# Patient Record
Sex: Female | Born: 1995 | Race: Black or African American | Hispanic: No | Marital: Single | Smoking: Former smoker
Health system: Southern US, Community
[De-identification: ages and names within clinical notes are randomized; demographics above are authoritative.]

## PROBLEM LIST (undated history)

## (undated) ENCOUNTER — Inpatient Hospital Stay (HOSPITAL_COMMUNITY): Payer: Self-pay

## (undated) DIAGNOSIS — F32A Depression, unspecified: Secondary | ICD-10-CM

## (undated) DIAGNOSIS — E039 Hypothyroidism, unspecified: Secondary | ICD-10-CM

## (undated) DIAGNOSIS — F419 Anxiety disorder, unspecified: Secondary | ICD-10-CM

## (undated) DIAGNOSIS — G5600 Carpal tunnel syndrome, unspecified upper limb: Secondary | ICD-10-CM

## (undated) DIAGNOSIS — B9689 Other specified bacterial agents as the cause of diseases classified elsewhere: Secondary | ICD-10-CM

## (undated) DIAGNOSIS — O24419 Gestational diabetes mellitus in pregnancy, unspecified control: Secondary | ICD-10-CM

## (undated) DIAGNOSIS — N760 Acute vaginitis: Secondary | ICD-10-CM

## (undated) DIAGNOSIS — L02411 Cutaneous abscess of right axilla: Secondary | ICD-10-CM

## (undated) DIAGNOSIS — B009 Herpesviral infection, unspecified: Secondary | ICD-10-CM

## (undated) DIAGNOSIS — Z8639 Personal history of other endocrine, nutritional and metabolic disease: Secondary | ICD-10-CM

## (undated) HISTORY — DX: Hypothyroidism, unspecified: E03.9

## (undated) HISTORY — DX: Herpesviral infection, unspecified: B00.9

## (undated) HISTORY — DX: Depression, unspecified: F32.A

## (undated) HISTORY — DX: Anxiety disorder, unspecified: F41.9

## (undated) HISTORY — DX: Gestational diabetes mellitus in pregnancy, unspecified control: O24.419

## (undated) HISTORY — DX: Other specified bacterial agents as the cause of diseases classified elsewhere: N76.0

## (undated) HISTORY — PX: NO PAST SURGERIES: SHX2092

## (undated) HISTORY — DX: Other specified bacterial agents as the cause of diseases classified elsewhere: B96.89

## (undated) HISTORY — DX: Cutaneous abscess of right axilla: L02.411

---
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
## 2015-08-03 ENCOUNTER — Encounter (HOSPITAL_COMMUNITY): Payer: Self-pay

## 2015-08-03 DIAGNOSIS — Y999 Unspecified external cause status: Secondary | ICD-10-CM | POA: Insufficient documentation

## 2015-08-03 DIAGNOSIS — S50812A Abrasion of left forearm, initial encounter: Secondary | ICD-10-CM | POA: Insufficient documentation

## 2015-08-03 DIAGNOSIS — Y929 Unspecified place or not applicable: Secondary | ICD-10-CM | POA: Insufficient documentation

## 2015-08-03 DIAGNOSIS — S60511A Abrasion of right hand, initial encounter: Secondary | ICD-10-CM | POA: Diagnosis not present

## 2015-08-03 DIAGNOSIS — Z87891 Personal history of nicotine dependence: Secondary | ICD-10-CM | POA: Diagnosis not present

## 2015-08-03 DIAGNOSIS — Y939 Activity, unspecified: Secondary | ICD-10-CM | POA: Diagnosis not present

## 2015-08-03 DIAGNOSIS — Z3A14 14 weeks gestation of pregnancy: Secondary | ICD-10-CM | POA: Diagnosis not present

## 2015-08-03 DIAGNOSIS — O9A212 Injury, poisoning and certain other consequences of external causes complicating pregnancy, second trimester: Secondary | ICD-10-CM | POA: Diagnosis present
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
## 2015-08-04 ENCOUNTER — Emergency Department (HOSPITAL_COMMUNITY)
Admission: EM | Admit: 2015-08-04 | Discharge: 2015-08-04 | Disposition: A | Discharge status: Home / Self Care | Payer: Medicaid Other | Attending: Emergency Medicine | Admitting: Emergency Medicine

## 2015-08-04 DIAGNOSIS — Z3492 Encounter for supervision of normal pregnancy, unspecified, second trimester: Secondary | ICD-10-CM
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
## 2015-08-04 NOTE — ED Notes (Signed)
EDP at bedside  

## 2016-01-18 DIAGNOSIS — O365990 Maternal care for other known or suspected poor fetal growth, unspecified trimester, not applicable or unspecified: Secondary | ICD-10-CM

## 2016-02-25 NOTE — L&D Delivery Note (Signed)
Delivery Note At 6:15 PM a viable female was delivered via Vaginal, Spontaneous Delivery (Presentation:LOA ; cephalic ).  APGAR: 8, 9; weight  pending.   Placenta status: complete , 3 vessel cord .  Cord:  with the following complications: none.  Cord pH: pending  Anesthesia:  IV pain meds Episiotomy: None Lacerations: Perineal Suture Repair: 3.0 vicryl Est. Blood Loss (mL): 300  Mom to postpartum.  Baby to Couplet care / Skin to Skin.  Dyke Maes Cimolino PA-S 12/04/2016, 7:43 PM  VAGINAL DELIVERY ATTESTATION  I was gloved and present for the delivery in its entirety, and I agree with the above resident's note.    Raelyn Mora, CNM 7:45 PM

## 2016-06-10 ENCOUNTER — Encounter (HOSPITAL_COMMUNITY): Payer: Self-pay

## 2016-06-10 ENCOUNTER — Inpatient Hospital Stay (HOSPITAL_COMMUNITY): Payer: Medicaid Other

## 2016-06-10 ENCOUNTER — Inpatient Hospital Stay (HOSPITAL_COMMUNITY)
Admission: AD | Admit: 2016-06-10 | Discharge: 2016-06-10 | Disposition: A | Discharge status: Home / Self Care | Payer: Medicaid Other | Source: Ambulatory Visit | Attending: Family Medicine | Admitting: Family Medicine

## 2016-06-10 DIAGNOSIS — Z3A13 13 weeks gestation of pregnancy: Secondary | ICD-10-CM

## 2016-06-10 DIAGNOSIS — O23591 Infection of other part of genital tract in pregnancy, first trimester: Secondary | ICD-10-CM | POA: Diagnosis not present

## 2016-06-10 DIAGNOSIS — B9689 Other specified bacterial agents as the cause of diseases classified elsewhere: Secondary | ICD-10-CM | POA: Insufficient documentation

## 2016-06-10 DIAGNOSIS — N760 Acute vaginitis: Secondary | ICD-10-CM

## 2016-06-10 DIAGNOSIS — O209 Hemorrhage in early pregnancy, unspecified: Secondary | ICD-10-CM | POA: Diagnosis present

## 2016-06-10 HISTORY — DX: Personal history of other endocrine, nutritional and metabolic disease: Z86.39

## 2016-06-10 LAB — URINALYSIS, ROUTINE W REFLEX MICROSCOPIC
BILIRUBIN URINE: NEGATIVE
Glucose, UA: NEGATIVE mg/dL
KETONES UR: NEGATIVE mg/dL
LEUKOCYTES UA: NEGATIVE
Nitrite: NEGATIVE
Protein, ur: NEGATIVE mg/dL
Specific Gravity, Urine: 1.027 (ref 1.005–1.030)
WBC, UA: NONE SEEN WBC/hpf (ref 0–5)
pH: 7 (ref 5.0–8.0)

## 2016-06-10 LAB — WET PREP, GENITAL
SPERM: NONE SEEN
Trich, Wet Prep: NONE SEEN
YEAST WET PREP: NONE SEEN

## 2016-06-10 LAB — POCT PREGNANCY, URINE: PREG TEST UR: POSITIVE — AB

## 2016-06-10 MED ORDER — METRONIDAZOLE 500 MG PO TABS: 500.0000 mg | ORAL_TABLET | Freq: Two times a day (BID) | ORAL | 0 refills | Status: DC

## 2016-06-10 NOTE — Discharge Instructions (Signed)
Bacterial Vaginosis °Bacterial vaginosis is a vaginal infection that occurs when the normal balance of bacteria in the vagina is disrupted. It results from an overgrowth of certain bacteria. This is the most common vaginal infection among women ages 15-44. °Because bacterial vaginosis increases your risk for STIs (sexually transmitted infections), getting treated can help reduce your risk for chlamydia, gonorrhea, herpes, and HIV (human immunodeficiency virus). Treatment is also important for preventing complications in pregnant women, because this condition can cause an early (premature) delivery. °What are the causes? °This condition is caused by an increase in harmful bacteria that are normally present in small amounts in the vagina. However, the reason that the condition develops is not fully understood. °What increases the risk? °The following factors may make you more likely to develop this condition: °· Having a new sexual partner or multiple sexual partners. °· Having unprotected sex. °· Douching. °· Having an intrauterine device (IUD). °· Smoking. °· Drug and alcohol abuse. °· Taking certain antibiotic medicines. °· Being pregnant. °You cannot get bacterial vaginosis from toilet seats, bedding, swimming pools, or contact with objects around you. °What are the signs or symptoms? °Symptoms of this condition include: °· Grey or white vaginal discharge. The discharge can also be watery or foamy. °· A fish-like odor with discharge, especially after sexual intercourse or during menstruation. °· Itching in and around the vagina. °· Burning or pain with urination. °Some women with bacterial vaginosis have no signs or symptoms. °How is this diagnosed? °This condition is diagnosed based on: °· Your medical history. °· A physical exam of the vagina. °· Testing a sample of vaginal fluid under a microscope to look for a large amount of bad bacteria or abnormal cells. Your health care provider may use a cotton swab or a  small wooden spatula to collect the sample. °How is this treated? °This condition is treated with antibiotics. These may be given as a pill, a vaginal cream, or a medicine that is put into the vagina (suppository). If the condition comes back after treatment, a second round of antibiotics may be needed. °Follow these instructions at home: °Medicines  °· Take over-the-counter and prescription medicines only as told by your health care provider. °· Take or use your antibiotic as told by your health care provider. Do not stop taking or using the antibiotic even if you start to feel better. °General instructions  °· If you have a female sexual partner, tell her that you have a vaginal infection. She should see her health care provider and be treated if she has symptoms. If you have a female sexual partner, he does not need treatment. °· During treatment: °¨ Avoid sexual activity until you finish treatment. °¨ Do not douche. °¨ Avoid alcohol as directed by your health care provider. °¨ Avoid breastfeeding as directed by your health care provider. °· Drink enough water and fluids to keep your urine clear or pale yellow. °· Keep the area around your vagina and rectum clean. °¨ Wash the area daily with warm water. °¨ Wipe yourself from front to back after using the toilet. °· Keep all follow-up visits as told by your health care provider. This is important. °How is this prevented? °· Do not douche. °· Wash the outside of your vagina with warm water only. °· Use protection when having sex. This includes latex condoms and dental dams. °· Limit how many sexual partners you have. To help prevent bacterial vaginosis, it is best to have sex with just one   partner (monogamous).  Make sure you and your sexual partner are tested for STIs.  Wear cotton or cotton-lined underwear.  Avoid wearing tight pants and pantyhose, especially during summer.  Limit the amount of alcohol that you drink.  Do not use any products that contain  nicotine or tobacco, such as cigarettes and e-cigarettes. If you need help quitting, ask your health care provider.  Do not use illegal drugs. Where to find more information:  Centers for Disease Control and Prevention: SolutionApps.co.za  American Sexual Health Association (ASHA): www.ashastd.org  U.S. Department of Health and Health and safety inspector, Office on Women's Health: ConventionalMedicines.si or http://www.anderson-williamson.info/ Contact a health care provider if:  Your symptoms do not improve, even after treatment.  You have more discharge or pain when urinating.  You have a fever.  You have pain in your abdomen.  You have pain during sex.  You have vaginal bleeding between periods. Summary  Bacterial vaginosis is a vaginal infection that occurs when the normal balance of bacteria in the vagina is disrupted.  Because bacterial vaginosis increases your risk for STIs (sexually transmitted infections), getting treated can help reduce your risk for chlamydia, gonorrhea, herpes, and HIV (human immunodeficiency virus). Treatment is also important for preventing complications in pregnant women, because the condition can cause an early (premature) delivery.  This condition is treated with antibiotic medicines. These may be given as a pill, a vaginal cream, or a medicine that is put into the vagina (suppository). This information is not intended to replace advice given to you by your health care provider. Make sure you discuss any questions you have with your health care provider. Document Released: 02/10/2005 Document Revised: 10/27/2015 Document Reviewed: 10/27/2015 Elsevier Interactive Patient Education  2017 Elsevier Inc.     Vaginal Bleeding During Pregnancy, First Trimester A small amount of bleeding (spotting) from the vagina is relatively common in early pregnancy. It usually stops on its own. Various things may cause bleeding or spotting in early pregnancy.  Some bleeding may be related to the pregnancy, and some may not. In most cases, the bleeding is normal and is not a problem. However, bleeding can also be a sign of something serious. Be sure to tell your health care provider about any vaginal bleeding right away. Some possible causes of vaginal bleeding during the first trimester include:  Infection or inflammation of the cervix.  Growths (polyps) on the cervix.  Miscarriage or threatened miscarriage.  Pregnancy tissue has developed outside of the uterus and in a fallopian tube (tubal pregnancy).  Tiny cysts have developed in the uterus instead of pregnancy tissue (molar pregnancy). Follow these instructions at home: Watch your condition for any changes. The following actions may help to lessen any discomfort you are feeling:  Follow your health care provider's instructions for limiting your activity. If your health care provider orders bed rest, you may need to stay in bed and only get up to use the bathroom. However, your health care provider may allow you to continue light activity.  If needed, make plans for someone to help with your regular activities and responsibilities while you are on bed rest.  Keep track of the number of pads you use each day, how often you change pads, and how soaked (saturated) they are. Write this down.  Do not use tampons. Do not douche.  Do not have sexual intercourse or orgasms until approved by your health care provider.  If you pass any tissue from your vagina, save the tissue so you  can show it to your health care provider.  Only take over-the-counter or prescription medicines as directed by your health care provider.  Do not take aspirin because it can make you bleed.  Keep all follow-up appointments as directed by your health care provider. Contact a health care provider if:  You have any vaginal bleeding during any part of your pregnancy.  You have cramps or labor pains.  You have a  fever, not controlled by medicine. Get help right away if:  You have severe cramps in your back or belly (abdomen).  You pass large clots or tissue from your vagina.  Your bleeding increases.  You feel light-headed or weak, or you have fainting episodes.  You have chills.  You are leaking fluid or have a gush of fluid from your vagina.  You pass out while having a bowel movement. This information is not intended to replace advice given to you by your health care provider. Make sure you discuss any questions you have with your health care provider. Document Released: 11/20/2004 Document Revised: 07/19/2015 Document Reviewed: 10/18/2012 Elsevier Interactive Patient Education  2017 [employer].

## 2016-06-10 NOTE — MAU Note (Addendum)
Pt has been having pink or reddish bleeding on and off. Has also been having pelvic pain but currently not having pain. Pt reports being pregnant and having a baby 4 months ago.

## 2016-06-10 NOTE — MAU Provider Note (Signed)
History     CSN: [id number]  Arrival date and time: 06/10/16 4010  First Provider Initiated Contact with Patient 06/10/16 1033   Chief Complaint  Patient presents with  . Vaginal Bleeding   HPI  Alyssa Hartman is a 21 y.o. G2P1001 at [redacted]w[redacted]d by LMP who presents with vaginal bleeding & abdominal pain. Symptoms began 2 days ago. Reports pelvic pain that has occurred twice in the last 2 days and lasted for seconds at a time. Both times pain occurred when she was trying to get out of bed. Currently no pain.  Has noted pink/red spotting on toilet paper & has seen 2 pea-sized blood clots since yesterday. Some nausea. Denies vomiting, diarrhea, constipation, dysuria, or recent intercourse.  Has initial OB appointment at [facility] 5/10.   OB History    Gravida Para Term Preterm AB Living   0   1   SAB TAB Ectopic Multiple Live Births           1      Past Medical History:  Diagnosis Date  . Hx of Hashimoto thyroiditis     Past Surgical History:  Procedure Laterality Date  . NO PAST SURGERIES      History reviewed. No pertinent family history.  Social History  Substance Use Topics  . Smoking status: Former Games developer  . Smokeless tobacco: Never Used  . Alcohol use No    Allergies: No Known Allergies  No prescriptions prior to admission.    Review of Systems  Constitutional: Negative.   Gastrointestinal: Positive for abdominal pain and nausea. Negative for constipation, diarrhea and vomiting.  Genitourinary: Positive for vaginal bleeding and vaginal discharge. Negative for dyspareunia and dysuria.   Physical Exam   Blood pressure 115/69, pulse 93, temperature 98.7 F (37.1 C), resp. rate 18, height  (1.575 m), weight 230 lb (104.3 kg), last menstrual period 03/07/2016, unknown if currently breastfeeding.  Physical Exam  Nursing note and vitals reviewed. Constitutional: She is oriented to person, place, and time. She appears well-developed and well-nourished. No  distress.  HENT:  Head: Normocephalic and atraumatic.  Eyes: Conjunctivae are normal. Right eye exhibits no discharge. Left eye exhibits no discharge. No scleral icterus.  Neck: Normal range of motion.  Cardiovascular: Normal rate, regular rhythm and normal heart sounds.   No murmur heard. Respiratory: Effort normal and breath sounds normal. No respiratory distress. She has no wheezes.  GI: Soft. Bowel sounds are normal. She exhibits no distension. There is no tenderness. There is no rebound and no guarding.  Genitourinary: Uterus is enlarged. Cervix exhibits no friability. There is bleeding (minimal amount of dark red blood) in the vagina.  Genitourinary Comments: Cervix closed  Neurological: She is alert and oriented to person, place, and time.  Skin: Skin is warm and dry. She is not diaphoretic.  Psychiatric: She has a normal mood and affect. Her behavior is normal. Judgment and thought content normal.    MAU Course  Procedures Results for orders placed or performed during the hospital encounter of 06/10/16 (from the past 24 hour(s))  Urinalysis, Routine w reflex microscopic (not at [facility])     Status: Abnormal   Collection Time: 06/10/16 10:05 AM  Result Value Ref Range   Color, Urine YELLOW YELLOW   APPearance HAZY (A) CLEAR   Specific Gravity, Urine 1.027 1.005 - 1.030   pH 7.0 5.0 - 8.0   Glucose, UA NEGATIVE NEGATIVE mg/dL   Hgb urine dipstick SMALL (A) NEGATIVE  Bilirubin Urine NEGATIVE NEGATIVE   Ketones, ur NEGATIVE NEGATIVE mg/dL   Protein, ur NEGATIVE NEGATIVE mg/dL   Nitrite NEGATIVE NEGATIVE   Leukocytes, UA NEGATIVE NEGATIVE   RBC / HPF 0-5 0 - 5 RBC/hpf   WBC, UA NONE SEEN 0 - 5 WBC/hpf   Bacteria, UA RARE (A) NONE SEEN   Squamous Epithelial / LPF 6-30 (A) NONE SEEN   Mucous PRESENT   Pregnancy, urine POC     Status: Abnormal   Collection Time: 06/10/16 10:14 AM  Result Value Ref Range   Preg Test, Ur POSITIVE (A) NEGATIVE  Wet prep, genital     Status:  Abnormal   Collection Time: 06/10/16 10:44 AM  Result Value Ref Range   Yeast Wet Prep HPF POC NONE SEEN NONE SEEN   Trich, Wet Prep NONE SEEN NONE SEEN   Clue Cells Wet Prep HPF POC PRESENT (A) NONE SEEN   WBC, Wet Prep HPF POC MANY (A) NONE SEEN   Sperm NONE SEEN    No results found.   MDM FHT 158 A positive (per care everywhere) GC/CT & wet prep Ultrasound -- no SCH  Assessment and Plan  A: 1. BV (bacterial vaginosis)   2. [redacted] weeks gestation of pregnancy   3. Vaginal bleeding in pregnancy, first trimester    P: Discharge home Rx flagyl Pelvic rest Discussed reasons to return to MAU Keep follow up appointment with OB/PCP  GC/CT pending   Judeth Horn 06/10/2016, 10:32 AM

## 2016-06-11 LAB — GC/CHLAMYDIA PROBE AMP (~~LOC~~) NOT AT ARMC
Chlamydia: NEGATIVE
Neisseria Gonorrhea: NEGATIVE

## 2016-07-07 LAB — OB RESULTS CONSOLE GC/CHLAMYDIA
CHLAMYDIA, DNA PROBE: NEGATIVE
GC PROBE AMP, GENITAL: NEGATIVE

## 2016-07-07 LAB — OB RESULTS CONSOLE HIV ANTIBODY (ROUTINE TESTING): HIV: NONREACTIVE

## 2016-07-07 LAB — OB RESULTS CONSOLE RUBELLA ANTIBODY, IGM: Rubella: IMMUNE

## 2016-07-07 LAB — OB RESULTS CONSOLE ABO/RH: RH Type: POSITIVE

## 2016-07-07 LAB — OB RESULTS CONSOLE HEPATITIS B SURFACE ANTIGEN: Hepatitis B Surface Ag: NEGATIVE

## 2016-07-07 LAB — OB RESULTS CONSOLE HGB/HCT, BLOOD
HCT: 35 %
HEMOGLOBIN: 11.2 g/dL

## 2016-07-07 LAB — OB RESULTS CONSOLE RPR: RPR: NONREACTIVE

## 2016-07-07 LAB — OB RESULTS CONSOLE PLATELET COUNT: Platelets: 206 10*3/uL

## 2016-07-07 LAB — OB RESULTS CONSOLE TSH: TSH: 2.66

## 2016-07-07 LAB — OB RESULTS CONSOLE ANTIBODY SCREEN: ANTIBODY SCREEN: NEGATIVE

## 2016-07-07 LAB — OB RESULTS CONSOLE VARICELLA ZOSTER ANTIBODY, IGG: VARICELLA IGG: IMMUNE

## 2016-07-08 ENCOUNTER — Other Ambulatory Visit (HOSPITAL_COMMUNITY): Payer: Self-pay | Admitting: Nurse Practitioner

## 2016-07-08 DIAGNOSIS — Z3689 Encounter for other specified antenatal screening: Secondary | ICD-10-CM

## 2016-07-22 ENCOUNTER — Other Ambulatory Visit (HOSPITAL_COMMUNITY): Payer: Self-pay | Admitting: *Deleted

## 2016-07-22 ENCOUNTER — Ambulatory Visit (HOSPITAL_COMMUNITY)
Admission: RE | Admit: 2016-07-22 | Discharge: 2016-07-22 | Disposition: A | Discharge status: Home / Self Care | Payer: Medicaid Other | Source: Ambulatory Visit | Attending: Nurse Practitioner | Admitting: Nurse Practitioner

## 2016-07-22 ENCOUNTER — Encounter (HOSPITAL_COMMUNITY): Payer: Self-pay

## 2016-07-22 DIAGNOSIS — Z3689 Encounter for other specified antenatal screening: Secondary | ICD-10-CM | POA: Diagnosis present

## 2016-07-22 DIAGNOSIS — O99212 Obesity complicating pregnancy, second trimester: Secondary | ICD-10-CM | POA: Diagnosis not present

## 2016-07-22 DIAGNOSIS — O09299 Supervision of pregnancy with other poor reproductive or obstetric history, unspecified trimester: Secondary | ICD-10-CM | POA: Insufficient documentation

## 2016-07-22 DIAGNOSIS — O352XX0 Maternal care for (suspected) hereditary disease in fetus, not applicable or unspecified: Secondary | ICD-10-CM | POA: Insufficient documentation

## 2016-07-22 DIAGNOSIS — O09892 Supervision of other high risk pregnancies, second trimester: Secondary | ICD-10-CM | POA: Insufficient documentation

## 2016-07-22 DIAGNOSIS — Z363 Encounter for antenatal screening for malformations: Secondary | ICD-10-CM | POA: Diagnosis not present

## 2016-07-22 DIAGNOSIS — Z3A19 19 weeks gestation of pregnancy: Secondary | ICD-10-CM | POA: Insufficient documentation

## 2016-07-22 DIAGNOSIS — Z8489 Family history of other specified conditions: Secondary | ICD-10-CM | POA: Insufficient documentation

## 2016-07-22 NOTE — Progress Notes (Signed)
Genetic Counseling  High-Risk Gestation Note  Appointment Date:  07/22/2016 Referred By: Alyssa Bloom, NP Date of Birth:  Sep 06, 1995 Partner:  Alyssa Hartman   Pregnancy History: [id number] Estimated Date of Delivery: 12/12/16 Estimated Gestational Age: 18w4dAttending: MGriffin Dakin MD   I met with [name] genetic counseling because of a family history of Branchioororenal (BOR) syndrome. [name] [facility] and was seen for genetic counseling during that pregnancy on 10/16/15 and 10/23/15 given the ultrasound finding in that pregnancy and the reported family history.   In summary:  Discussed family history of reported BOR syndrome  Father of the baby and couple's son both with features possibly consistent with BOR syndrome  [place]'s [place] [facility] Reviewed autosomal dominant inheritance; 1 in 2 (50%) recurrence risk  Discussed options of screening / testing  Detailed ultrasound- performed today  Amniocentesis- patient declined; would not be informative unless molecular testing first identified pathogenic change for affected family members  Postnatal evaluation for associated features of BOR  Discussed general population carrier screening options-declined  CF  SMA  Hemoglobinopathies  We began by reviewing and updating the family history. The father of the pregnancy, Alyssa Hartman was previously reported to have branchio-oto-renal syndrome per his mother's report. This information was previously discussed and was not reviewed in detail today. The patient reported no changes to the medical history for [name] for herself. Ms. AGwen Herreported that their son, Alyssa Hartman is currently 660 monthsold. The patient reported that he was noted at birth to have branchial cleft and is planned to have surgical closure. He also  reportedly has preauricular pits and is followed by urology. [name] that he did not score a response on his newborn hearing screening, but she had limited information regarding this at this time. Additional available medical documentation indicated that [facility] for genes associated with BOR syndrome; results are currently pending.  The patient reported no additional updates to either family history.    We reviewed that branchiootorenal spectrum disorders are described to include branchiootorenal (BOR) syndrome and branchiootic syndrome (BOS). BOR syndrome is characterized by branchial fistulae and cysts in ~50% of individuals, otologic findings in 90% (deafness, preauricular pits, auricular malformations, abnormalities of external auditory canal, middle ear and inner ear abnormalities), and renal anomalies (renal agenesis, hypoplasia, dysplasia; UPJ obstruction; calyceal cyst/diverticulum; hydronephrosis; vesicoureteral reflux). BOS is considered in the absence of structural renal anomalies. BOR syndrome is diagnosed based on clinical criteria, and a diagnosis is based on the presence of three or more major criteria or two major and two minor clinical criteria (Radene Kneeet al. 2004). For individuals with an affected family member, the presence of one major clinical criteria is considered sufficient to make a clinical diagnosis. At-risk relatives (first degree relatives of affected individuals) should be screened for hearing loss and renal involvement to allow for early diagnosis and treatment. Management includes excision of branchial cleft cysts or fistulae if present and treatment of urologic and renal abnormalities. For otologic anomalies, fitting with appropriate aural habilitation and appropriate educational program for the hearing impaired, as well as considering canaloplasty to correct atretic canal.  In the presence of renal anomalies, an increased risk for end-stage renal disease (ESRD) has been described, and individuals are routinely monitored  for signs of ESRD.   We spent time reviewing genes, chromosomes, and autosomal dominant inheritance, given that BOR syndrome follows autosomal dominant inheritance. In autosomal dominant inheritance, a nonworking gene variant in a pair causes the particular condition. Each offspring of an affected individual has a 1 in 2 (50%) chance to inherit the autosomal dominant condition. In BOR syndrome, 100% penetrance is reported, meaning individuals who inherit the nonworking gene variant would be expected to have the condition. However, variable expressivity is observed, meaning that the exact symptoms and features of the condition can range, often among family members with BOR syndrome. Thus, the presence of features in one relative with BOR syndrome would not necessarily predict the presentation of features in another relatives with BOR syndrome. There are currently three genes identified that can be causative of BOR syndrome. Clinical molecular testing is available for these genes. However, we discussed that not all individuals with a clinical diagnosis of BOR syndrome have identified genetic causes in these genes. EYA1 gene is estimated to account for 40% of individual with BOR syndrome, SIX5 accounts for approximately 2.5%, and SIX1 gene accounts for approximately 2%. Thus, the molecular cause for BOR syndrome in approximately half of individuals with a clinical diagnosis is not currently known. If a molecular cause was identified in an individual, genetic testing could be performed for at risk relatives, and in the case of a pregnancy, prenatal diagnosis. We reviewed the risks, benefits, and limitations of amniocentesis, including the associated 1 in [phone] risk for complications. Genetic testing has not been performed for Alyssa Hartman, to the couple's knowledge.  Prenatal molecular testing via amniocentesis would not be expected to be informative unless molecular testing in Alyssa Hartman and/or Alyssa Hartman was first performed and identified a causative gene change. Alyssa Hartman stated that she would not be interested in pursuing amniocentesis in a pregnancy, even if a molecular cause were to be identified for Alyssa Hartman and/or Mr. Alyssa Hartman condition.   We discussed the option of targeted ultrasound to assess fetal anatomy, and specifically fetal kidneys in detail. Ultrasound was performed today. Complete ultrasound result under separate cover. She understands that ultrasound cannot diagnose or rule out all genetic conditions.   We reviewed that [place] is typically diagnosed on clinical criteria. In the case of an affected family member, the presence of one major clinical criteria is considered to meet clinical criteria for diagnosis. Given the reported family history, recurrence risk for BOR in the current pregnancy would be 1 in 2 (50%); this would also be in the recurrence risk in the case that a different underlying autosomal dominant condition is the cause for their features. In the case of a different underlying etiology, recurrence risk estimate and possible associated medical management for affected individuals may change. The patient is aware of the option of medical genetics evaluation for this baby postnatally, as well as postnatal subspecialty evaluations, if warranted, such as peds urology, pediatric surgery, and audiology. Without further information regarding the provided family history, an accurate genetic risk cannot be calculated. Further genetic counseling is warranted if more information is obtained.  Alyssa Hartman was provided with written information regarding cystic fibrosis (CF), spinal muscular atrophy (SMA) and hemoglobinopathies including the carrier frequency, availability of carrier screening and prenatal diagnosis if indicated.  In  addition, we discussed that CF and hemoglobinopathies are routinely screened for as part of the  newborn screening panel.  She declined screening for CF, SMA and hemoglobinopathies.  Alyssa Hartman denied  exposure to environmental toxins or chemical agents. She denied the use of alcohol, tobacco or street drugs. She denied significant viral illnesses during the course of Hartman pregnancy. Hartman medical and surgical histories were contributory for bipolar disorder and attention deficit hyperactivity disorder (ADHD), and she reported that she has not required medication for treatment of these conditions during pregnancy.   I counseled Alyssa Hartman regarding the above risks and available options.  The approximate face-to-face time with the genetic counselor was 25 minutes.  Chipper Oman, MS Certified Genetic Counselor 07/22/2016

## 2016-07-22 NOTE — Addendum Note (Signed)
Encounter addended by: Levonne HubertStalter, Jayel Scaduto M on: 07/22/2016 11:31 AM<BR>    Actions taken: Imaging Exam ended

## 2016-07-24 ENCOUNTER — Encounter: Payer: Self-pay | Admitting: *Deleted

## 2016-07-28 ENCOUNTER — Ambulatory Visit (INDEPENDENT_AMBULATORY_CARE_PROVIDER_SITE_OTHER): Payer: Medicaid Other | Admitting: Obstetrics & Gynecology

## 2016-07-28 ENCOUNTER — Encounter: Payer: Self-pay | Admitting: Obstetrics & Gynecology

## 2016-07-28 VITALS — BP 121/59 | HR 92 | Wt 236.6 lb

## 2016-07-28 DIAGNOSIS — O99212 Obesity complicating pregnancy, second trimester: Secondary | ICD-10-CM | POA: Diagnosis not present

## 2016-07-28 DIAGNOSIS — O359XX0 Maternal care for (suspected) fetal abnormality and damage, unspecified, not applicable or unspecified: Secondary | ICD-10-CM

## 2016-07-28 DIAGNOSIS — E063 Autoimmune thyroiditis: Secondary | ICD-10-CM

## 2016-07-28 DIAGNOSIS — F319 Bipolar disorder, unspecified: Secondary | ICD-10-CM

## 2016-07-28 DIAGNOSIS — O0992 Supervision of high risk pregnancy, unspecified, second trimester: Secondary | ICD-10-CM

## 2016-07-28 DIAGNOSIS — Z6841 Body Mass Index (BMI) 40.0 and over, adult: Secondary | ICD-10-CM | POA: Diagnosis not present

## 2016-07-28 DIAGNOSIS — O0990 Supervision of high risk pregnancy, unspecified, unspecified trimester: Secondary | ICD-10-CM | POA: Insufficient documentation

## 2016-07-28 LAB — POCT URINALYSIS DIP (DEVICE)
Bilirubin Urine: NEGATIVE
GLUCOSE, UA: NEGATIVE mg/dL
Hgb urine dipstick: NEGATIVE
Ketones, ur: NEGATIVE mg/dL
LEUKOCYTES UA: NEGATIVE
NITRITE: NEGATIVE
PROTEIN: 30 mg/dL — AB
Specific Gravity, Urine: >=1.03 (ref 1.005–1.030)
UROBILINOGEN UA: 0.2 mg/dL (ref 0.0–1.0)
pH: 6.5 (ref 5.0–8.0)

## 2016-07-28 NOTE — Progress Notes (Signed)
Patient compain of having Cramps in hips and thigh off and on every other day, pain at a level 7. Patient PHQ-9 is high refused to see Alyssa Hartman she already see a Veterinary surgeoncounselor.    PRENATAL VISIT NOTE  Subjective:  Alyssa Hartman is a 21 y.o. G3P1001 at Unknown being seen today for ongoing prenatal care.  She is currently monitored for the following issues for this high-risk pregnancy and has Supervision of high risk pregnancy in second trimester; Bipolar 1 disorder (HCC); Hashimoto's thyroiditis; Obesity; and Suspected abnormality of fetus affecting management of mother in singleton pregnancy on her problem list.  Patient reports hip pain adn hard to get comfortable at night.  Contractions: Not present. Vag. Bleeding: None.  Movement: Present. Denies leaking of fluid.   The following portions of the patient's history were reviewed and updated as appropriate: allergies, current medications, past family history, past medical history, past social history, past surgical history and problem list. Problem list updated.  Objective:   Vitals:   07/28/16 0940  BP: (!) 121/59  Pulse: 92  Weight: 236 lb 9.6 oz (107.3 kg)    Fetal Status: Fetal Heart Rate (bpm): 150   Movement: Present     General:  Alert, oriented and cooperative. Patient is in no acute distress.  Skin: Skin is warm and dry. No rash noted.   Cardiovascular: Normal heart rate noted  Respiratory: Normal respiratory effort, no problems with respiration noted  Abdomen: Soft, gravid, appropriate for gestational age. Pain/Pressure: Present     Pelvic:  Cervical exam deferred        Extremities: Normal range of motion.  Edema: Trace  Mental Status: Normal mood and affect. Normal behavior. Normal judgment and thought content.   Assessment and Plan:  Pregnancy: G3P1001 at Unknown  1. Supervision of high risk pregnancy in second trimester -[facility]  2. Hashimoto's thyroiditis TSH in May  was nml (hig hnml); will recheck TSH in July 2018; pt not any med.  3. Class 3 severe obesity due to excess calories without serious comorbidity with body mass index (BMI) of 40.0 to 44.9 in adult [facility](HCC) -early one hour nml -Saw nutritionist at [facility] -12-20 pound weight gain  4. Suspected abnormality of fetus affecting management of mother in singleton pregnancy -BOS of FOB and first baby -Refuses genetic testing -Monitor for IUGR  5. Bipolar 1 disorder (HCC) -Sees counselor (refuses to see Alyssa Hartman today) -Not on meds -No HI or SI -Has open CPS case with first baby  Preterm labor symptoms and general obstetric precautions including but not limited to vaginal bleeding, contractions, leaking of fluid and fetal movement were reviewed in detail with the patient. Please refer to After Visit Summary for other counseling recommendations.  Return in about 3 weeks (around 08/18/2016).  30 minutes spent face to face with patient with >50% counseling  Alyssa LincolnKelly Leggett, MD

## 2016-08-04 ENCOUNTER — Encounter: Payer: Self-pay | Admitting: Obstetrics and Gynecology

## 2016-08-04 DIAGNOSIS — Z87898 Personal history of other specified conditions: Secondary | ICD-10-CM | POA: Insufficient documentation

## 2016-08-19 ENCOUNTER — Ambulatory Visit (HOSPITAL_COMMUNITY)
Admission: RE | Admit: 2016-08-19 | Discharge: 2016-08-19 | Disposition: A | Discharge status: Home / Self Care | Payer: Medicaid Other | Source: Ambulatory Visit | Attending: Nurse Practitioner | Admitting: Nurse Practitioner

## 2016-08-19 ENCOUNTER — Encounter: Payer: Medicaid Other | Admitting: Obstetrics and Gynecology

## 2016-08-22 ENCOUNTER — Encounter (HOSPITAL_COMMUNITY): Payer: Self-pay

## 2016-08-22 ENCOUNTER — Ambulatory Visit (HOSPITAL_COMMUNITY)
Admission: RE | Admit: 2016-08-22 | Discharge: 2016-08-22 | Disposition: A | Discharge status: Home / Self Care | Payer: Medicaid Other | Source: Ambulatory Visit | Attending: Nurse Practitioner | Admitting: Nurse Practitioner

## 2016-08-22 ENCOUNTER — Other Ambulatory Visit (HOSPITAL_COMMUNITY): Payer: Self-pay | Admitting: *Deleted

## 2016-08-22 ENCOUNTER — Other Ambulatory Visit (HOSPITAL_COMMUNITY): Payer: Self-pay | Admitting: Obstetrics and Gynecology

## 2016-08-22 DIAGNOSIS — Z3A24 24 weeks gestation of pregnancy: Secondary | ICD-10-CM | POA: Diagnosis not present

## 2016-08-22 DIAGNOSIS — O09299 Supervision of pregnancy with other poor reproductive or obstetric history, unspecified trimester: Secondary | ICD-10-CM

## 2016-08-22 DIAGNOSIS — O321XX0 Maternal care for breech presentation, not applicable or unspecified: Secondary | ICD-10-CM | POA: Insufficient documentation

## 2016-08-22 DIAGNOSIS — Z8489 Family history of other specified conditions: Secondary | ICD-10-CM | POA: Insufficient documentation

## 2016-08-22 DIAGNOSIS — Z3A23 23 weeks gestation of pregnancy: Secondary | ICD-10-CM

## 2016-08-22 DIAGNOSIS — O09292 Supervision of pregnancy with other poor reproductive or obstetric history, second trimester: Secondary | ICD-10-CM | POA: Diagnosis present

## 2016-08-22 DIAGNOSIS — O365990 Maternal care for other known or suspected poor fetal growth, unspecified trimester, not applicable or unspecified: Secondary | ICD-10-CM

## 2016-08-22 NOTE — Addendum Note (Signed)
Encounter addended by: Vivien RotaSmall, Sharne Linders H, RT on: 08/22/2016  2:52 PM<BR>    Actions taken: Imaging Exam ended

## 2016-08-22 NOTE — Addendum Note (Signed)
Encounter addended by: Levonne HubertStalter, Ivyrose Hashman M on: 08/22/2016  2:52 PM<BR>    Actions taken: Imaging Exam ended

## 2016-09-05 ENCOUNTER — Encounter (HOSPITAL_COMMUNITY): Payer: Self-pay

## 2016-09-05 ENCOUNTER — Ambulatory Visit (HOSPITAL_COMMUNITY)
Admission: RE | Admit: 2016-09-05 | Discharge: 2016-09-05 | Disposition: A | Discharge status: Home / Self Care | Payer: Medicaid Other | Source: Ambulatory Visit | Attending: Obstetrics and Gynecology | Admitting: Obstetrics and Gynecology

## 2016-09-05 ENCOUNTER — Other Ambulatory Visit (HOSPITAL_COMMUNITY): Payer: Self-pay | Admitting: Obstetrics and Gynecology

## 2016-09-05 DIAGNOSIS — O99212 Obesity complicating pregnancy, second trimester: Secondary | ICD-10-CM

## 2016-09-05 DIAGNOSIS — Z3A26 26 weeks gestation of pregnancy: Secondary | ICD-10-CM | POA: Diagnosis not present

## 2016-09-05 DIAGNOSIS — O365990 Maternal care for other known or suspected poor fetal growth, unspecified trimester, not applicable or unspecified: Secondary | ICD-10-CM

## 2016-09-05 DIAGNOSIS — E079 Disorder of thyroid, unspecified: Secondary | ICD-10-CM | POA: Insufficient documentation

## 2016-09-05 DIAGNOSIS — Z8489 Family history of other specified conditions: Secondary | ICD-10-CM | POA: Diagnosis not present

## 2016-09-05 DIAGNOSIS — Z362 Encounter for other antenatal screening follow-up: Secondary | ICD-10-CM | POA: Diagnosis not present

## 2016-09-05 DIAGNOSIS — O99282 Endocrine, nutritional and metabolic diseases complicating pregnancy, second trimester: Secondary | ICD-10-CM | POA: Diagnosis not present

## 2016-09-05 DIAGNOSIS — O09292 Supervision of pregnancy with other poor reproductive or obstetric history, second trimester: Secondary | ICD-10-CM | POA: Diagnosis not present

## 2016-09-05 DIAGNOSIS — O09892 Supervision of other high risk pregnancies, second trimester: Secondary | ICD-10-CM

## 2016-09-05 DIAGNOSIS — O352XX0 Maternal care for (suspected) hereditary disease in fetus, not applicable or unspecified: Secondary | ICD-10-CM | POA: Insufficient documentation

## 2016-09-08 ENCOUNTER — Other Ambulatory Visit (HOSPITAL_COMMUNITY): Payer: Self-pay | Admitting: *Deleted

## 2016-09-08 DIAGNOSIS — O365990 Maternal care for other known or suspected poor fetal growth, unspecified trimester, not applicable or unspecified: Secondary | ICD-10-CM

## 2016-09-11 ENCOUNTER — Other Ambulatory Visit (HOSPITAL_COMMUNITY): Payer: Self-pay

## 2016-09-12 ENCOUNTER — Encounter (HOSPITAL_COMMUNITY): Payer: Self-pay

## 2016-09-12 ENCOUNTER — Other Ambulatory Visit (HOSPITAL_COMMUNITY): Payer: Self-pay | Admitting: Maternal and Fetal Medicine

## 2016-09-12 ENCOUNTER — Ambulatory Visit (HOSPITAL_COMMUNITY)
Admission: RE | Admit: 2016-09-12 | Discharge: 2016-09-12 | Disposition: A | Discharge status: Home / Self Care | Payer: Medicaid Other | Source: Ambulatory Visit | Attending: Obstetrics and Gynecology | Admitting: Obstetrics and Gynecology

## 2016-09-12 ENCOUNTER — Other Ambulatory Visit (HOSPITAL_COMMUNITY): Payer: Self-pay | Admitting: *Deleted

## 2016-09-12 DIAGNOSIS — E079 Disorder of thyroid, unspecified: Secondary | ICD-10-CM | POA: Insufficient documentation

## 2016-09-12 DIAGNOSIS — O352XX0 Maternal care for (suspected) hereditary disease in fetus, not applicable or unspecified: Secondary | ICD-10-CM | POA: Insufficient documentation

## 2016-09-12 DIAGNOSIS — Z3A27 27 weeks gestation of pregnancy: Secondary | ICD-10-CM

## 2016-09-12 DIAGNOSIS — O99212 Obesity complicating pregnancy, second trimester: Secondary | ICD-10-CM | POA: Diagnosis not present

## 2016-09-12 DIAGNOSIS — Z8489 Family history of other specified conditions: Secondary | ICD-10-CM | POA: Insufficient documentation

## 2016-09-12 DIAGNOSIS — O09292 Supervision of pregnancy with other poor reproductive or obstetric history, second trimester: Secondary | ICD-10-CM | POA: Insufficient documentation

## 2016-09-12 DIAGNOSIS — O365990 Maternal care for other known or suspected poor fetal growth, unspecified trimester, not applicable or unspecified: Secondary | ICD-10-CM

## 2016-09-12 DIAGNOSIS — O99282 Endocrine, nutritional and metabolic diseases complicating pregnancy, second trimester: Secondary | ICD-10-CM | POA: Insufficient documentation

## 2016-09-12 DIAGNOSIS — O365920 Maternal care for other known or suspected poor fetal growth, second trimester, not applicable or unspecified: Secondary | ICD-10-CM | POA: Insufficient documentation

## 2016-09-17 ENCOUNTER — Ambulatory Visit (INDEPENDENT_AMBULATORY_CARE_PROVIDER_SITE_OTHER): Payer: Medicaid Other | Admitting: Family Medicine

## 2016-09-17 ENCOUNTER — Encounter: Payer: Self-pay | Admitting: Family Medicine

## 2016-09-17 VITALS — BP 103/52 | HR 89 | Wt 239.3 lb

## 2016-09-17 DIAGNOSIS — O0992 Supervision of high risk pregnancy, unspecified, second trimester: Secondary | ICD-10-CM | POA: Diagnosis present

## 2016-09-17 DIAGNOSIS — Z23 Encounter for immunization: Secondary | ICD-10-CM

## 2016-09-17 DIAGNOSIS — O359XX0 Maternal care for (suspected) fetal abnormality and damage, unspecified, not applicable or unspecified: Secondary | ICD-10-CM | POA: Diagnosis not present

## 2016-09-17 NOTE — Progress Notes (Signed)
   PRENATAL VISIT NOTE  Subjective:  Alyssa Hartman is a 21 y.o. G2P1001 at 3582w5d being seen today for ongoing prenatal care.  She is currently monitored for the following issues for this high-risk pregnancy and has Supervision of high risk pregnancy in second trimester; Bipolar 1 disorder (HCC); Hashimoto's thyroiditis; Obesity; Suspected abnormality of fetus affecting management of mother in singleton pregnancy; and History of poor fetal growth on her problem list.  Patient reports no complaints.  Contractions: Not present. Vag. Bleeding: None.  Movement: Present. Denies leaking of fluid.   The following portions of the patient's history were reviewed and updated as appropriate: allergies, current medications, past family history, past medical history, past social history, past surgical history and problem list. Problem list updated.  Objective:   Vitals:   09/17/16 1337  BP: (!) 103/52  Pulse: 89  Weight: 239 lb 4.8 oz (108.5 kg)    Fetal Status: Fetal Heart Rate (bpm): 140 Fundal Height: 29 cm Movement: Present     General:  Alert, oriented and cooperative. Patient is in no acute distress.  Skin: Skin is warm and dry. No rash noted.   Cardiovascular: Normal heart rate noted  Respiratory: Normal respiratory effort, no problems with respiration noted  Abdomen: Soft, gravid, appropriate for gestational age.  Pain/Pressure: Absent     Pelvic: Cervical exam deferred        Extremities: Normal range of motion.  Edema: None  Mental Status:  Normal mood and affect. Normal behavior. Normal judgment and thought content.   Assessment and Plan:  Pregnancy: G2P1001 at 6482w5d  1. Supervision of high risk pregnancy in second trimester To return for 28 wk labs when fasting - Tdap vaccine greater than or equal to 7yo IM  2. Suspected abnormality of fetus affecting management of mother in singleton pregnancy Has IUGR--followed with weekly MFM BPPs  Preterm labor symptoms and general  obstetric precautions including but not limited to vaginal bleeding, contractions, leaking of fluid and fetal movement were reviewed in detail with the patient. Please refer to After Visit Summary for other counseling recommendations.  Return in 2 weeks (on 10/01/2016) for 28 wk labs.   Reva Boresanya S Telesha Deguzman, MD

## 2016-09-17 NOTE — Patient Instructions (Signed)
Third Trimester of Pregnancy The third trimester is from week 29 through week 42, months 7 through 9. This trimester is when your unborn baby (fetus) is growing very fast. At the end of the ninth month, the unborn baby is about 20 inches in length. It weighs about 6-10 pounds. Follow these instructions at home:  Avoid all smoking, herbs, and alcohol. Avoid drugs not approved by your doctor.  Do not use any tobacco products, including cigarettes, chewing tobacco, and electronic cigarettes. If you need help quitting, ask your doctor. You may get counseling or other support to help you quit.  Only take medicine as told by your doctor. Some medicines are safe and some are not during pregnancy.  Exercise only as told by your doctor. Stop exercising if you start having cramps.  Eat regular, healthy meals.  Wear a good support bra if your breasts are tender.  Do not use hot tubs, steam rooms, or saunas.  Wear your seat belt when driving.  Avoid raw meat, uncooked cheese, and liter boxes and soil used by cats.  Take your prenatal vitamins.  Take 1500-2000 milligrams of calcium daily starting at the 20th week of pregnancy until you deliver your baby.  Try taking medicine that helps you poop (stool softener) as needed, and if your doctor approves. Eat more fiber by eating fresh fruit, vegetables, and whole grains. Drink enough fluids to keep your pee (urine) clear or pale yellow.  Take warm water baths (sitz baths) to soothe pain or discomfort caused by hemorrhoids. Use hemorrhoid cream if your doctor approves.  If you have puffy, bulging veins (varicose veins), wear support hose. Raise (elevate) your feet for 15 minutes, 3-4 times a day. Limit salt in your diet.  Avoid heavy lifting, wear low heels, and sit up straight.  Rest with your legs raised if you have leg cramps or low back pain.  Visit your dentist if you have not gone during your pregnancy. Use a soft toothbrush to brush your  teeth. Be gentle when you floss.  You can have sex (intercourse) unless your doctor tells you not to.  Do not travel far distances unless you must. Only do so with your doctor's approval.  Take prenatal classes.  Practice driving to the hospital.  Pack your hospital bag.  Prepare the baby's room.  Go to your doctor visits. Get help if:  You are not sure if you are in labor or if your water has broken.  You are dizzy.  You have mild cramps or pressure in your lower belly (abdominal).  You have a nagging pain in your belly area.  You continue to feel sick to your stomach (nauseous), throw up (vomit), or have watery poop (diarrhea).  You have bad smelling fluid coming from your vagina.  You have pain with peeing (urination). Get help right away if:  You have a fever.  You are leaking fluid from your vagina.  You are spotting or bleeding from your vagina.  You have severe belly cramping or pain.  You lose or gain weight rapidly.  You have trouble catching your breath and have chest pain.  You notice sudden or extreme puffiness (swelling) of your face, hands, ankles, feet, or legs.  You have not felt the baby move in over an hour.  You have severe headaches that do not go away with medicine.  You have vision changes. This information is not intended to replace advice given to you by your health care provider. Make   sure you discuss any questions you have with your health care provider. Document Released: 05/07/2009 Document Revised: 07/19/2015 Document Reviewed: 04/13/2012 Elsevier Interactive Patient Education  2017 [employer]. Breastfeeding Deciding to breastfeed is one of the best choices you can make for you and your baby. A change in hormones during pregnancy causes your breast tissue to grow and increases the number and size of your milk ducts. These hormones also allow proteins, sugars, and fats from your blood supply to make breast milk in your  milk-producing glands. Hormones prevent breast milk from being released before your baby is born as well as prompt milk flow after birth. Once breastfeeding has begun, thoughts of your baby, as well as his or her sucking or crying, can stimulate the release of milk from your milk-producing glands. Benefits of breastfeeding For Your Baby  Your first milk (colostrum) helps your baby's digestive system function better.  There are antibodies in your milk that help your baby fight off infections.  Your baby has a lower incidence of asthma, allergies, and sudden infant death syndrome.  The nutrients in breast milk are better for your baby than infant formulas and are designed uniquely for your baby's needs.  Breast milk improves your baby's brain development.  Your baby is less likely to develop other conditions, such as childhood obesity, asthma, or type 2 diabetes mellitus.  For You  Breastfeeding helps to create a very special bond between you and your baby.  Breastfeeding is convenient. Breast milk is always available at the correct temperature and costs nothing.  Breastfeeding helps to burn calories and helps you lose the weight gained during pregnancy.  Breastfeeding makes your uterus contract to its prepregnancy size faster and slows bleeding (lochia) after you give birth.  Breastfeeding helps to lower your risk of developing type 2 diabetes mellitus, osteoporosis, and breast or ovarian cancer later in life.  Signs that your baby is hungry Early Signs of Hunger  Increased alertness or activity.  Stretching.  Movement of the head from side to side.  Movement of the head and opening of the mouth when the corner of the mouth or cheek is stroked (rooting).  Increased sucking sounds, smacking lips, cooing, sighing, or squeaking.  Hand-to-mouth movements.  Increased sucking of fingers or hands.  Late Signs of Hunger  Fussing.  Intermittent crying.  Extreme Signs of  Hunger Signs of extreme hunger will require calming and consoling before your baby will be able to breastfeed successfully. Do not wait for the following signs of extreme hunger to occur before you initiate breastfeeding:  Restlessness.  A loud, strong cry.  Screaming.  Breastfeeding basics Breastfeeding Initiation  Find a comfortable place to sit or lie down, with your neck and back well supported.  Place a pillow or rolled up blanket under your baby to bring him or her to the level of your breast (if you are seated). Nursing pillows are specially designed to help support your arms and your baby while you breastfeed.  Make sure that your baby's abdomen is facing your abdomen.  Gently massage your breast. With your fingertips, massage from your chest wall toward your nipple in a circular motion. This encourages milk flow. You may need to continue this action during the feeding if your milk flows slowly.  Support your breast with 4 fingers underneath and your thumb above your nipple. Make sure your fingers are well away from your nipple and your baby's mouth.  Stroke your baby's lips gently with your  finger or nipple.  When your baby's mouth is open wide enough, quickly bring your baby to your breast, placing your entire nipple and as much of the colored area around your nipple (areola) as possible into your baby's mouth. ? More areola should be visible above your baby's upper lip than below the lower lip. ? Your baby's tongue should be between his or her lower gum and your breast.  Ensure that your baby's mouth is correctly positioned around your nipple (latched). Your baby's lips should create a seal on your breast and be turned out (everted).  It is common for your baby to suck about 2-3 minutes in order to start the flow of breast milk.  Latching Teaching your baby how to latch on to your breast properly is very important. An improper latch can cause nipple pain and decreased milk  supply for you and poor weight gain in your baby. Also, if your baby is not latched onto your nipple properly, he or she may swallow some air during feeding. This can make your baby fussy. Burping your baby when you switch breasts during the feeding can help to get rid of the air. However, teaching your baby to latch on properly is still the best way to prevent fussiness from swallowing air while breastfeeding. Signs that your baby has successfully latched on to your nipple:  Silent tugging or silent sucking, without causing you pain.  Swallowing heard between every 3-4 sucks.  Muscle movement above and in front of his or her ears while sucking.  Signs that your baby has not successfully latched on to nipple:  Sucking sounds or smacking sounds from your baby while breastfeeding.  Nipple pain.  If you think your baby has not latched on correctly, slip your finger into the corner of your baby's mouth to break the suction and place it between your baby's gums. Attempt breastfeeding initiation again. Signs of Successful Breastfeeding Signs from your baby:  A gradual decrease in the number of sucks or complete cessation of sucking.  Falling asleep.  Relaxation of his or her body.  Retention of a small amount of milk in his or her mouth.  Letting go of your breast by himself or herself.  Signs from you:  Breasts that have increased in firmness, weight, and size 1-3 hours after feeding.  Breasts that are softer immediately after breastfeeding.  Increased milk volume, as well as a change in milk consistency and color by the fifth day of breastfeeding.  Nipples that are not sore, cracked, or bleeding.  Signs That Your Pecola Leisure is Getting Enough Milk  Wetting at least 1-2 diapers during the first 24 hours after birth.  Wetting at least 5-6 diapers every 24 hours for the first week after birth. The urine should be clear or pale yellow by 5 days after birth.  Wetting 6-8 diapers every 24  hours as your baby continues to grow and develop.  At least 3 stools in a 24-hour period by age 36 days. The stool should be soft and yellow.  At least 3 stools in a 24-hour period by age 12 days. The stool should be seedy and yellow.  No loss of weight greater than 10% of birth weight during the first 31 days of age.  Average weight gain of 4-7 ounces (113-198 g) per week after age 79 days.  Consistent daily weight gain by age 36 days, without weight loss after the age of 2 weeks.  After a feeding, your baby may  spit up a small amount. This is common. Breastfeeding frequency and duration Frequent feeding will help you make more milk and can prevent sore nipples and breast engorgement. Breastfeed when you feel the need to reduce the fullness of your breasts or when your baby shows signs of hunger. This is called "breastfeeding on demand." Avoid introducing a pacifier to your baby while you are working to establish breastfeeding (the first 4-6 weeks after your baby is born). After this time you may choose to use a pacifier. Research has shown that pacifier use during the first year of a baby's life decreases the risk of sudden infant death syndrome (SIDS). Allow your baby to feed on each breast as long as he or she wants. Breastfeed until your baby is finished feeding. When your baby unlatches or falls asleep while feeding from the first breast, offer the second breast. Because newborns are often sleepy in the first few weeks of life, you may need to awaken your baby to get him or her to feed. Breastfeeding times will vary from baby to baby. However, the following rules can serve as a guide to help you ensure that your baby is properly fed:  Newborns (babies 454 weeks of age or younger) may breastfeed every 1-3 hours.  Newborns should not go longer than 3 hours during the day or 5 hours during the night without breastfeeding.  You should breastfeed your baby a minimum of 8 times in a 24-hour period  until you begin to introduce solid foods to your baby at around 196 months of age.  Breast milk pumping Pumping and storing breast milk allows you to ensure that your baby is exclusively fed your breast milk, even at times when you are unable to breastfeed. This is especially important if you are going back to work while you are still breastfeeding or when you are not able to be present during feedings. Your lactation consultant can give you guidelines on how long it is safe to store breast milk. A breast pump is a machine that allows you to pump milk from your breast into a sterile bottle. The pumped breast milk can then be stored in a refrigerator or freezer. Some breast pumps are operated by hand, while others use electricity. Ask your lactation consultant which type will work best for you. Breast pumps can be purchased, but some hospitals and breastfeeding support groups lease breast pumps on a monthly basis. A lactation consultant can teach you how to hand express breast milk, if you prefer not to use a pump. Caring for your breasts while you breastfeed Nipples can become dry, cracked, and sore while breastfeeding. The following recommendations can help keep your breasts moisturized and healthy:  Avoid using soap on your nipples.  Wear a supportive bra. Although not required, special nursing bras and tank tops are designed to allow access to your breasts for breastfeeding without taking off your entire bra or top. Avoid wearing underwire-style bras or extremely tight bras.  Air dry your nipples for 3-434minutes after each feeding.  Use only cotton bra pads to absorb leaked breast milk. Leaking of breast milk between feedings is normal.  Use lanolin on your nipples after breastfeeding. Lanolin helps to maintain your skin's normal moisture barrier. If you use pure lanolin, you do not need to wash it off before feeding your baby again. Pure lanolin is not toxic to your baby. You may also hand express  a few drops of breast milk and gently massage that milk  into your nipples and allow the milk to air dry.  In the first few weeks after giving birth, some women experience extremely full breasts (engorgement). Engorgement can make your breasts feel heavy, warm, and tender to the touch. Engorgement peaks within 3-5 days after you give birth. The following recommendations can help ease engorgement:  Completely empty your breasts while breastfeeding or pumping. You may want to start by applying warm, moist heat (in the shower or with warm water-soaked hand towels) just before feeding or pumping. This increases circulation and helps the milk flow. If your baby does not completely empty your breasts while breastfeeding, pump any extra milk after he or she is finished.  Wear a snug bra (nursing or regular) or tank top for 1-2 days to signal your body to slightly decrease milk production.  Apply ice packs to your breasts, unless this is too uncomfortable for you.  Make sure that your baby is latched on and positioned properly while breastfeeding.  If engorgement persists after 48 hours of following these recommendations, contact your health care provider or a Advertising copywriter. Overall health care recommendations while breastfeeding  Eat healthy foods. Alternate between meals and snacks, eating 3 of each per day. Because what you eat affects your breast milk, some of the foods may make your baby more irritable than usual. Avoid eating these foods if you are sure that they are negatively affecting your baby.  Drink milk, fruit juice, and water to satisfy your thirst (about 10 glasses a day).  Rest often, relax, and continue to take your prenatal vitamins to prevent fatigue, stress, and anemia.  Continue breast self-awareness checks.  Avoid chewing and smoking tobacco. Chemicals from cigarettes that pass into breast milk and exposure to secondhand smoke may harm your baby.  Avoid alcohol and drug  use, including marijuana. Some medicines that may be harmful to your baby can pass through breast milk. It is important to ask your health care provider before taking any medicine, including all over-the-counter and prescription medicine as well as vitamin and herbal supplements. It is possible to become pregnant while breastfeeding. If birth control is desired, ask your health care provider about options that will be safe for your baby. Contact a health care provider if:  You feel like you want to stop breastfeeding or have become frustrated with breastfeeding.  You have painful breasts or nipples.  Your nipples are cracked or bleeding.  Your breasts are red, tender, or warm.  You have a swollen area on either breast.  You have a fever or chills.  You have nausea or vomiting.  You have drainage other than breast milk from your nipples.  Your breasts do not become full before feedings by the fifth day after you give birth.  You feel sad and depressed.  Your baby is too sleepy to eat well.  Your baby is having trouble sleeping.  Your baby is wetting less than 3 diapers in a 24-hour period.  Your baby has less than 3 stools in a 24-hour period.  Your baby's skin or the white part of his or her eyes becomes yellow.  Your baby is not gaining weight by 38 days of age. Get help right away if:  Your baby is overly tired (lethargic) and does not want to wake up and feed.  Your baby develops an unexplained fever. This information is not intended to replace advice given to you by your health care provider. Make sure you discuss any questions you have  with your health care provider. Document Released: 02/10/2005 Document Revised: 07/25/2015 Document Reviewed: 08/04/2012 Elsevier Interactive Patient Education  2017 [employer]

## 2016-09-19 ENCOUNTER — Ambulatory Visit (HOSPITAL_COMMUNITY)
Admission: RE | Admit: 2016-09-19 | Discharge: 2016-09-19 | Disposition: A | Discharge status: Home / Self Care | Payer: Medicaid Other | Source: Ambulatory Visit | Attending: Obstetrics and Gynecology | Admitting: Obstetrics and Gynecology

## 2016-09-19 ENCOUNTER — Encounter (HOSPITAL_COMMUNITY): Payer: Self-pay

## 2016-09-19 DIAGNOSIS — O365930 Maternal care for other known or suspected poor fetal growth, third trimester, not applicable or unspecified: Secondary | ICD-10-CM | POA: Insufficient documentation

## 2016-09-19 DIAGNOSIS — O352XX0 Maternal care for (suspected) hereditary disease in fetus, not applicable or unspecified: Secondary | ICD-10-CM | POA: Diagnosis not present

## 2016-09-19 DIAGNOSIS — O99283 Endocrine, nutritional and metabolic diseases complicating pregnancy, third trimester: Secondary | ICD-10-CM | POA: Insufficient documentation

## 2016-09-19 DIAGNOSIS — O09293 Supervision of pregnancy with other poor reproductive or obstetric history, third trimester: Secondary | ICD-10-CM | POA: Insufficient documentation

## 2016-09-19 DIAGNOSIS — Z8489 Family history of other specified conditions: Secondary | ICD-10-CM | POA: Insufficient documentation

## 2016-09-19 DIAGNOSIS — E079 Disorder of thyroid, unspecified: Secondary | ICD-10-CM | POA: Diagnosis not present

## 2016-09-19 DIAGNOSIS — O99212 Obesity complicating pregnancy, second trimester: Secondary | ICD-10-CM | POA: Diagnosis not present

## 2016-09-19 DIAGNOSIS — Z3A28 28 weeks gestation of pregnancy: Secondary | ICD-10-CM | POA: Insufficient documentation

## 2016-09-19 DIAGNOSIS — O365990 Maternal care for other known or suspected poor fetal growth, unspecified trimester, not applicable or unspecified: Secondary | ICD-10-CM

## 2016-09-26 ENCOUNTER — Ambulatory Visit (HOSPITAL_COMMUNITY)
Admission: RE | Admit: 2016-09-26 | Discharge: 2016-09-26 | Disposition: A | Discharge status: Home / Self Care | Payer: Medicaid Other | Source: Ambulatory Visit | Attending: Family Medicine | Admitting: Family Medicine

## 2016-09-26 ENCOUNTER — Other Ambulatory Visit (HOSPITAL_COMMUNITY): Payer: Self-pay | Admitting: Obstetrics and Gynecology

## 2016-09-26 ENCOUNTER — Encounter (HOSPITAL_COMMUNITY): Payer: Self-pay

## 2016-09-26 DIAGNOSIS — O99283 Endocrine, nutritional and metabolic diseases complicating pregnancy, third trimester: Secondary | ICD-10-CM | POA: Diagnosis not present

## 2016-09-26 DIAGNOSIS — O99213 Obesity complicating pregnancy, third trimester: Secondary | ICD-10-CM | POA: Diagnosis not present

## 2016-09-26 DIAGNOSIS — Z8489 Family history of other specified conditions: Secondary | ICD-10-CM

## 2016-09-26 DIAGNOSIS — E079 Disorder of thyroid, unspecified: Secondary | ICD-10-CM | POA: Diagnosis not present

## 2016-09-26 DIAGNOSIS — O365990 Maternal care for other known or suspected poor fetal growth, unspecified trimester, not applicable or unspecified: Secondary | ICD-10-CM

## 2016-09-26 DIAGNOSIS — Z3A29 29 weeks gestation of pregnancy: Secondary | ICD-10-CM | POA: Diagnosis not present

## 2016-09-26 DIAGNOSIS — O365930 Maternal care for other known or suspected poor fetal growth, third trimester, not applicable or unspecified: Secondary | ICD-10-CM | POA: Diagnosis present

## 2016-09-29 ENCOUNTER — Other Ambulatory Visit (HOSPITAL_COMMUNITY): Payer: Self-pay | Admitting: *Deleted

## 2016-09-29 DIAGNOSIS — O365930 Maternal care for other known or suspected poor fetal growth, third trimester, not applicable or unspecified: Secondary | ICD-10-CM

## 2016-10-03 ENCOUNTER — Other Ambulatory Visit (HOSPITAL_COMMUNITY): Payer: Self-pay | Admitting: Maternal and Fetal Medicine

## 2016-10-03 ENCOUNTER — Encounter (HOSPITAL_COMMUNITY): Payer: Self-pay

## 2016-10-03 ENCOUNTER — Ambulatory Visit (HOSPITAL_COMMUNITY)
Admission: RE | Admit: 2016-10-03 | Discharge: 2016-10-03 | Disposition: A | Discharge status: Home / Self Care | Payer: Medicaid Other | Source: Ambulatory Visit | Attending: Obstetrics and Gynecology | Admitting: Obstetrics and Gynecology

## 2016-10-03 DIAGNOSIS — O365990 Maternal care for other known or suspected poor fetal growth, unspecified trimester, not applicable or unspecified: Secondary | ICD-10-CM

## 2016-10-03 DIAGNOSIS — O99213 Obesity complicating pregnancy, third trimester: Secondary | ICD-10-CM | POA: Diagnosis not present

## 2016-10-03 DIAGNOSIS — O365930 Maternal care for other known or suspected poor fetal growth, third trimester, not applicable or unspecified: Secondary | ICD-10-CM

## 2016-10-03 DIAGNOSIS — Z3A30 30 weeks gestation of pregnancy: Secondary | ICD-10-CM

## 2016-10-03 DIAGNOSIS — E669 Obesity, unspecified: Secondary | ICD-10-CM | POA: Insufficient documentation

## 2016-10-08 ENCOUNTER — Encounter: Payer: Medicaid Other | Admitting: Obstetrics and Gynecology

## 2016-10-10 ENCOUNTER — Ambulatory Visit (HOSPITAL_COMMUNITY)
Admission: RE | Admit: 2016-10-10 | Discharge: 2016-10-10 | Disposition: A | Discharge status: Home / Self Care | Payer: Medicaid Other | Source: Ambulatory Visit | Attending: Obstetrics and Gynecology | Admitting: Obstetrics and Gynecology

## 2016-10-10 ENCOUNTER — Other Ambulatory Visit (HOSPITAL_COMMUNITY): Payer: Self-pay | Admitting: Maternal and Fetal Medicine

## 2016-10-10 ENCOUNTER — Encounter (HOSPITAL_COMMUNITY): Payer: Self-pay

## 2016-10-10 DIAGNOSIS — O99283 Endocrine, nutritional and metabolic diseases complicating pregnancy, third trimester: Secondary | ICD-10-CM | POA: Insufficient documentation

## 2016-10-10 DIAGNOSIS — Z8489 Family history of other specified conditions: Secondary | ICD-10-CM | POA: Insufficient documentation

## 2016-10-10 DIAGNOSIS — Z3689 Encounter for other specified antenatal screening: Secondary | ICD-10-CM | POA: Diagnosis present

## 2016-10-10 DIAGNOSIS — O365930 Maternal care for other known or suspected poor fetal growth, third trimester, not applicable or unspecified: Secondary | ICD-10-CM

## 2016-10-10 DIAGNOSIS — O352XX0 Maternal care for (suspected) hereditary disease in fetus, not applicable or unspecified: Secondary | ICD-10-CM | POA: Diagnosis not present

## 2016-10-10 DIAGNOSIS — O365990 Maternal care for other known or suspected poor fetal growth, unspecified trimester, not applicable or unspecified: Secondary | ICD-10-CM

## 2016-10-10 DIAGNOSIS — Z3A31 31 weeks gestation of pregnancy: Secondary | ICD-10-CM

## 2016-10-10 DIAGNOSIS — E079 Disorder of thyroid, unspecified: Secondary | ICD-10-CM | POA: Diagnosis not present

## 2016-10-10 DIAGNOSIS — O09293 Supervision of pregnancy with other poor reproductive or obstetric history, third trimester: Secondary | ICD-10-CM | POA: Diagnosis not present

## 2016-10-17 ENCOUNTER — Encounter (HOSPITAL_COMMUNITY): Payer: Self-pay

## 2016-10-17 ENCOUNTER — Ambulatory Visit (HOSPITAL_COMMUNITY)
Admission: RE | Admit: 2016-10-17 | Discharge: 2016-10-17 | Disposition: A | Discharge status: Home / Self Care | Payer: Medicaid Other | Source: Ambulatory Visit | Attending: Obstetrics and Gynecology | Admitting: Obstetrics and Gynecology

## 2016-10-17 DIAGNOSIS — Z3A32 32 weeks gestation of pregnancy: Secondary | ICD-10-CM | POA: Insufficient documentation

## 2016-10-17 DIAGNOSIS — O09293 Supervision of pregnancy with other poor reproductive or obstetric history, third trimester: Secondary | ICD-10-CM | POA: Insufficient documentation

## 2016-10-17 DIAGNOSIS — E079 Disorder of thyroid, unspecified: Secondary | ICD-10-CM | POA: Diagnosis not present

## 2016-10-17 DIAGNOSIS — O365930 Maternal care for other known or suspected poor fetal growth, third trimester, not applicable or unspecified: Secondary | ICD-10-CM

## 2016-10-17 DIAGNOSIS — O99283 Endocrine, nutritional and metabolic diseases complicating pregnancy, third trimester: Secondary | ICD-10-CM | POA: Insufficient documentation

## 2016-10-17 DIAGNOSIS — Z8279 Family history of other congenital malformations, deformations and chromosomal abnormalities: Secondary | ICD-10-CM | POA: Insufficient documentation

## 2016-11-03 ENCOUNTER — Ambulatory Visit (INDEPENDENT_AMBULATORY_CARE_PROVIDER_SITE_OTHER): Payer: Medicaid Other | Admitting: Obstetrics & Gynecology

## 2016-11-03 ENCOUNTER — Encounter: Payer: Self-pay | Admitting: Obstetrics & Gynecology

## 2016-11-03 VITALS — BP 109/62 | HR 87 | Wt 241.9 lb

## 2016-11-03 DIAGNOSIS — O0993 Supervision of high risk pregnancy, unspecified, third trimester: Secondary | ICD-10-CM | POA: Diagnosis present

## 2016-11-03 DIAGNOSIS — Z23 Encounter for immunization: Secondary | ICD-10-CM

## 2016-11-03 DIAGNOSIS — O0990 Supervision of high risk pregnancy, unspecified, unspecified trimester: Secondary | ICD-10-CM

## 2016-11-03 NOTE — Progress Notes (Signed)
   PRENATAL VISIT NOTE  Subjective:  Alyssa Hartman is a 21 y.o. G2P1001 at 465w5d being seen today for ongoing prenatal care.  She is currently monitored for the following issues for this high-risk pregnancy and has Supervision of high risk pregnancy, antepartum; Bipolar 1 disorder (HCC); Hashimoto's thyroiditis; Obesity; Suspected abnormality of fetus affecting management of mother in singleton pregnancy; and History of poor fetal growth on her problem list.  Patient reports no complaints.  Contractions: Irritability. Vag. Bleeding: None.  Movement: Present. Denies leaking of fluid.   The following portions of the patient's history were reviewed and updated as appropriate: allergies, current medications, past family history, past medical history, past social history, past surgical history and problem list. Problem list updated.  Objective:   Vitals:   11/03/16 0936  BP: 109/62  Pulse: 87  Weight: 109.7 kg (241 lb 14.4 oz)    Fetal Status: Fetal Heart Rate (bpm): 131   Movement: Present     General:  Alert, oriented and cooperative. Patient is in no acute distress.  Skin: Skin is warm and dry. No rash noted.   Cardiovascular: Normal heart rate noted  Respiratory: Normal respiratory effort, no problems with respiration noted  Abdomen: Soft, gravid, appropriate for gestational age.  Pain/Pressure: Present     Pelvic: Cervical exam deferred        Extremities: Normal range of motion.  Edema: None  Mental Status:  Normal mood and affect. Normal behavior. Normal judgment and thought content.   Assessment and Plan:  Pregnancy: G2P1001 at 9165w5d  1. Supervision of high risk pregnancy in third trimester  - Flu Vaccine QUAD 36+ mos IM (Fluarix, Quad PF) - Glucose Tolerance, 2 Hours w/1 Hour - CBC - RPR - HIV antibody (with reflex) - US MFM OB FOLLOW UP; Future - TSH  2. Needs flu shot  - Flu Vaccine QUAD 36+ mos IM (Fluarix, Quad PF)  Preterm labor symptoms and general  obstetric precautions including but not limited to vaginal bleeding, contractions, leaking of fluid and fetal movement were reviewed in detail with the patient. Please refer to After Visit Summary for other counseling recommendations.  2 week f/u Revised Dating based on 12.6 week US and LMP was in PP period Scheryl DarterJames Graycie Halley, MD

## 2016-11-03 NOTE — Patient Instructions (Signed)
Third Trimester of Pregnancy The third trimester is from week 28 through week 40 (months 7 through 9). The third trimester is a time when the unborn baby (fetus) is growing rapidly. At the end of the ninth month, the fetus is about 20 inches in length and weighs 6-10 pounds. Body changes during your third trimester Your body will continue to go through many changes during pregnancy. The changes vary from woman to woman. During the third trimester:  Your weight will continue to increase. You can expect to gain 25-35 pounds (11-16 kg) by the end of the pregnancy.  You may begin to get stretch marks on your hips, abdomen, and breasts.  You may urinate more often because the fetus is moving lower into your pelvis and pressing on your bladder.  You may develop or continue to have heartburn. This is caused by increased hormones that slow down muscles in the digestive tract.  You may develop or continue to have constipation because increased hormones slow digestion and cause the muscles that push waste through your intestines to relax.  You may develop hemorrhoids. These are swollen veins (varicose veins) in the rectum that can itch or be painful.  You may develop swollen, bulging veins (varicose veins) in your legs.  You may have increased body aches in the pelvis, back, or thighs. This is due to weight gain and increased hormones that are relaxing your joints.  You may have changes in your hair. These can include thickening of your hair, rapid growth, and changes in texture. Some women also have hair loss during or after pregnancy, or hair that feels dry or thin. Your hair will most likely return to normal after your baby is born.  Your breasts will continue to grow and they will continue to become tender. A yellow fluid (colostrum) may leak from your breasts. This is the first milk you are producing for your baby.  Your belly button may stick out.  You may notice more swelling in your hands,  face, or ankles.  You may have increased tingling or numbness in your hands, arms, and legs. The skin on your belly may also feel numb.  You may feel short of breath because of your expanding uterus.  You may have more problems sleeping. This can be caused by the size of your belly, increased need to urinate, and an increase in your body's metabolism.  You may notice the fetus "dropping," or moving lower in your abdomen (lightening).  You may have increased vaginal discharge.  You may notice your joints feel loose and you may have pain around your pelvic bone.  What to expect at prenatal visits You will have prenatal exams every 2 weeks until week 36. Then you will have weekly prenatal exams. During a routine prenatal visit:  You will be weighed to make sure you and the baby are growing normally.  Your blood pressure will be taken.  Your abdomen will be measured to track your baby's growth.  The fetal heartbeat will be listened to.  Any test results from the previous visit will be discussed.  You may have a cervical check near your due date to see if your cervix has softened or thinned (effaced).  You will be tested for Group B streptococcus. This happens between 35 and 37 weeks.  Your health care provider may ask you:  What your birth plan is.  How you are feeling.  If you are feeling the baby move.  If you have had   any abnormal symptoms, such as leaking fluid, bleeding, severe headaches, or abdominal cramping.  If you are using any tobacco products, including cigarettes, chewing tobacco, and electronic cigarettes.  If you have any questions.  Other tests or screenings that may be performed during your third trimester include:  Blood tests that check for low iron levels (anemia).  Fetal testing to check the health, activity level, and growth of the fetus. Testing is done if you have certain medical conditions or if there are problems during the  pregnancy.  Nonstress test (NST). This test checks the health of your baby to make sure there are no signs of problems, such as the baby not getting enough oxygen. During this test, a belt is placed around your belly. The baby is made to move, and its heart rate is monitored during movement.  What is false labor? False labor is a condition in which you feel small, irregular tightenings of the muscles in the womb (contractions) that usually go away with rest, changing position, or drinking water. These are called Braxton Hicks contractions. Contractions may last for hours, days, or even weeks before true labor sets in. If contractions come at regular intervals, become more frequent, increase in intensity, or become painful, you should see your health care provider. What are the signs of labor?  Abdominal cramps.  Regular contractions that start at 10 minutes apart and become stronger and more frequent with time.  Contractions that start on the top of the uterus and spread down to the lower abdomen and back.  Increased pelvic pressure and dull back pain.  A watery or bloody mucus discharge that comes from the vagina.  Leaking of amniotic fluid. This is also known as your "water breaking." It could be a slow trickle or a gush. Let your health care provider know if it has a color or strange odor. If you have any of these signs, call your health care provider right away, even if it is before your due date. Follow these instructions at home: Medicines  Follow your health care provider's instructions regarding medicine use. Specific medicines may be either safe or unsafe to take during pregnancy.  Take a prenatal vitamin that contains at least 600 micrograms (mcg) of folic acid.  If you develop constipation, try taking a stool softener if your health care provider approves. Eating and drinking  Eat a balanced diet that includes fresh fruits and vegetables, whole grains, good sources of protein  such as meat, eggs, or tofu, and low-fat dairy. Your health care provider will help you determine the amount of weight gain that is right for you.  Avoid raw meat and uncooked cheese. These carry germs that can cause birth defects in the baby.  If you have low calcium intake from food, talk to your health care provider about whether you should take a daily calcium supplement.  Eat four or five small meals rather than three large meals a day.  Limit foods that are high in fat and processed sugars, such as fried and sweet foods.  To prevent constipation: ? Drink enough fluid to keep your urine clear or pale yellow. ? Eat foods that are high in fiber, such as fresh fruits and vegetables, whole grains, and beans. Activity  Exercise only as directed by your health care provider. Most women can continue their usual exercise routine during pregnancy. Try to exercise for 30 minutes at least 5 days a week. Stop exercising if you experience uterine contractions.  Avoid heavy   lifting.  Do not exercise in extreme heat or humidity, or at high altitudes.  Wear low-heel, comfortable shoes.  Practice good posture.  You may continue to have sex unless your health care provider tells you otherwise. Relieving pain and discomfort  Take frequent breaks and rest with your legs elevated if you have leg cramps or low back pain.  Take warm sitz baths to soothe any pain or discomfort caused by hemorrhoids. Use hemorrhoid cream if your health care provider approves.  Wear a good support bra to prevent discomfort from breast tenderness.  If you develop varicose veins: ? Wear support pantyhose or compression stockings as told by your healthcare provider. ? Elevate your feet for 15 minutes, 3-4 times a day. Prenatal care  Write down your questions. Take them to your prenatal visits.  Keep all your prenatal visits as told by your health care provider. This is important. Safety  Wear your seat belt at  all times when driving.  Make a list of emergency phone numbers, including numbers for family, friends, the hospital, and police and fire departments. General instructions  Avoid cat litter boxes and soil used by cats. These carry germs that can cause birth defects in the baby. If you have a cat, ask someone to clean the litter box for you.  Do not travel far distances unless it is absolutely necessary and only with the approval of your health care provider.  Do not use hot tubs, steam rooms, or saunas.  Do not drink alcohol.  Do not use any products that contain nicotine or tobacco, such as cigarettes and e-cigarettes. If you need help quitting, ask your health care provider.  Do not use any medicinal herbs or unprescribed drugs. These chemicals affect the formation and growth of the baby.  Do not douche or use tampons or scented sanitary pads.  Do not cross your legs for long periods of time.  To prepare for the arrival of your baby: ? Take prenatal classes to understand, practice, and ask questions about labor and delivery. ? Make a trial run to the hospital. ? Visit the hospital and tour the maternity area. ? Arrange for maternity or paternity leave through employers. ? Arrange for family and friends to take care of pets while you are in the hospital. ? Purchase a rear-facing car seat and make sure you know how to install it in your car. ? Pack your hospital bag. ? Prepare the baby's nursery. Make sure to remove all pillows and stuffed animals from the baby's crib to prevent suffocation.  Visit your dentist if you have not gone during your pregnancy. Use a soft toothbrush to brush your teeth and be gentle when you floss. Contact a health care provider if:  You are unsure if you are in labor or if your water has broken.  You become dizzy.  You have mild pelvic cramps, pelvic pressure, or nagging pain in your abdominal area.  You have lower back pain.  You have persistent  nausea, vomiting, or diarrhea.  You have an unusual or bad smelling vaginal discharge.  You have pain when you urinate. Get help right away if:  Your water breaks before 37 weeks.  You have regular contractions less than 5 minutes apart before 37 weeks.  You have a fever.  You are leaking fluid from your vagina.  You have spotting or bleeding from your vagina.  You have severe abdominal pain or cramping.  You have rapid weight loss or weight gain.    You have shortness of breath with chest pain.  You notice sudden or extreme swelling of your face, hands, ankles, feet, or legs.  Your baby makes fewer than 10 movements in 2 hours.  You have severe headaches that do not go away when you take medicine.  You have vision changes. Summary  The third trimester is from week 28 through week 40, months 7 through 9. The third trimester is a time when the unborn baby (fetus) is growing rapidly.  During the third trimester, your discomfort may increase as you and your baby continue to gain weight. You may have abdominal, leg, and back pain, sleeping problems, and an increased need to urinate.  During the third trimester your breasts will keep growing and they will continue to become tender. A yellow fluid (colostrum) may leak from your breasts. This is the first milk you are producing for your baby.  False labor is a condition in which you feel small, irregular tightenings of the muscles in the womb (contractions) that eventually go away. These are called Braxton Hicks contractions. Contractions may last for hours, days, or even weeks before true labor sets in.  Signs of labor can include: abdominal cramps; regular contractions that start at 10 minutes apart and become stronger and more frequent with time; watery or bloody mucus discharge that comes from the vagina; increased pelvic pressure and dull back pain; and leaking of amniotic fluid. This information is not intended to replace advice  given to you by your health care provider. Make sure you discuss any questions you have with your health care provider. Document Released: 02/04/2001 Document Revised: 07/19/2015 Document Reviewed: 04/13/2012 Elsevier Interactive Patient Education  2017 [employer]  

## 2016-11-03 NOTE — Progress Notes (Signed)
Flu vaccine today 28 week labs today

## 2016-11-06 ENCOUNTER — Other Ambulatory Visit (HOSPITAL_COMMUNITY): Payer: Self-pay | Admitting: *Deleted

## 2016-11-06 ENCOUNTER — Other Ambulatory Visit: Payer: Self-pay | Admitting: Obstetrics & Gynecology

## 2016-11-06 ENCOUNTER — Encounter (HOSPITAL_COMMUNITY): Payer: Self-pay

## 2016-11-06 ENCOUNTER — Other Ambulatory Visit (HOSPITAL_COMMUNITY): Payer: Self-pay | Admitting: Maternal and Fetal Medicine

## 2016-11-06 ENCOUNTER — Ambulatory Visit (HOSPITAL_COMMUNITY)
Admission: RE | Admit: 2016-11-06 | Discharge: 2016-11-06 | Disposition: A | Discharge status: Home / Self Care | Payer: Medicaid Other | Source: Ambulatory Visit | Attending: Obstetrics & Gynecology | Admitting: Obstetrics & Gynecology

## 2016-11-06 DIAGNOSIS — Z3A34 34 weeks gestation of pregnancy: Secondary | ICD-10-CM

## 2016-11-06 DIAGNOSIS — O0993 Supervision of high risk pregnancy, unspecified, third trimester: Secondary | ICD-10-CM

## 2016-11-06 DIAGNOSIS — O352XX0 Maternal care for (suspected) hereditary disease in fetus, not applicable or unspecified: Secondary | ICD-10-CM | POA: Diagnosis not present

## 2016-11-06 DIAGNOSIS — O99213 Obesity complicating pregnancy, third trimester: Secondary | ICD-10-CM | POA: Insufficient documentation

## 2016-11-06 DIAGNOSIS — O99283 Endocrine, nutritional and metabolic diseases complicating pregnancy, third trimester: Secondary | ICD-10-CM | POA: Diagnosis present

## 2016-11-06 DIAGNOSIS — Z8489 Family history of other specified conditions: Secondary | ICD-10-CM | POA: Insufficient documentation

## 2016-11-06 DIAGNOSIS — O09893 Supervision of other high risk pregnancies, third trimester: Secondary | ICD-10-CM | POA: Diagnosis present

## 2016-11-06 DIAGNOSIS — O365930 Maternal care for other known or suspected poor fetal growth, third trimester, not applicable or unspecified: Secondary | ICD-10-CM

## 2016-11-06 DIAGNOSIS — E079 Disorder of thyroid, unspecified: Secondary | ICD-10-CM

## 2016-11-06 DIAGNOSIS — O365990 Maternal care for other known or suspected poor fetal growth, unspecified trimester, not applicable or unspecified: Secondary | ICD-10-CM

## 2016-11-06 DIAGNOSIS — O09299 Supervision of pregnancy with other poor reproductive or obstetric history, unspecified trimester: Secondary | ICD-10-CM

## 2016-11-07 LAB — CBC
HEMATOCRIT: 33.8 % — AB (ref 34.0–46.6)
Hemoglobin: 11.1 g/dL (ref 11.1–15.9)
MCH: 25.5 pg — ABNORMAL LOW (ref 26.6–33.0)
MCHC: 32.8 g/dL (ref 31.5–35.7)
MCV: 78 fL — ABNORMAL LOW (ref 79–97)
PLATELETS: 195 10*3/uL (ref 150–379)
RBC: 4.35 x10E6/uL (ref 3.77–5.28)
RDW: 16.6 % — AB (ref 12.3–15.4)
WBC: 7 10*3/uL (ref 3.4–10.8)

## 2016-11-07 LAB — GLUCOSE TOLERANCE, 2 HOURS W/ 1HR
GLUCOSE, 1 HOUR: 143 mg/dL (ref 65–179)
GLUCOSE, 2 HOUR: 104 mg/dL (ref 65–152)
GLUCOSE, FASTING: 74 mg/dL (ref 65–91)

## 2016-11-07 LAB — RPR, QUANT+TP ABS (REFLEX)
Rapid Plasma Reagin, Quant: 1:1 {titer} — ABNORMAL HIGH
T Pallidum Abs: NEGATIVE

## 2016-11-07 LAB — TSH: TSH: 3.75 u[IU]/mL (ref 0.450–4.500)

## 2016-11-07 LAB — RPR: RPR Ser Ql: REACTIVE — AB

## 2016-11-07 LAB — HIV ANTIBODY (ROUTINE TESTING W REFLEX): HIV SCREEN 4TH GENERATION: NONREACTIVE

## 2016-11-13 ENCOUNTER — Encounter (HOSPITAL_COMMUNITY): Payer: Self-pay

## 2016-11-13 ENCOUNTER — Ambulatory Visit (HOSPITAL_COMMUNITY)
Admission: RE | Admit: 2016-11-13 | Discharge: 2016-11-13 | Disposition: A | Discharge status: Home / Self Care | Payer: Medicaid Other | Source: Ambulatory Visit | Attending: Obstetrics & Gynecology | Admitting: Obstetrics & Gynecology

## 2016-11-13 ENCOUNTER — Other Ambulatory Visit (HOSPITAL_COMMUNITY): Payer: Self-pay | Admitting: Maternal and Fetal Medicine

## 2016-11-13 DIAGNOSIS — Z3A35 35 weeks gestation of pregnancy: Secondary | ICD-10-CM

## 2016-11-13 DIAGNOSIS — Z8489 Family history of other specified conditions: Secondary | ICD-10-CM | POA: Diagnosis not present

## 2016-11-13 DIAGNOSIS — O365990 Maternal care for other known or suspected poor fetal growth, unspecified trimester, not applicable or unspecified: Secondary | ICD-10-CM

## 2016-11-13 DIAGNOSIS — O99283 Endocrine, nutritional and metabolic diseases complicating pregnancy, third trimester: Secondary | ICD-10-CM | POA: Insufficient documentation

## 2016-11-13 DIAGNOSIS — O99213 Obesity complicating pregnancy, third trimester: Secondary | ICD-10-CM | POA: Insufficient documentation

## 2016-11-13 DIAGNOSIS — O352XX0 Maternal care for (suspected) hereditary disease in fetus, not applicable or unspecified: Secondary | ICD-10-CM | POA: Insufficient documentation

## 2016-11-13 DIAGNOSIS — O09293 Supervision of pregnancy with other poor reproductive or obstetric history, third trimester: Secondary | ICD-10-CM | POA: Diagnosis not present

## 2016-11-13 DIAGNOSIS — E079 Disorder of thyroid, unspecified: Secondary | ICD-10-CM | POA: Insufficient documentation

## 2016-11-13 DIAGNOSIS — O365930 Maternal care for other known or suspected poor fetal growth, third trimester, not applicable or unspecified: Secondary | ICD-10-CM | POA: Diagnosis not present

## 2016-11-17 ENCOUNTER — Ambulatory Visit (INDEPENDENT_AMBULATORY_CARE_PROVIDER_SITE_OTHER): Payer: Medicaid Other | Admitting: Obstetrics & Gynecology

## 2016-11-17 ENCOUNTER — Other Ambulatory Visit (HOSPITAL_COMMUNITY)
Admission: RE | Admit: 2016-11-17 | Discharge: 2016-11-17 | Disposition: A | Discharge status: Home / Self Care | Payer: Medicaid Other | Source: Ambulatory Visit | Attending: Obstetrics & Gynecology | Admitting: Obstetrics & Gynecology

## 2016-11-17 VITALS — BP 111/61 | HR 99 | Wt 240.6 lb

## 2016-11-17 DIAGNOSIS — O0990 Supervision of high risk pregnancy, unspecified, unspecified trimester: Secondary | ICD-10-CM | POA: Insufficient documentation

## 2016-11-17 DIAGNOSIS — O0993 Supervision of high risk pregnancy, unspecified, third trimester: Secondary | ICD-10-CM

## 2016-11-17 DIAGNOSIS — Z331 Pregnant state, incidental: Secondary | ICD-10-CM

## 2016-11-17 DIAGNOSIS — Z113 Encounter for screening for infections with a predominantly sexual mode of transmission: Secondary | ICD-10-CM | POA: Diagnosis not present

## 2016-11-17 DIAGNOSIS — F319 Bipolar disorder, unspecified: Secondary | ICD-10-CM

## 2016-11-17 DIAGNOSIS — Z87898 Personal history of other specified conditions: Secondary | ICD-10-CM

## 2016-11-17 DIAGNOSIS — O359XX0 Maternal care for (suspected) fetal abnormality and damage, unspecified, not applicable or unspecified: Secondary | ICD-10-CM | POA: Diagnosis present

## 2016-11-17 DIAGNOSIS — E063 Autoimmune thyroiditis: Secondary | ICD-10-CM | POA: Insufficient documentation

## 2016-11-17 LAB — OB RESULTS CONSOLE GC/CHLAMYDIA: GC PROBE AMP, GENITAL: NEGATIVE

## 2016-11-17 LAB — OB RESULTS CONSOLE GBS: STREP GROUP B AG: POSITIVE

## 2016-11-17 NOTE — Progress Notes (Signed)
   PRENATAL VISIT NOTE  Subjective:  Alyssa Hartman is a 21 y.o. G2P1001 at [redacted]w[redacted]d being seen today for ongoing prenatal care.  She is currently monitored for the following issues for this high-risk pregnancy and has Supervision of high risk pregnancy, antepartum; Bipolar 1 disorder (HCC); Hashimoto's thyroiditis; Obesity; Suspected abnormality of fetus affecting management of mother in singleton pregnancy; and History of poor fetal growth on her problem list.  Patient reports no complaints.  Contractions: Irregular. Vag. Bleeding: None.  Movement: Present. Denies leaking of fluid.   The following portions of the patient's history were reviewed and updated as appropriate: allergies, current medications, past family history, past medical history, past social history, past surgical history and problem list. Problem list updated.  Objective:   Vitals:   11/17/16 1613  BP: 111/61  Pulse: 99  Weight: 109.1 kg (240 lb 9.6 oz)    Fetal Status: Fetal Heart Rate (bpm): 139   Movement: Present     General:  Alert, oriented and cooperative. Patient is in no acute distress.  Skin: Skin is warm and dry. No rash noted.   Cardiovascular: Normal heart rate noted  Respiratory: Normal respiratory effort, no problems with respiration noted  Abdomen: Soft, gravid, appropriate for gestational age.  Pain/Pressure: Present     Pelvic: Cervical exam performed        Extremities: Normal range of motion.  Edema: Trace  Mental Status:  Normal mood and affect. Normal behavior. Normal judgment and thought content.   Assessment and Plan:  Pregnancy: G2P1001 at [redacted]w[redacted]d  1. Supervision of high risk pregnancy, antepartum H/o suspected IUGR - Culture, beta strep (group b only) - GC/Chlamydia probe amp ([place])not at [facility]  2. Hashimoto's thyroiditis  - Culture, beta strep (group b only) - GC/Chlamydia probe amp ([place])not at [facility]  3. Bipolar 1 disorder (HCC)   4. Suspected abnormality of fetus  affecting management of mother in singleton pregnancy  - Culture, beta strep (group b only) - GC/Chlamydia probe amp ([place])not at [facility]  5. History of poor fetal growth Growth [place] in 1.5.weeks  Preterm labor symptoms and general obstetric precautions including but not limited to vaginal bleeding, contractions, leaking of fluid and fetal movement were reviewed in detail with the patient. Please refer to After Visit Summary for other counseling recommendations.  Return in about 1 week (around 11/24/2016).   Scheryl Darter, MD

## 2016-11-17 NOTE — Patient Instructions (Signed)
Vaginal Delivery Vaginal delivery means that you will give birth by pushing your baby out of your birth canal (vagina). A team of health care providers will help you before, during, and after vaginal delivery. Birth experiences are unique for every woman and every pregnancy, and birth experiences vary depending on where you choose to give birth. What should I do to prepare for my baby's birth? Before your baby is born, it is important to talk with your health care provider about:  Your labor and delivery preferences. These may include: ? Medicines that you may be given. ? How you will manage your pain. This might include non-medical pain relief techniques or injectable pain relief such as epidural analgesia. ? How you and your baby will be monitored during labor and delivery. ? Who may be in the labor and delivery room with you. ? Your feelings about surgical delivery of your baby (cesarean delivery, or C-section) if this becomes necessary. ? Your feelings about receiving donated blood through an IV tube (blood transfusion) if this becomes necessary.  Whether you are able: ? To take pictures or videos of the birth. ? To eat during labor and delivery. ? To move around, walk, or change positions during labor and delivery.  What to expect after your baby is born, such as: ? Whether delayed umbilical cord clamping and cutting is offered. ? Who will care for your baby right after birth. ? Medicines or tests that may be recommended for your baby. ? Whether breastfeeding is supported in your hospital or birth center. ? How long you will be in the hospital or birth center.  How any medical conditions you have may affect your baby or your labor and delivery experience.  To prepare for your baby's birth, you should also:  Attend all of your health care visits before delivery (prenatal visits) as recommended by your health care provider. This is important.  Prepare your home for your baby's  arrival. Make sure that you have: ? Diapers. ? Baby clothing. ? Feeding equipment. ? Safe sleeping arrangements for you and your baby.  Install a car seat in your vehicle. Have your car seat checked by a certified car seat installer to make sure that it is installed safely.  Think about who will help you with your new baby at home for at least the first several weeks after delivery.  What can I expect when I arrive at the birth center or hospital? Once you are in labor and have been admitted into the hospital or birth center, your health care provider may:  Review your pregnancy history and any concerns you have.  Insert an IV tube into one of your veins. This is used to give you fluids and medicines.  Check your blood pressure, pulse, temperature, and heart rate (vital signs).  Check whether your bag of water (amniotic sac) has broken (ruptured).  Talk with you about your birth plan and discuss pain control options.  Monitoring Your health care provider may monitor your contractions (uterine monitoring) and your baby's heart rate (fetal monitoring). You may need to be monitored:  Often, but not continuously (intermittently).  All the time or for long periods at a time (continuously). Continuous monitoring may be needed if: ? You are taking certain medicines, such as medicine to relieve pain or make your contractions stronger. ? You have pregnancy or labor complications.  Monitoring may be done by:  Placing a special stethoscope or a handheld monitoring device on your abdomen to   check your baby's heartbeat, and feeling your abdomen for contractions. This method of monitoring does not continuously record your baby's heartbeat or your contractions.  Placing monitors on your abdomen (external monitors) to record your baby's heartbeat and the frequency and length of contractions. You may not have to wear external monitors all the time.  Placing monitors inside of your uterus  (internal monitors) to record your baby's heartbeat and the frequency, length, and strength of your contractions. ? Your health care provider may use internal monitors if he or she needs more information about the strength of your contractions or your baby's heart rate. ? Internal monitors are put in place by passing a thin, flexible wire through your vagina and into your uterus. Depending on the type of monitor, it may remain in your uterus or on your baby's head until birth. ? Your health care provider will discuss the benefits and risks of internal monitoring with you and will ask for your permission before inserting the monitors.  Telemetry. This is a type of continuous monitoring that can be done with external or internal monitors. Instead of having to stay in bed, you are able to move around during telemetry. Ask your health care provider if telemetry is an option for you.  Physical exam Your health care provider may perform a physical exam. This may include:  Checking whether your baby is positioned: ? With the head toward your vagina (head-down). This is most common. ? With the head toward the top of your uterus (head-up or breech). If your baby is in a breech position, your health care provider may try to turn your baby to a head-down position so you can deliver vaginally. If it does not seem that your baby can be born vaginally, your provider may recommend surgery to deliver your baby. In rare cases, you may be able to deliver vaginally if your baby is head-up (breech delivery). ? Lying sideways (transverse). Babies that are lying sideways cannot be delivered vaginally.  Checking your cervix to determine: ? Whether it is thinning out (effacing). ? Whether it is opening up (dilating). ? How low your baby has moved into your birth canal.  What are the three stages of labor and delivery?  Normal labor and delivery is divided into the following three stages: Stage 1  Stage 1 is the  longest stage of labor, and it can last for hours or days. Stage 1 includes: ? Early labor. This is when contractions may be irregular, or regular and mild. Generally, early labor contractions are more than 10 minutes apart. ? Active labor. This is when contractions get longer, more regular, more frequent, and more intense. ? The transition phase. This is when contractions happen very close together, are very intense, and may last longer than during any other part of labor.  Contractions generally feel mild, infrequent, and irregular at first. They get stronger, more frequent (about every 2-3 minutes), and more regular as you progress from early labor through active labor and transition.  Many women progress through stage 1 naturally, but you may need help to continue making progress. If this happens, your health care provider may talk with you about: ? Rupturing your amniotic sac if it has not ruptured yet. ? Giving you medicine to help make your contractions stronger and more frequent.  Stage 1 ends when your cervix is completely dilated to 4 inches (10 cm) and completely effaced. This happens at the end of the transition phase. Stage 2  Once   your cervix is completely effaced and dilated to 4 inches (10 cm), you may start to feel an urge to push. It is common for the body to naturally take a rest before feeling the urge to push, especially if you received an epidural or certain other pain medicines. This rest period may last for up to 1-2 hours, depending on your unique labor experience.  During stage 2, contractions are generally less painful, because pushing helps relieve contraction pain. Instead of contraction pain, you may feel stretching and burning pain, especially when the widest part of your baby's head passes through the vaginal opening (crowning).  Your health care provider will closely monitor your pushing progress and your baby's progress through the vagina during stage 2.  Your  health care provider may massage the area of skin between your vaginal opening and anus (perineum) or apply warm compresses to your perineum. This helps it stretch as the baby's head starts to crown, which can help prevent perineal tearing. ? In some cases, an incision may be made in your perineum (episiotomy) to allow the baby to pass through the vaginal opening. An episiotomy helps to make the opening of the vagina larger to allow more room for the baby to fit through.  It is very important to breathe and focus so your health care provider can control the delivery of your baby's head. Your health care provider may have you decrease the intensity of your pushing, to help prevent perineal tearing.  After delivery of your baby's head, the shoulders and the rest of the body generally deliver very quickly and without difficulty.  Once your baby is delivered, the umbilical cord may be cut right away, or this may be delayed for 1-2 minutes, depending on your baby's health. This may vary among health care providers, hospitals, and birth centers.  If you and your baby are healthy enough, your baby may be placed on your chest or abdomen to help maintain the baby's temperature and to help you bond with each other. Some mothers and babies start breastfeeding at this time. Your health care team will dry your baby and help keep your baby warm during this time.  Your baby may need immediate care if he or she: ? Showed signs of distress during labor. ? Has a medical condition. ? Was born too early (prematurely). ? Had a bowel movement before birth (meconium). ? Shows signs of difficulty transitioning from being inside the uterus to being outside of the uterus. If you are planning to breastfeed, your health care team will help you begin a feeding. Stage 3  The third stage of labor starts immediately after the birth of your baby and ends after you deliver the placenta. The placenta is an organ that develops  during pregnancy to provide oxygen and nutrients to your baby in the womb.  Delivering the placenta may require some pushing, and you may have mild contractions. Breastfeeding can stimulate contractions to help you deliver the placenta.  After the placenta is delivered, your uterus should tighten (contract) and become firm. This helps to stop bleeding in your uterus. To help your uterus contract and to control bleeding, your health care provider may: ? Give you medicine by injection, through an IV tube, by mouth, or through your rectum (rectally). ? Massage your abdomen or perform a vaginal exam to remove any blood clots that are left in your uterus. ? Empty your bladder by placing a thin, flexible tube (catheter) into your bladder. ? Encourage   you to breastfeed your baby. After labor is over, you and your baby will be monitored closely to ensure that you are both healthy until you are ready to go home. Your health care team will teach you how to care for yourself and your baby. This information is not intended to replace advice given to you by your health care provider. Make sure you discuss any questions you have with your health care provider. Document Released: 11/20/2007 Document Revised: 08/31/2015 Document Reviewed: 02/25/2015 Elsevier Interactive Patient Education  2018 [employer]  

## 2016-11-18 LAB — GC/CHLAMYDIA PROBE AMP (~~LOC~~) NOT AT ARMC
Chlamydia: NEGATIVE
Neisseria Gonorrhea: NEGATIVE

## 2016-11-20 ENCOUNTER — Other Ambulatory Visit (HOSPITAL_COMMUNITY): Payer: Medicaid Other

## 2016-11-20 LAB — CULTURE, BETA STREP (GROUP B ONLY): STREP GP B CULTURE: POSITIVE — AB

## 2016-11-21 ENCOUNTER — Ambulatory Visit (HOSPITAL_COMMUNITY)
Admission: RE | Admit: 2016-11-21 | Discharge: 2016-11-21 | Disposition: A | Discharge status: Home / Self Care | Payer: Medicaid Other | Source: Ambulatory Visit | Attending: Maternal and Fetal Medicine | Admitting: Maternal and Fetal Medicine

## 2016-11-21 ENCOUNTER — Encounter (HOSPITAL_COMMUNITY): Payer: Self-pay

## 2016-11-21 ENCOUNTER — Other Ambulatory Visit (HOSPITAL_COMMUNITY): Payer: Self-pay | Admitting: Maternal and Fetal Medicine

## 2016-11-21 DIAGNOSIS — E079 Disorder of thyroid, unspecified: Secondary | ICD-10-CM | POA: Diagnosis not present

## 2016-11-21 DIAGNOSIS — O365930 Maternal care for other known or suspected poor fetal growth, third trimester, not applicable or unspecified: Secondary | ICD-10-CM | POA: Insufficient documentation

## 2016-11-21 DIAGNOSIS — Z3A36 36 weeks gestation of pregnancy: Secondary | ICD-10-CM

## 2016-11-21 DIAGNOSIS — O09293 Supervision of pregnancy with other poor reproductive or obstetric history, third trimester: Secondary | ICD-10-CM | POA: Diagnosis not present

## 2016-11-21 DIAGNOSIS — O365990 Maternal care for other known or suspected poor fetal growth, unspecified trimester, not applicable or unspecified: Secondary | ICD-10-CM

## 2016-11-21 DIAGNOSIS — Z8489 Family history of other specified conditions: Secondary | ICD-10-CM | POA: Diagnosis not present

## 2016-11-21 DIAGNOSIS — O99213 Obesity complicating pregnancy, third trimester: Secondary | ICD-10-CM | POA: Diagnosis not present

## 2016-11-21 DIAGNOSIS — O352XX0 Maternal care for (suspected) hereditary disease in fetus, not applicable or unspecified: Secondary | ICD-10-CM | POA: Diagnosis not present

## 2016-11-21 DIAGNOSIS — O99283 Endocrine, nutritional and metabolic diseases complicating pregnancy, third trimester: Secondary | ICD-10-CM | POA: Insufficient documentation

## 2016-11-24 ENCOUNTER — Ambulatory Visit (INDEPENDENT_AMBULATORY_CARE_PROVIDER_SITE_OTHER): Payer: Medicaid Other | Admitting: Advanced Practice Midwife

## 2016-11-24 VITALS — BP 119/61 | HR 95 | Wt 246.6 lb

## 2016-11-24 DIAGNOSIS — O0993 Supervision of high risk pregnancy, unspecified, third trimester: Secondary | ICD-10-CM

## 2016-11-24 DIAGNOSIS — F319 Bipolar disorder, unspecified: Secondary | ICD-10-CM

## 2016-11-24 DIAGNOSIS — E063 Autoimmune thyroiditis: Secondary | ICD-10-CM

## 2016-11-24 DIAGNOSIS — Z87898 Personal history of other specified conditions: Secondary | ICD-10-CM

## 2016-11-24 DIAGNOSIS — O0990 Supervision of high risk pregnancy, unspecified, unspecified trimester: Secondary | ICD-10-CM

## 2016-11-24 DIAGNOSIS — O359XX0 Maternal care for (suspected) fetal abnormality and damage, unspecified, not applicable or unspecified: Secondary | ICD-10-CM

## 2016-11-24 NOTE — Patient Instructions (Signed)
Third Trimester of Pregnancy The third trimester is from week 28 through week 40 (months 7 through 9). The third trimester is a time when the unborn baby (fetus) is growing rapidly. At the end of the ninth month, the fetus is about 20 inches in length and weighs 6-10 pounds. Body changes during your third trimester Your body will continue to go through many changes during pregnancy. The changes vary from woman to woman. During the third trimester:  Your weight will continue to increase. You can expect to gain 25-35 pounds (11-16 kg) by the end of the pregnancy.  You may begin to get stretch marks on your hips, abdomen, and breasts.  You may urinate more often because the fetus is moving lower into your pelvis and pressing on your bladder.  You may develop or continue to have heartburn. This is caused by increased hormones that slow down muscles in the digestive tract.  You may develop or continue to have constipation because increased hormones slow digestion and cause the muscles that push waste through your intestines to relax.  You may develop hemorrhoids. These are swollen veins (varicose veins) in the rectum that can itch or be painful.  You may develop swollen, bulging veins (varicose veins) in your legs.  You may have increased body aches in the pelvis, back, or thighs. This is due to weight gain and increased hormones that are relaxing your joints.  You may have changes in your hair. These can include thickening of your hair, rapid growth, and changes in texture. Some women also have hair loss during or after pregnancy, or hair that feels dry or thin. Your hair will most likely return to normal after your baby is born.  Your breasts will continue to grow and they will continue to become tender. A yellow fluid (colostrum) may leak from your breasts. This is the first milk you are producing for your baby.  Your belly button may stick out.  You may notice more swelling in your hands,  face, or ankles.  You may have increased tingling or numbness in your hands, arms, and legs. The skin on your belly may also feel numb.  You may feel short of breath because of your expanding uterus.  You may have more problems sleeping. This can be caused by the size of your belly, increased need to urinate, and an increase in your body's metabolism.  You may notice the fetus "dropping," or moving lower in your abdomen (lightening).  You may have increased vaginal discharge.  You may notice your joints feel loose and you may have pain around your pelvic bone.  What to expect at prenatal visits You will have prenatal exams every 2 weeks until week 36. Then you will have weekly prenatal exams. During a routine prenatal visit:  You will be weighed to make sure you and the baby are growing normally.  Your blood pressure will be taken.  Your abdomen will be measured to track your baby's growth.  The fetal heartbeat will be listened to.  Any test results from the previous visit will be discussed.  You may have a cervical check near your due date to see if your cervix has softened or thinned (effaced).  You will be tested for Group B streptococcus. This happens between 35 and 37 weeks.  Your health care provider may ask you:  What your birth plan is.  How you are feeling.  If you are feeling the baby move.  If you have had   any abnormal symptoms, such as leaking fluid, bleeding, severe headaches, or abdominal cramping.  If you are using any tobacco products, including cigarettes, chewing tobacco, and electronic cigarettes.  If you have any questions.  Other tests or screenings that may be performed during your third trimester include:  Blood tests that check for low iron levels (anemia).  Fetal testing to check the health, activity level, and growth of the fetus. Testing is done if you have certain medical conditions or if there are problems during the  pregnancy.  Nonstress test (NST). This test checks the health of your baby to make sure there are no signs of problems, such as the baby not getting enough oxygen. During this test, a belt is placed around your belly. The baby is made to move, and its heart rate is monitored during movement.  What is false labor? False labor is a condition in which you feel small, irregular tightenings of the muscles in the womb (contractions) that usually go away with rest, changing position, or drinking water. These are called Braxton Hicks contractions. Contractions may last for hours, days, or even weeks before true labor sets in. If contractions come at regular intervals, become more frequent, increase in intensity, or become painful, you should see your health care provider. What are the signs of labor?  Abdominal cramps.  Regular contractions that start at 10 minutes apart and become stronger and more frequent with time.  Contractions that start on the top of the uterus and spread down to the lower abdomen and back.  Increased pelvic pressure and dull back pain.  A watery or bloody mucus discharge that comes from the vagina.  Leaking of amniotic fluid. This is also known as your "water breaking." It could be a slow trickle or a gush. Let your health care provider know if it has a color or strange odor. If you have any of these signs, call your health care provider right away, even if it is before your due date. Follow these instructions at home: Medicines  Follow your health care provider's instructions regarding medicine use. Specific medicines may be either safe or unsafe to take during pregnancy.  Take a prenatal vitamin that contains at least 600 micrograms (mcg) of folic acid.  If you develop constipation, try taking a stool softener if your health care provider approves. Eating and drinking  Eat a balanced diet that includes fresh fruits and vegetables, whole grains, good sources of protein  such as meat, eggs, or tofu, and low-fat dairy. Your health care provider will help you determine the amount of weight gain that is right for you.  Avoid raw meat and uncooked cheese. These carry germs that can cause birth defects in the baby.  If you have low calcium intake from food, talk to your health care provider about whether you should take a daily calcium supplement.  Eat four or five small meals rather than three large meals a day.  Limit foods that are high in fat and processed sugars, such as fried and sweet foods.  To prevent constipation: ? Drink enough fluid to keep your urine clear or pale yellow. ? Eat foods that are high in fiber, such as fresh fruits and vegetables, whole grains, and beans. Activity  Exercise only as directed by your health care provider. Most women can continue their usual exercise routine during pregnancy. Try to exercise for 30 minutes at least 5 days a week. Stop exercising if you experience uterine contractions.  Avoid heavy   lifting.  Do not exercise in extreme heat or humidity, or at high altitudes.  Wear low-heel, comfortable shoes.  Practice good posture.  You may continue to have sex unless your health care provider tells you otherwise. Relieving pain and discomfort  Take frequent breaks and rest with your legs elevated if you have leg cramps or low back pain.  Take warm sitz baths to soothe any pain or discomfort caused by hemorrhoids. Use hemorrhoid cream if your health care provider approves.  Wear a good support bra to prevent discomfort from breast tenderness.  If you develop varicose veins: ? Wear support pantyhose or compression stockings as told by your healthcare provider. ? Elevate your feet for 15 minutes, 3-4 times a day. Prenatal care  Write down your questions. Take them to your prenatal visits.  Keep all your prenatal visits as told by your health care provider. This is important. Safety  Wear your seat belt at  all times when driving.  Make a list of emergency phone numbers, including numbers for family, friends, the hospital, and police and fire departments. General instructions  Avoid cat litter boxes and soil used by cats. These carry germs that can cause birth defects in the baby. If you have a cat, ask someone to clean the litter box for you.  Do not travel far distances unless it is absolutely necessary and only with the approval of your health care provider.  Do not use hot tubs, steam rooms, or saunas.  Do not drink alcohol.  Do not use any products that contain nicotine or tobacco, such as cigarettes and e-cigarettes. If you need help quitting, ask your health care provider.  Do not use any medicinal herbs or unprescribed drugs. These chemicals affect the formation and growth of the baby.  Do not douche or use tampons or scented sanitary pads.  Do not cross your legs for long periods of time.  To prepare for the arrival of your baby: ? Take prenatal classes to understand, practice, and ask questions about labor and delivery. ? Make a trial run to the hospital. ? Visit the hospital and tour the maternity area. ? Arrange for maternity or paternity leave through employers. ? Arrange for family and friends to take care of pets while you are in the hospital. ? Purchase a rear-facing car seat and make sure you know how to install it in your car. ? Pack your hospital bag. ? Prepare the baby's nursery. Make sure to remove all pillows and stuffed animals from the baby's crib to prevent suffocation.  Visit your dentist if you have not gone during your pregnancy. Use a soft toothbrush to brush your teeth and be gentle when you floss. Contact a health care provider if:  You are unsure if you are in labor or if your water has broken.  You become dizzy.  You have mild pelvic cramps, pelvic pressure, or nagging pain in your abdominal area.  You have lower back pain.  You have persistent  nausea, vomiting, or diarrhea.  You have an unusual or bad smelling vaginal discharge.  You have pain when you urinate. Get help right away if:  Your water breaks before 37 weeks.  You have regular contractions less than 5 minutes apart before 37 weeks.  You have a fever.  You are leaking fluid from your vagina.  You have spotting or bleeding from your vagina.  You have severe abdominal pain or cramping.  You have rapid weight loss or weight gain.    You have shortness of breath with chest pain.  You notice sudden or extreme swelling of your face, hands, ankles, feet, or legs.  Your baby makes fewer than 10 movements in 2 hours.  You have severe headaches that do not go away when you take medicine.  You have vision changes. Summary  The third trimester is from week 28 through week 40, months 7 through 9. The third trimester is a time when the unborn baby (fetus) is growing rapidly.  During the third trimester, your discomfort may increase as you and your baby continue to gain weight. You may have abdominal, leg, and back pain, sleeping problems, and an increased need to urinate.  During the third trimester your breasts will keep growing and they will continue to become tender. A yellow fluid (colostrum) may leak from your breasts. This is the first milk you are producing for your baby.  False labor is a condition in which you feel small, irregular tightenings of the muscles in the womb (contractions) that eventually go away. These are called Braxton Hicks contractions. Contractions may last for hours, days, or even weeks before true labor sets in.  Signs of labor can include: abdominal cramps; regular contractions that start at 10 minutes apart and become stronger and more frequent with time; watery or bloody mucus discharge that comes from the vagina; increased pelvic pressure and dull back pain; and leaking of amniotic fluid. This information is not intended to replace advice  given to you by your health care provider. Make sure you discuss any questions you have with your health care provider. Document Released: 02/04/2001 Document Revised: 07/19/2015 Document Reviewed: 04/13/2012 Elsevier Interactive Patient Education  2017 [employer]  

## 2016-11-24 NOTE — Progress Notes (Signed)
Patient states that she was dizzy last night and this morning.

## 2016-11-24 NOTE — Progress Notes (Signed)
   PRENATAL VISIT NOTE  Subjective:  Alyssa Hartman is a 21 y.o. G2P1001 at [redacted]w[redacted]d being seen today for ongoing prenatal care.  She is currently monitored for the following issues for this high-risk pregnancy and has Supervision of high risk pregnancy, antepartum; Bipolar 1 disorder (HCC); Hashimoto's thyroiditis; Obesity; Suspected abnormality of fetus affecting management of mother in singleton pregnancy; and History of poor fetal growth on her problem list.  Patient reports no complaints.  Contractions: Not present. Vag. Bleeding: None.  Movement: Present. Denies leaking of fluid.   The following portions of the patient's history were reviewed and updated as appropriate: allergies, current medications, past family history, past medical history, past social history, past surgical history and problem list. Problem list updated.  Objective:   Vitals:   11/24/16 1528  BP: 119/61  Pulse: 95  Weight: 246 lb 9.6 oz (111.9 kg)    Fetal Status: Fetal Heart Rate (bpm): 128   Movement: Present     General:  Alert, oriented and cooperative. Patient is in no acute distress.  Skin: Skin is warm and dry. No rash noted.   Cardiovascular: Normal heart rate noted  Respiratory: Normal respiratory effort, no problems with respiration noted  Abdomen: Soft, gravid, appropriate for gestational age.  Pain/Pressure: Present     Pelvic: Cervical exam deferred        Extremities: Normal range of motion.  Edema: Trace  Mental Status:  Normal mood and affect. Normal behavior. Normal judgment and thought content.   Assessment and Plan:  Pregnancy: G2P1001 at [redacted]w[redacted]d  1. Supervision of high risk pregnancy, antepartum   2. History of poor fetal growth - Weekly BPP next one scheduled on 11/27/16   3. Hashimoto's thyroiditis - TSH 3.750 on 11/03/16   4. Bipolar 1 disorder (HCC) - Has not been on meds x 2 years.   5. Suspected abnormality of fetus affecting management of mother in singleton pregnancy -  Weekly BPP with MFM  Preterm labor symptoms and general obstetric precautions including but not limited to vaginal bleeding, contractions, leaking of fluid and fetal movement were reviewed in detail with the patient. Please refer to After Visit Summary for other counseling recommendations.  Return in about 1 week (around 12/01/2016).   Thressa Sheller, CNM

## 2016-11-27 ENCOUNTER — Other Ambulatory Visit (HOSPITAL_COMMUNITY): Payer: Self-pay | Admitting: Maternal and Fetal Medicine

## 2016-11-27 ENCOUNTER — Encounter (HOSPITAL_COMMUNITY): Payer: Self-pay

## 2016-11-27 ENCOUNTER — Ambulatory Visit (HOSPITAL_COMMUNITY)
Admission: RE | Admit: 2016-11-27 | Discharge: 2016-11-27 | Disposition: A | Discharge status: Home / Self Care | Payer: Medicaid Other | Source: Ambulatory Visit | Attending: Advanced Practice Midwife | Admitting: Advanced Practice Midwife

## 2016-11-27 DIAGNOSIS — O365990 Maternal care for other known or suspected poor fetal growth, unspecified trimester, not applicable or unspecified: Secondary | ICD-10-CM

## 2016-11-27 DIAGNOSIS — Z3A37 37 weeks gestation of pregnancy: Secondary | ICD-10-CM | POA: Insufficient documentation

## 2016-11-27 DIAGNOSIS — O09293 Supervision of pregnancy with other poor reproductive or obstetric history, third trimester: Secondary | ICD-10-CM | POA: Diagnosis not present

## 2016-11-27 DIAGNOSIS — O99210 Obesity complicating pregnancy, unspecified trimester: Secondary | ICD-10-CM

## 2016-11-27 DIAGNOSIS — O365930 Maternal care for other known or suspected poor fetal growth, third trimester, not applicable or unspecified: Secondary | ICD-10-CM | POA: Insufficient documentation

## 2016-11-27 DIAGNOSIS — O352XX0 Maternal care for (suspected) hereditary disease in fetus, not applicable or unspecified: Secondary | ICD-10-CM | POA: Insufficient documentation

## 2016-11-27 DIAGNOSIS — Z8489 Family history of other specified conditions: Secondary | ICD-10-CM

## 2016-11-28 ENCOUNTER — Other Ambulatory Visit (HOSPITAL_COMMUNITY): Payer: Self-pay | Admitting: *Deleted

## 2016-12-04 ENCOUNTER — Encounter (HOSPITAL_COMMUNITY): Payer: Self-pay

## 2016-12-04 ENCOUNTER — Inpatient Hospital Stay (HOSPITAL_COMMUNITY)
Admission: AD | Admit: 2016-12-04 | Discharge: 2016-12-06 | DRG: 807 | Disposition: A | Discharge status: Home / Self Care | Payer: Medicaid Other | Source: Ambulatory Visit | Attending: Family Medicine | Admitting: Family Medicine

## 2016-12-04 ENCOUNTER — Encounter: Payer: Medicaid Other | Admitting: Obstetrics & Gynecology

## 2016-12-04 ENCOUNTER — Inpatient Hospital Stay (EMERGENCY_DEPARTMENT_HOSPITAL)
Admission: AD | Admit: 2016-12-04 | Discharge: 2016-12-04 | Disposition: A | Discharge status: Home / Self Care | Payer: Medicaid Other | Source: Ambulatory Visit | Attending: Family Medicine | Admitting: Family Medicine

## 2016-12-04 ENCOUNTER — Ambulatory Visit (INDEPENDENT_AMBULATORY_CARE_PROVIDER_SITE_OTHER): Payer: Medicaid Other | Admitting: Obstetrics & Gynecology

## 2016-12-04 ENCOUNTER — Ambulatory Visit (HOSPITAL_COMMUNITY)
Admission: RE | Admit: 2016-12-04 | Discharge: 2016-12-04 | Disposition: A | Discharge status: Home / Self Care | Payer: Medicaid Other | Source: Ambulatory Visit | Attending: Obstetrics & Gynecology | Admitting: Obstetrics & Gynecology

## 2016-12-04 ENCOUNTER — Encounter (HOSPITAL_COMMUNITY): Payer: Self-pay | Admitting: *Deleted

## 2016-12-04 VITALS — BP 112/59 | HR 93 | Wt 247.0 lb

## 2016-12-04 DIAGNOSIS — O479 False labor, unspecified: Secondary | ICD-10-CM | POA: Diagnosis not present

## 2016-12-04 DIAGNOSIS — Z3A38 38 weeks gestation of pregnancy: Secondary | ICD-10-CM | POA: Diagnosis not present

## 2016-12-04 DIAGNOSIS — O99824 Streptococcus B carrier state complicating childbirth: Secondary | ICD-10-CM | POA: Diagnosis present

## 2016-12-04 DIAGNOSIS — R768 Other specified abnormal immunological findings in serum: Secondary | ICD-10-CM | POA: Diagnosis present

## 2016-12-04 DIAGNOSIS — O0990 Supervision of high risk pregnancy, unspecified, unspecified trimester: Secondary | ICD-10-CM

## 2016-12-04 DIAGNOSIS — E063 Autoimmune thyroiditis: Secondary | ICD-10-CM | POA: Diagnosis present

## 2016-12-04 DIAGNOSIS — O9989 Other specified diseases and conditions complicating pregnancy, childbirth and the puerperium: Secondary | ICD-10-CM

## 2016-12-04 DIAGNOSIS — Z87891 Personal history of nicotine dependence: Secondary | ICD-10-CM

## 2016-12-04 DIAGNOSIS — Z87898 Personal history of other specified conditions: Secondary | ICD-10-CM

## 2016-12-04 DIAGNOSIS — M549 Dorsalgia, unspecified: Secondary | ICD-10-CM

## 2016-12-04 DIAGNOSIS — O99891 Other specified diseases and conditions complicating pregnancy: Secondary | ICD-10-CM

## 2016-12-04 DIAGNOSIS — O359XX0 Maternal care for (suspected) fetal abnormality and damage, unspecified, not applicable or unspecified: Secondary | ICD-10-CM

## 2016-12-04 DIAGNOSIS — O0993 Supervision of high risk pregnancy, unspecified, third trimester: Secondary | ICD-10-CM

## 2016-12-04 DIAGNOSIS — O99284 Endocrine, nutritional and metabolic diseases complicating childbirth: Secondary | ICD-10-CM | POA: Diagnosis present

## 2016-12-04 DIAGNOSIS — O26893 Other specified pregnancy related conditions, third trimester: Secondary | ICD-10-CM | POA: Diagnosis present

## 2016-12-04 DIAGNOSIS — Z0371 Encounter for suspected problem with amniotic cavity and membrane ruled out: Secondary | ICD-10-CM

## 2016-12-04 DIAGNOSIS — Z6841 Body Mass Index (BMI) 40.0 and over, adult: Secondary | ICD-10-CM

## 2016-12-04 DIAGNOSIS — F319 Bipolar disorder, unspecified: Secondary | ICD-10-CM

## 2016-12-04 LAB — CBC
HEMATOCRIT: 39 % (ref 36.0–46.0)
HEMOGLOBIN: 13.1 g/dL (ref 12.0–15.0)
MCH: 26.4 pg (ref 26.0–34.0)
MCHC: 33.6 g/dL (ref 30.0–36.0)
MCV: 78.6 fL (ref 78.0–100.0)
Platelets: 175 10*3/uL (ref 150–400)
RBC: 4.96 MIL/uL (ref 3.87–5.11)
RDW: 15.9 % — ABNORMAL HIGH (ref 11.5–15.5)
WBC: 13 10*3/uL — ABNORMAL HIGH (ref 4.0–10.5)

## 2016-12-04 LAB — TYPE AND SCREEN
ABO/RH(D): A POS
Antibody Screen: NEGATIVE

## 2016-12-04 LAB — ABO/RH: ABO/RH(D): A POS

## 2016-12-04 LAB — POCT FERN TEST: POCT Fern Test: NEGATIVE

## 2016-12-04 MED ORDER — COCONUT OIL OIL: 1.0000 "application " | TOPICAL_OIL | Status: DC | PRN

## 2016-12-04 MED ORDER — WITCH HAZEL-GLYCERIN EX PADS: 1.0000 "application " | MEDICATED_PAD | CUTANEOUS | Status: DC | PRN

## 2016-12-04 MED ORDER — ACETAMINOPHEN 325 MG PO TABS: 650.0000 mg | ORAL_TABLET | ORAL | Status: DC | PRN

## 2016-12-04 MED ORDER — LACTATED RINGERS IV SOLN
500.0000 mL | Freq: Once | INTRAVENOUS | Status: AC
  Administered 2016-12-04: 500 mL via INTRAVENOUS

## 2016-12-04 MED ORDER — DIBUCAINE 1 % RE OINT: 1.0000 "application " | TOPICAL_OINTMENT | RECTAL | Status: DC | PRN

## 2016-12-04 MED ORDER — LACTATED RINGERS IV SOLN: 500.0000 mL | INTRAVENOUS | Status: DC | PRN

## 2016-12-04 MED ORDER — BISACODYL 10 MG RE SUPP: 10.0000 mg | Freq: Every day | RECTAL | Status: DC | PRN

## 2016-12-04 MED ORDER — PRENATAL MULTIVITAMIN CH
1.0000 | ORAL_TABLET | Freq: Every day | ORAL | Status: DC
  Administered 2016-12-05 – 2016-12-06 (×2): 1 via ORAL
  Filled 2016-12-04 (×2): qty 1

## 2016-12-04 MED ORDER — FENTANYL CITRATE (PF) 100 MCG/2ML IJ SOLN
100.0000 ug | INTRAMUSCULAR | Status: DC | PRN
  Administered 2016-12-04: 100 ug via INTRAVENOUS

## 2016-12-04 MED ORDER — TETANUS-DIPHTH-ACELL PERTUSSIS 5-2.5-18.5 LF-MCG/0.5 IM SUSP: 0.5000 mL | Freq: Once | INTRAMUSCULAR | Status: DC

## 2016-12-04 MED ORDER — DEXTROSE 5 % IV SOLN
5.0000 10*6.[IU] | Freq: Once | INTRAVENOUS | Status: DC
  Filled 2016-12-04: qty 5

## 2016-12-04 MED ORDER — SOD CITRATE-CITRIC ACID 500-334 MG/5ML PO SOLN: 30.0000 mL | ORAL | Status: DC | PRN

## 2016-12-04 MED ORDER — IBUPROFEN 600 MG PO TABS
600.0000 mg | ORAL_TABLET | Freq: Four times a day (QID) | ORAL | Status: DC
  Administered 2016-12-04 – 2016-12-06 (×8): 600 mg via ORAL
  Filled 2016-12-04 (×8): qty 1

## 2016-12-04 MED ORDER — MEASLES, MUMPS & RUBELLA VAC ~~LOC~~ INJ: 0.5000 mL | INJECTION | Freq: Once | SUBCUTANEOUS | Status: DC

## 2016-12-04 MED ORDER — EPHEDRINE 5 MG/ML INJ
10.0000 mg | INTRAVENOUS | Status: DC | PRN
  Filled 2016-12-04: qty 2

## 2016-12-04 MED ORDER — METHYLERGONOVINE MALEATE 0.2 MG PO TABS: 0.2000 mg | ORAL_TABLET | ORAL | Status: DC | PRN

## 2016-12-04 MED ORDER — ONDANSETRON HCL 4 MG PO TABS: 4.0000 mg | ORAL_TABLET | ORAL | Status: DC | PRN

## 2016-12-04 MED ORDER — DIPHENHYDRAMINE HCL 50 MG/ML IJ SOLN: 12.5000 mg | INTRAMUSCULAR | Status: DC | PRN

## 2016-12-04 MED ORDER — OXYTOCIN BOLUS FROM INFUSION
500.0000 mL | Freq: Once | INTRAVENOUS | Status: AC
  Administered 2016-12-04: 500 mL via INTRAVENOUS

## 2016-12-04 MED ORDER — OXYTOCIN 10 UNIT/ML IJ SOLN
10.0000 [IU] | Freq: Once | INTRAMUSCULAR | Status: DC | PRN
  Filled 2016-12-04: qty 1

## 2016-12-04 MED ORDER — CYCLOBENZAPRINE HCL 10 MG PO TABS: 5.0000 mg | ORAL_TABLET | Freq: Three times a day (TID) | ORAL | 0 refills | Status: DC | PRN

## 2016-12-04 MED ORDER — DIPHENHYDRAMINE HCL 25 MG PO CAPS: 25.0000 mg | ORAL_CAPSULE | Freq: Four times a day (QID) | ORAL | Status: DC | PRN

## 2016-12-04 MED ORDER — LIDOCAINE HCL (PF) 1 % IJ SOLN
30.0000 mL | INTRAMUSCULAR | Status: AC | PRN
  Administered 2016-12-04: 30 mL via SUBCUTANEOUS
  Filled 2016-12-04: qty 30

## 2016-12-04 MED ORDER — PHENYLEPHRINE 40 MCG/ML (10ML) SYRINGE FOR IV PUSH (FOR BLOOD PRESSURE SUPPORT)
80.0000 ug | PREFILLED_SYRINGE | INTRAVENOUS | Status: DC | PRN
  Filled 2016-12-04: qty 5
  Filled 2016-12-04: qty 10

## 2016-12-04 MED ORDER — ONDANSETRON HCL 4 MG/2ML IJ SOLN: 4.0000 mg | INTRAMUSCULAR | Status: DC | PRN

## 2016-12-04 MED ORDER — PHENYLEPHRINE 40 MCG/ML (10ML) SYRINGE FOR IV PUSH (FOR BLOOD PRESSURE SUPPORT)
80.0000 ug | PREFILLED_SYRINGE | INTRAVENOUS | Status: DC | PRN
  Filled 2016-12-04: qty 5

## 2016-12-04 MED ORDER — OXYCODONE-ACETAMINOPHEN 5-325 MG PO TABS
1.0000 | ORAL_TABLET | ORAL | Status: DC | PRN
Start: 2016-12-04 — End: 2016-12-04

## 2016-12-04 MED ORDER — PENICILLIN G POT IN DEXTROSE 60000 UNIT/ML IV SOLN
3.0000 10*6.[IU] | INTRAVENOUS | Status: DC
  Filled 2016-12-04 (×2): qty 50

## 2016-12-04 MED ORDER — FENTANYL CITRATE (PF) 100 MCG/2ML IJ SOLN
INTRAMUSCULAR | Status: AC
  Filled 2016-12-04: qty 2

## 2016-12-04 MED ORDER — DOCUSATE SODIUM 100 MG PO CAPS
100.0000 mg | ORAL_CAPSULE | Freq: Two times a day (BID) | ORAL | Status: DC
  Administered 2016-12-05 (×2): 100 mg via ORAL
  Filled 2016-12-04 (×4): qty 1

## 2016-12-04 MED ORDER — LACTATED RINGERS IV SOLN
INTRAVENOUS | Status: DC
  Administered 2016-12-04: 17:00:00 via INTRAVENOUS

## 2016-12-04 MED ORDER — OXYCODONE HCL 5 MG PO TABS: 10.0000 mg | ORAL_TABLET | ORAL | Status: DC | PRN

## 2016-12-04 MED ORDER — SIMETHICONE 80 MG PO CHEW: 80.0000 mg | CHEWABLE_TABLET | ORAL | Status: DC | PRN

## 2016-12-04 MED ORDER — SODIUM CHLORIDE 0.9 % IV SOLN
2.0000 g | Freq: Once | INTRAVENOUS | Status: DC
  Filled 2016-12-04: qty 2000

## 2016-12-04 MED ORDER — FERROUS SULFATE 325 (65 FE) MG PO TABS
325.0000 mg | ORAL_TABLET | Freq: Two times a day (BID) | ORAL | Status: DC
  Administered 2016-12-05 – 2016-12-06 (×4): 325 mg via ORAL
  Filled 2016-12-04 (×4): qty 1

## 2016-12-04 MED ORDER — ACETAMINOPHEN 325 MG PO TABS
650.0000 mg | ORAL_TABLET | ORAL | Status: DC | PRN
  Administered 2016-12-05: 650 mg via ORAL
  Filled 2016-12-04: qty 2

## 2016-12-04 MED ORDER — METHYLERGONOVINE MALEATE 0.2 MG/ML IJ SOLN: 0.2000 mg | INTRAMUSCULAR | Status: DC | PRN

## 2016-12-04 MED ORDER — FLEET ENEMA 7-19 GM/118ML RE ENEM: 1.0000 | ENEMA | Freq: Every day | RECTAL | Status: DC | PRN

## 2016-12-04 MED ORDER — ONDANSETRON HCL 4 MG/2ML IJ SOLN
4.0000 mg | Freq: Four times a day (QID) | INTRAMUSCULAR | Status: DC
  Administered 2016-12-04: 4 mg via INTRAVENOUS
  Filled 2016-12-04: qty 2

## 2016-12-04 MED ORDER — FENTANYL 2.5 MCG/ML BUPIVACAINE 1/10 % EPIDURAL INFUSION (WH - ANES)
14.0000 mL/h | INTRAMUSCULAR | Status: DC | PRN
  Filled 2016-12-04: qty 100

## 2016-12-04 MED ORDER — OXYCODONE HCL 5 MG PO TABS: 5.0000 mg | ORAL_TABLET | ORAL | Status: DC | PRN

## 2016-12-04 MED ORDER — ZOLPIDEM TARTRATE 5 MG PO TABS: 5.0000 mg | ORAL_TABLET | Freq: Every evening | ORAL | Status: DC | PRN

## 2016-12-04 MED ORDER — OXYCODONE-ACETAMINOPHEN 5-325 MG PO TABS: 2.0000 | ORAL_TABLET | ORAL | Status: DC | PRN

## 2016-12-04 MED ORDER — OXYTOCIN 40 UNITS IN LACTATED RINGERS INFUSION - SIMPLE MED
2.5000 [IU]/h | INTRAVENOUS | Status: DC
  Filled 2016-12-04: qty 1000

## 2016-12-04 MED ORDER — BENZOCAINE-MENTHOL 20-0.5 % EX AERO: 1.0000 "application " | INHALATION_SPRAY | CUTANEOUS | Status: DC | PRN

## 2016-12-04 NOTE — Progress Notes (Signed)
   PRENATAL VISIT NOTE  Subjective:  Alyssa Hartman is a 21 y.o. G2P1001 at [redacted]w[redacted]d being seen today for ongoing prenatal care.  She is currently monitored for the following issues for this low-risk pregnancy and has Supervision of high risk pregnancy, antepartum; Bipolar 1 disorder (HCC); Hashimoto's thyroiditis; Obesity; Suspected abnormality of fetus affecting management of mother in singleton pregnancy; and History of poor fetal growth on her problem list.  Patient reports occasional contractions and pelvic pressure.  Contractions: Not present. Vag. Bleeding: None.  Movement: Present. Denies leaking of fluid.   The following portions of the patient's history were reviewed and updated as appropriate: allergies, current medications, past family history, past medical history, past social history, past surgical history and problem list. Problem list updated.  Objective:   Vitals:   12/04/16 1325  BP: (!) 112/59  Pulse: 93  Weight: 247 lb (112 kg)    Fetal Status: Fetal Heart Rate (bpm): NST   Movement: Present  Presentation: Vertex  General:  Alert, oriented and cooperative. Patient is in no acute distress.  Skin: Skin is warm and dry. No rash noted.   Cardiovascular: Normal heart rate noted  Respiratory: Normal respiratory effort, no problems with respiration noted  Abdomen: Soft, gravid, appropriate for gestational age.  Pain/Pressure: Present     Pelvic: Cervical exam performed Dilation: 2.5 Effacement (%): 60    Extremities: Normal range of motion.  Edema: None  Mental Status:  Normal mood and affect. Normal behavior. Normal judgment and thought content.   Assessment and Plan:  Pregnancy: G2P1001 at [redacted]w[redacted]d  There are no diagnoses linked to this encounter. Term labor symptoms and general obstetric precautions including but not limited to vaginal bleeding, contractions, leaking of fluid and fetal movement were reviewed in detail with the patient. Please refer to After Visit  Summary for other counseling recommendations.  Return in about 1 week (around 12/11/2016).   Scheryl Darter, MD

## 2016-12-04 NOTE — MAU Provider Note (Signed)
S: [name] is a 21 y.o. G2P1001 at [redacted]w[redacted]d  who presents to MAU today complaining of leaking of fluid since this morning. She denies vaginal bleeding. She endorses contractions. She reports normal fetal movement.    O: BP (!) 115/58   Pulse 92   Temp 98.3 F (36.8 C)   Resp 18   Ht  (1.575 m)   Wt 243 lb (110.2 kg)   LMP 03/07/2016 (Exact Date)   BMI 44.45 kg/m  GENERAL: Well-developed, well-nourished female in no acute distress.  HEAD: Normocephalic, atraumatic.  CHEST: Normal effort of breathing, regular heart rate ABDOMEN: Soft, nontender, gravid PELVIC: Normal external female genitalia. Vagina is pink and rugated. Cervix with normal contour, no lesions. Normal discharge.  Negative pooling.   Cervical exam:  Dilation: 1.5 Effacement (%): 50 Cervical Position: Posterior Station: Ballotable Presentation: Vertex Exam by:: L. Leftwich-Kirby, CNM   Fetal Monitoring: Baseline: 135 Variability: moderate Accelerations: present Decelerations: none Contractions: None on toco or to palpation  No results found for this or any previous visit (from the past 24 hour(s)).  Ferning negative  A: SIUP at [redacted]w[redacted]d  Membranes intact  Back pain in pregnancy  P: No evidence of ROM or active labor today F/U in [facility] office as scheduled Labor precautions/fetal movement counting reviewed Rest/ice/heat/Tylenol recommended.  Rx for Flexeril 5-10 mg TID PRN at pt request for medication.  Hurshel Party, CNM 12/04/2016 10:38 AM

## 2016-12-04 NOTE — H&P (Signed)
OBSTETRIC ADMISSION HISTORY AND PHYSICAL  Alyssa Hartman is a 21 y.o. female G2P1001 with IUP at [redacted]w[redacted]d by early U/S presenting for SOL. She reports +FMs, No LOF, no VB, no blurry vision, headaches or peripheral edema, and RUQ pain.  She plans on breast feeding. She is undecided for birth control . She received her prenatal care at [facility]   Dating: By early ultrasound --->  Estimated Date of Delivery: 12/17/16  Sono: , CWD, normal anatomy, cephalic presentation, placenta posterior above cervical os, 2249g, <10% EFW   Prenatal History/Complications:  Past Medical History: Past Medical History:  Diagnosis Date  . Hx of Hashimoto thyroiditis     Past Surgical History: Past Surgical History:  Procedure Laterality Date  . NO PAST SURGERIES      Obstetrical History: OB History    Gravida Para Term Preterm AB Living   0   1   SAB TAB Ectopic Multiple Live Births           1      Social History: Social History   Social History  . Marital status: Single    Spouse name: N/A  . Number of children: N/A  . Years of education: N/A   Social History Main Topics  . Smoking status: Former Games developer  . Smokeless tobacco: Never Used  . Alcohol use No  . Drug use: No  . Sexual activity: Yes    Birth control/ protection: None   Other Topics Concern  . None   Social History Narrative  . None    Family History: History reviewed. No pertinent family history.  Allergies: No Known Allergies  Prescriptions Prior to Admission  Medication Sig Dispense Refill Last Dose  . cyclobenzaprine (FLEXERIL) 10 MG tablet Take 0.5-1 tablets (5-10 mg total) by mouth every 8 (eight) hours as needed for muscle spasms. (Patient not taking: Reported on 12/04/2016) 20 tablet 0 Not Taking  . Prenatal Vit-Fe Fumarate-FA (PRENATAL MULTIVITAMIN) TABS tablet Take 1 tablet by mouth daily at 12 noon.   Taking     Review of Systems   All systems reviewed and negative except as stated in  HPI  Blood pressure 138/75, pulse 96, temperature (!) 97.5 F (36.4 C), temperature source Oral, resp. rate 20, last menstrual period 03/07/2016, SpO2 100 %, unknown if currently breastfeeding. General appearance: alert, cooperative, appears stated age and moderate distress Lungs: clear to auscultation bilaterally Heart: regular rate and rhythm Abdomen: soft, non-tender; bowel sounds normal Pelvic: deferred  Extremities: Homans sign is negative, no sign of DVT Presentation: cephalic Fetal monitoringBaseline: 125 bpm, Variability: Good {> 6 bpm), Accelerations: Reactive and Decelerations: Absent Uterine activityFrequency: Every 2-4 minutes, Duration: 50-60 seconds and Intensity: mild Dilation: 5 Effacement (%): 100 Station: -2 Exam by:: Camelia Eng RN   Prenatal labs: ABO, Rh: A/Positive/-- (05/14 0000) Antibody: Negative (05/14 0000) Rubella: Immune (05/14 0000) RPR: Reactive (09/10 1010)  HBsAg: Negative (05/14 0000)  HIV: Non-reactive (05/14 0000)  GBS: Positive (09/24 0000)  1 hr Glucola 143 Genetic screening  Negative quad screen Anatomy US normal, female   Prenatal Transfer Tool  Maternal Diabetes: No Genetic Screening: Normal Maternal Ultrasounds/Referrals: Normal Fetal Ultrasounds or other Referrals:  None Maternal Substance Abuse:  No Significant Maternal Medications:  none Significant Maternal Lab Results: Lab values include: Group B Strep positive  Results for orders placed or performed during the hospital encounter of 12/04/16 (from the past 24 hour(s))  POCT fern test   Collection Time: 12/04/16 10:59  AM  Result Value Ref Range   POCT Fern Test Negative = intact amniotic membranes     Patient Active Problem List   Diagnosis Date Noted  . History of poor fetal growth 08/04/2016  . Supervision of high risk pregnancy, antepartum 07/28/2016  . Suspected abnormality of fetus affecting management of mother in singleton pregnancy 07/28/2016  . Hashimoto's  thyroiditis 10/08/2012  . Obesity 10/08/2012  . Bipolar 1 disorder (HCC) 06/23/2011    Assessment/Plan:  Alyssa Hartman is a 21 y.o. G2P1001 at [redacted]w[redacted]d here for SOL  #Labor: plan for AROM and observation  #Pain: epidural #FWB: Category 1 #ID:  GBS positive #MOF: breast #MOC:undecided  #Circ:  n/a  Dyke Maes Cimolino, Student-PA  12/04/2016, 4:55 PM   I confirm that I have verified the information documented in the physician assistant student's note and that I have also personally reperformed the physical exam and all medical decision making activities.  Raelyn Mora, CNM 12/04/2016 7:47 PM

## 2016-12-04 NOTE — Progress Notes (Signed)
Pt just in MAU for pelvic pain, possible ROM

## 2016-12-04 NOTE — MAU Note (Signed)
Pt reports she has had abd back pain since earlier ths morning. Not as bad not. Stated she has gone to the BR several times and noticed a clear fluid leaking out.

## 2016-12-04 NOTE — MAU Note (Signed)
Pt reports contractions since this morning. PT denies LOF or vaginal bleeding. Reports good fetal movement. States she was 3cm earlier today.

## 2016-12-04 NOTE — Discharge Instructions (Signed)

## 2016-12-04 NOTE — Patient Instructions (Signed)
Vaginal Delivery Vaginal delivery means that you will give birth by pushing your baby out of your birth canal (vagina). A team of health care providers will help you before, during, and after vaginal delivery. Birth experiences are unique for every woman and every pregnancy, and birth experiences vary depending on where you choose to give birth. What should I do to prepare for my baby's birth? Before your baby is born, it is important to talk with your health care provider about:  Your labor and delivery preferences. These may include: ? Medicines that you may be given. ? How you will manage your pain. This might include non-medical pain relief techniques or injectable pain relief such as epidural analgesia. ? How you and your baby will be monitored during labor and delivery. ? Who may be in the labor and delivery room with you. ? Your feelings about surgical delivery of your baby (cesarean delivery, or C-section) if this becomes necessary. ? Your feelings about receiving donated blood through an IV tube (blood transfusion) if this becomes necessary.  Whether you are able: ? To take pictures or videos of the birth. ? To eat during labor and delivery. ? To move around, walk, or change positions during labor and delivery.  What to expect after your baby is born, such as: ? Whether delayed umbilical cord clamping and cutting is offered. ? Who will care for your baby right after birth. ? Medicines or tests that may be recommended for your baby. ? Whether breastfeeding is supported in your hospital or birth center. ? How long you will be in the hospital or birth center.  How any medical conditions you have may affect your baby or your labor and delivery experience.  To prepare for your baby's birth, you should also:  Attend all of your health care visits before delivery (prenatal visits) as recommended by your health care provider. This is important.  Prepare your home for your baby's  arrival. Make sure that you have: ? Diapers. ? Baby clothing. ? Feeding equipment. ? Safe sleeping arrangements for you and your baby.  Install a car seat in your vehicle. Have your car seat checked by a certified car seat installer to make sure that it is installed safely.  Think about who will help you with your new baby at home for at least the first several weeks after delivery.  What can I expect when I arrive at the birth center or hospital? Once you are in labor and have been admitted into the hospital or birth center, your health care provider may:  Review your pregnancy history and any concerns you have.  Insert an IV tube into one of your veins. This is used to give you fluids and medicines.  Check your blood pressure, pulse, temperature, and heart rate (vital signs).  Check whether your bag of water (amniotic sac) has broken (ruptured).  Talk with you about your birth plan and discuss pain control options.  Monitoring Your health care provider may monitor your contractions (uterine monitoring) and your baby's heart rate (fetal monitoring). You may need to be monitored:  Often, but not continuously (intermittently).  All the time or for long periods at a time (continuously). Continuous monitoring may be needed if: ? You are taking certain medicines, such as medicine to relieve pain or make your contractions stronger. ? You have pregnancy or labor complications.  Monitoring may be done by:  Placing a special stethoscope or a handheld monitoring device on your abdomen to   check your baby's heartbeat, and feeling your abdomen for contractions. This method of monitoring does not continuously record your baby's heartbeat or your contractions.  Placing monitors on your abdomen (external monitors) to record your baby's heartbeat and the frequency and length of contractions. You may not have to wear external monitors all the time.  Placing monitors inside of your uterus  (internal monitors) to record your baby's heartbeat and the frequency, length, and strength of your contractions. ? Your health care provider may use internal monitors if he or she needs more information about the strength of your contractions or your baby's heart rate. ? Internal monitors are put in place by passing a thin, flexible wire through your vagina and into your uterus. Depending on the type of monitor, it may remain in your uterus or on your baby's head until birth. ? Your health care provider will discuss the benefits and risks of internal monitoring with you and will ask for your permission before inserting the monitors.  Telemetry. This is a type of continuous monitoring that can be done with external or internal monitors. Instead of having to stay in bed, you are able to move around during telemetry. Ask your health care provider if telemetry is an option for you.  Physical exam Your health care provider may perform a physical exam. This may include:  Checking whether your baby is positioned: ? With the head toward your vagina (head-down). This is most common. ? With the head toward the top of your uterus (head-up or breech). If your baby is in a breech position, your health care provider may try to turn your baby to a head-down position so you can deliver vaginally. If it does not seem that your baby can be born vaginally, your provider may recommend surgery to deliver your baby. In rare cases, you may be able to deliver vaginally if your baby is head-up (breech delivery). ? Lying sideways (transverse). Babies that are lying sideways cannot be delivered vaginally.  Checking your cervix to determine: ? Whether it is thinning out (effacing). ? Whether it is opening up (dilating). ? How low your baby has moved into your birth canal.  What are the three stages of labor and delivery?  Normal labor and delivery is divided into the following three stages: Stage 1  Stage 1 is the  longest stage of labor, and it can last for hours or days. Stage 1 includes: ? Early labor. This is when contractions may be irregular, or regular and mild. Generally, early labor contractions are more than 10 minutes apart. ? Active labor. This is when contractions get longer, more regular, more frequent, and more intense. ? The transition phase. This is when contractions happen very close together, are very intense, and may last longer than during any other part of labor.  Contractions generally feel mild, infrequent, and irregular at first. They get stronger, more frequent (about every 2-3 minutes), and more regular as you progress from early labor through active labor and transition.  Many women progress through stage 1 naturally, but you may need help to continue making progress. If this happens, your health care provider may talk with you about: ? Rupturing your amniotic sac if it has not ruptured yet. ? Giving you medicine to help make your contractions stronger and more frequent.  Stage 1 ends when your cervix is completely dilated to 4 inches (10 cm) and completely effaced. This happens at the end of the transition phase. Stage 2  Once   your cervix is completely effaced and dilated to 4 inches (10 cm), you may start to feel an urge to push. It is common for the body to naturally take a rest before feeling the urge to push, especially if you received an epidural or certain other pain medicines. This rest period may last for up to 1-2 hours, depending on your unique labor experience.  During stage 2, contractions are generally less painful, because pushing helps relieve contraction pain. Instead of contraction pain, you may feel stretching and burning pain, especially when the widest part of your baby's head passes through the vaginal opening (crowning).  Your health care provider will closely monitor your pushing progress and your baby's progress through the vagina during stage 2.  Your  health care provider may massage the area of skin between your vaginal opening and anus (perineum) or apply warm compresses to your perineum. This helps it stretch as the baby's head starts to crown, which can help prevent perineal tearing. ? In some cases, an incision may be made in your perineum (episiotomy) to allow the baby to pass through the vaginal opening. An episiotomy helps to make the opening of the vagina larger to allow more room for the baby to fit through.  It is very important to breathe and focus so your health care provider can control the delivery of your baby's head. Your health care provider may have you decrease the intensity of your pushing, to help prevent perineal tearing.  After delivery of your baby's head, the shoulders and the rest of the body generally deliver very quickly and without difficulty.  Once your baby is delivered, the umbilical cord may be cut right away, or this may be delayed for 1-2 minutes, depending on your baby's health. This may vary among health care providers, hospitals, and birth centers.  If you and your baby are healthy enough, your baby may be placed on your chest or abdomen to help maintain the baby's temperature and to help you bond with each other. Some mothers and babies start breastfeeding at this time. Your health care team will dry your baby and help keep your baby warm during this time.  Your baby may need immediate care if he or she: ? Showed signs of distress during labor. ? Has a medical condition. ? Was born too early (prematurely). ? Had a bowel movement before birth (meconium). ? Shows signs of difficulty transitioning from being inside the uterus to being outside of the uterus. If you are planning to breastfeed, your health care team will help you begin a feeding. Stage 3  The third stage of labor starts immediately after the birth of your baby and ends after you deliver the placenta. The placenta is an organ that develops  during pregnancy to provide oxygen and nutrients to your baby in the womb.  Delivering the placenta may require some pushing, and you may have mild contractions. Breastfeeding can stimulate contractions to help you deliver the placenta.  After the placenta is delivered, your uterus should tighten (contract) and become firm. This helps to stop bleeding in your uterus. To help your uterus contract and to control bleeding, your health care provider may: ? Give you medicine by injection, through an IV tube, by mouth, or through your rectum (rectally). ? Massage your abdomen or perform a vaginal exam to remove any blood clots that are left in your uterus. ? Empty your bladder by placing a thin, flexible tube (catheter) into your bladder. ? Encourage   you to breastfeed your baby. After labor is over, you and your baby will be monitored closely to ensure that you are both healthy until you are ready to go home. Your health care team will teach you how to care for yourself and your baby. This information is not intended to replace advice given to you by your health care provider. Make sure you discuss any questions you have with your health care provider. Document Released: 11/20/2007 Document Revised: 08/31/2015 Document Reviewed: 02/25/2015 Elsevier Interactive Patient Education  2018 [employer]  

## 2016-12-05 LAB — RPR, QUANT+TP ABS (REFLEX)
Rapid Plasma Reagin, Quant: 1:2 {titer} — ABNORMAL HIGH
TREPONEMA PALLIDUM AB: NEGATIVE

## 2016-12-05 LAB — RPR: RPR Ser Ql: REACTIVE — AB

## 2016-12-05 NOTE — Progress Notes (Signed)
RN spoke with resident about pt's RPR status being positive and the rapid plasma reagin increased. Per resident, nothing needs done at this time for pt. Will continue to monitor.

## 2016-12-05 NOTE — Lactation Note (Signed)
This note was copied from a baby's chart. Lactation Consultation Note  Patient Name: Alyssa Hartman Date: 12/05/2016   Baby 24 hrs old.  Weight loss at 1.5% today. Output good. Mom states baby latched an hour ago and breastfed and swallowed for 10 mins.  Baby received supplement 3 hrs ago of 15 ml Alimentum, and is cueing to feed.   Recommended Mom supplement baby.  Offered to assist with pumping, but Mom declines.  Explained how double pumping will support a good milk supply, and baby can initially get her EBM by bottle.  Mom not sure if she is interested in putting baby to the breast.  Assisted with Alimentum supplementation.  Taught Mom how to pace bottle feed to simulate milk flow from breast.  Return demo done by Mom. Mom aware of WIC pump loaner and WIC pump program.  Mom states she wants a free pump from [facility], but not sure if she has $30 for a [facility] loaner on discharge.  LC to fax [facility] referral for pump.  Mom describes painful engorgement when her milk volume came in, as her first child would not latch to the breast.  Explained importance of regular double pumping to avoid engorgement.  Encouraged STS, and feeding baby at least every 3 hrs on cue.  Recommended she offer baby supplement of EBM+/formula by slow flow bottle using pace method.  Volume of 10-20 ml recommended.   Encouraged Mom to double pump at each feeding, goal of 8 pumpings in 24 hrs.  Mom to ask for help prn, Lactation to follow up in am.    [name] 12/05/2016, 6:37 PM

## 2016-12-05 NOTE — Clinical Social Work Maternal (Signed)
CLINICAL SOCIAL WORK MATERNAL/CHILD NOTE  Patient Details  Name: Alyssa Hartman MRN: [id number] Date of Birth: 09/13/1995  Date:  2016-03-30  Clinical Social Worker Initiating Note:  [name] Date/Time: Initiated:  12/05/16/1249     Child's Name:  Alyssa Hartman   Biological Parents:  Mother, Father (FOB is Alyssa Hartman 07/14/93)   Need for Interpreter:  None   Reason for Referral:  Behavioral Health Concerns (hx of bipolar disorder)   Address:  [address] [place] [address]    Phone number:  [phone] (home)     Additional phone number:   Household Members/Support Persons (HM/SP):   Household Member/Support Person 1 ([name] lives with [name] mother. )   HM/SP Name Relationship DOB or Age  HM/SP -1 Alyssa Hartman [name] son  01/18/2016 [name] son does not live in the home with [name].  HM/SP -2        HM/SP -3        HM/SP -4        HM/SP -5        HM/SP -6        HM/SP -7        HM/SP -8          Natural Supports (not living in the home):  Immediate Family, Other (Comment) (FOB's familt will also provide support. )   Professional Supports:     Employment: Unemployed   Type of Work:     Education:  Programmer, systems   Homebound arranged:    Museum/gallery curator Resources:  [employer]   Other Resources:  [employer], Physicist, medical    Cultural/Religious Considerations Which May Impact Care:  Per [employer] Sheet, [name] is [employer].   Strengths:  Ability to meet basic needs , Home prepared for child , Understanding of illness   Psychotropic Medications:         Pediatrician:       Pediatrician List:   [place]      Pediatrician Fax Number:    Risk Factors/Current Problems:  Mental Health Concerns , DHHS Involvement    Cognitive State:  Able to Concentrate , Alert , Insightful , Linear Thinking    Mood/Affect:  Relaxed  , Interested , Comfortable    CSW Assessment: CSW met with [name] to complete an assessment for MH hx.  When CSW arrived, [name] was in bed bonding with infant as evident by engaging in skin. [name] was polite and receptive to meeting with CSW.  CSW asked about [name] MH hx and [name] a disclosed a bipolar diagnosis.  [name] expressed that [name] discontinued the use of medication about 2 years ago, however, has been actively participating in outpatient counseling with therapist, Alyssa Hartman. [name] did not present with any acute symptoms and appeared to have insight and awareness about Hartman MH. CSW provided education regarding Baby Blues vs PMADs and provided [name] with information about support groups held at [facility] encouraged [name] to evaluate Hartman mental health throughout the postpartum period with the use of the New Mom Checklist developed by [facility] and notify a medical professional if symptoms arise.    CSW asked about [name] CPS involvement and [name] acknowledged that [name] currently has an open CPS case.  [name] that [name] oldest is currently residing with FOB and FOB's mother due to a  domestic dispute that occurred at [facility] home.  CSW made [name] aware that CSW will contact CPS to review that status of [name] case; [name] was understanding.  [name] reports having necessary items for infant and feeling prepared to parent.    CSW contacted, CPS worker, Alyssa Hartman, to inquire about the status of [name] CPS case.  CPS confirmed an open investigation and communicated to CSW that there is not a need for CSW to make a new report unless CSW has concerns regarding the safety of the infant; CSW does not have any safety concerns.     CSW identifies no further need for intervention at this time or barriers to discharge.  [name], MSW, LCSW Clinical Social Work [phone]  CSW Plan/Description:  No Further Intervention Required/No Barriers to Discharge, Perinatal Mood and Anxiety Disorder  (PMADs) Education, Other Patient/Family Education, Other Information/Referral to [employer], Sudden Infant Death Syndrome (SIDS) Education   [name], MSW, LCSW Clinical Social Work [phone]   Dimple Nanas, LCSW 12/05/2016, 1:11 PM

## 2016-12-05 NOTE — Progress Notes (Signed)
POSTPARTUM PROGRESS NOTE  Post Partum Day 1 Subjective:  Alyssa Hartman is a 21 y.o. [id number] [redacted]w[redacted]d s/p SVD.  No acute events overnight.  Pt denies problems with ambulating, voiding or po intake.  She denies nausea or vomiting.  Pain is well controlled.  She has had flatus. She has not had bowel movement.  Lochia Minimal.   Objective: Blood pressure (!) 116/58, pulse 97, temperature 98.4 F (36.9 C), temperature source Oral, resp. rate 16, height  (1.575 m), weight 112 kg (247 lb), last menstrual period 03/07/2016, SpO2 99 %, unknown if currently breastfeeding.  Physical Exam:  General: alert, cooperative and no distress Lochia:normal flow Chest: CTAB Heart: RRR no m/r/g Abdomen: +BS, soft, nontender,  Uterine Fundus: firm DVT Evaluation: No calf swelling or tenderness Extremities: no edema   Recent Labs  12/04/16 1704  HGB 13.1  HCT 39.0    Assessment/Plan:  ASSESSMENT: Alyssa Hartman is a 21 y.o. [id number] [redacted]w[redacted]d s/p SVD.  Plan for discharge tomorrow   LOS: 1 day   Renne Musca, MD PGY-2 Center for [facility], [facility]  12/05/2016, 9:17 AM

## 2016-12-05 NOTE — Lactation Note (Signed)
This note was copied from a baby's chart. Lactation Consultation Note Mom's 2nd baby. Her 1st baby is "almost 55 months old". Mom didn't BF him, but wishes to BF this baby. Baby girl is sleepy w/no interest in BF. LC attempted to assist in latching, but would hardly open mouth. W/gloved finger suck training for stimulation attempted. No suckling noted. Did note baby has a HIGH narrow palate.  Mom has "V" shaped breast w/everted nipples. Hand expression taught w/easily expressed 4ml colostrum. Taught mom spoon feeding. Baby took 1 ml w/ much stimulation. Mom encouraged to feed baby 8-12 times/24 hours and with feeding cues. Needs to wake baby every 2-3 hours to feed if hasn't cued to BF.  Mom wishes to mainly give colostrum if possible. Gave supplementing feeding sheet of amount needed at hours of age. Baby only needs 5-2ml of supplement at this time, could hand express 4ml, explained that was [facility] for now, but needs to increase w/age. Discussed newborn behavior, feeding habits of babies less than 6 lbs.,STS, I&O, cluster feeding, limit feeding time to 30 min. After colostrum given, if needed will give formula to equal amount needed for baby's nutrition until mom's volume increases for amount needed. Mom attentive, needed instructed several times, explained slowly and demonstration w/mom teach back for understanding.  Mom shown how to use DEBP & how to disassemble, clean, & reassemble parts. Mom knows to pump q3h for 15-20 min. Mom pumped for 10 minutes, then stopped to attempt to BF.  Instructed not to mix colostrum w/formula. LC didn't leave formula at this time d/t mom was hesitant to even supplement. Mom didn't understand about baby's needs for feedings a small baby. But states OK w/supplementing. LC not sure if mom will or not. Stressed importance of I&O and aggressive w/feedings, alert staff if baby not BF. RN notified of status.   Mom has plan to BF_STS Post pump, hand express Supplement  w/colostrum/formula according to amount needed Strict I&O. WH/LC brochure given w/resources, support groups and LC services. Mom has WIC.  Patient Name: Girl Katheen Aslin RUEAV'W Date: 12/05/2016 Reason for consult: Initial assessment   Maternal Data Has patient been taught Hand Expression?: Yes Does the patient have breastfeeding experience prior to this delivery?: No  Feeding Feeding Type: Breast Milk Length of feed: 0 min  LATCH Score Latch: Too sleepy or reluctant, no latch achieved, no sucking elicited.  Audible Swallowing: None  Type of Nipple: Everted at rest and after stimulation  Comfort (Breast/Nipple): Soft / non-tender  Hold (Positioning): Full assist, staff holds infant at breast  LATCH Score: 4  Interventions Interventions: Breast feeding basics reviewed;Breast compression;Assisted with latch;Adjust position;Skin to skin;Support pillows;DEBP;Breast massage;Position options;Hand express;Expressed milk  Lactation Tools Discussed/Used Tools: Pump Breast pump type: Double-Electric Breast Pump WIC Program: Yes Pump Review: Setup, frequency, and cleaning;Milk Storage Initiated by:: Peri Jefferson RN IBCLC Date initiated:: 12/05/16   Consult Status Consult Status: Follow-up Date: 12/05/16 Follow-up type: In-patient    Orene Abbasi, Diamond Nickel 12/05/2016, 5:10 AM

## 2016-12-06 DIAGNOSIS — R768 Other specified abnormal immunological findings in serum: Secondary | ICD-10-CM | POA: Diagnosis present

## 2016-12-06 MED ORDER — IBUPROFEN 600 MG PO TABS: 600.0000 mg | ORAL_TABLET | Freq: Four times a day (QID) | ORAL | 0 refills | Status: DC | PRN

## 2016-12-06 NOTE — Progress Notes (Signed)
Patient had pos RPR on admission and on 11-03-2016. Titer increased from 1:1 to 1:2; patient had neg RPR in 10/2016. Both times TPA was negative.   Patient denies s/s of syphilis; denies ever had dx or treatment for syphilis.   As TPA was negative, will assume false positive RPR. Patient advised to follow up in 6 weeks with PCP for repeat labs.

## 2016-12-06 NOTE — Progress Notes (Signed)
CSW met with [name] on 12/05/2016 and provided PPD education. [name] is aware of resources and supports.  [name] also has an outpatient therapist.  [name], MSW, [place] Work [phone]

## 2016-12-06 NOTE — Discharge Summary (Signed)
OB Discharge Summary     Patient Name: Alyssa Hartman DOB: 11/15/1995 MRN: [id number]  Date of admission: 12/04/2016 Delivering MD: Rudene Anda D   Date of discharge: 12/06/2016  Admitting diagnosis: 38wks CTX labor evaluation from clinic Intrauterine pregnancy: [redacted]w[redacted]d     Secondary diagnosis:  Active Problems:   Indication for care in labor or delivery  Additional problems: Hashimoto's thyroiditis; fam hx of brachiorenal syndrome (followed by MFM)     Discharge diagnosis: Term Pregnancy Delivered- precipitously- no GBS ppx                                                                                           Post partum procedures:none  Augmentation: AROM  Complications: None  Hospital course:  Onset of Labor With Vaginal Delivery     21 y.o. yo [id number] at [redacted]w[redacted]d was admitted in Active Labor on 12/04/2016. Patient had an uncomplicated labor course as follows:  Membrane Rupture Time/Date: 6:05 PM ,12/04/2016   Intrapartum Procedures: Episiotomy: None [1]                                         Lacerations:  Perineal [11]  Patient had a delivery of a Viable infant. 12/04/2016  Information for the patient's newborn:  Zaniya, Mcaulay [[id number]]  Delivery Method: Vaginal, Spontaneous Delivery (Filed from Delivery Summary)    Pateint had an uncomplicated postpartum course.  She is ambulating, tolerating a regular diet, passing flatus, and urinating well. Patient is discharged home in stable condition on 12/06/16.   Physical exam  Vitals:   12/05/16 0103 12/05/16 0900 12/05/16 1749 12/06/16 0618  BP: (!) 116/58 (!) 125/59 (!) 111/58 119/61  Pulse: 97 84 100 79  Resp: Temp: 98.4 F (36.9 C) 98.6 F (37 C) 98.7 F (37.1 C) 98.4 F (36.9 C)  TempSrc: Oral Oral Oral Oral  SpO2: 99%  100%   Weight:      Height:       General: alert and cooperative Lochia: appropriate Uterine Fundus: firm Incision: N/A DVT Evaluation: No evidence of DVT  seen on physical exam. Labs: Lab Results  Component Value Date   WBC 13.0 (H) 12/04/2016   HGB 13.1 12/04/2016   HCT 39.0 12/04/2016   MCV 78.6 12/04/2016   PLT 175 12/04/2016   No flowsheet data found.  Discharge instruction: per After Visit Summary and "Baby and Me Booklet".  After visit meds:  Allergies as of 12/06/2016   No Known Allergies     Medication List    STOP taking these medications   cyclobenzaprine 10 MG tablet Commonly known as:  FLEXERIL     TAKE these medications   ibuprofen 600 MG tablet Commonly known as:  ADVIL,MOTRIN Take 1 tablet (600 mg total) by mouth every 6 (six) hours as needed.   prenatal multivitamin Tabs tablet Take 1 tablet by mouth daily at 12 noon.       Diet: routine diet  Activity: Advance as tolerated. Pelvic rest for  6 weeks.   Outpatient follow up:4 weeks Follow up Appt:No future appointments. Follow up Visit:No Follow-up on file.  Postpartum contraception: Depo Provera  Newborn Data: Live born female  Birth Weight: 5 lb 4.5 oz (2396 g) APGAR: 8, 9  Newborn Delivery   Birth date/time:  12/04/2016 18:15:00 Delivery type:  Vaginal, Spontaneous Delivery      Baby Feeding: Breast Disposition:home with mother   12/06/2016 Cam Hai, CNM 7:43 AM

## 2016-12-06 NOTE — Lactation Note (Signed)
This note was copied from a baby's chart. Lactation Consultation Note  Patient Name: Alyssa Hartman GEXBM'W Date: 12/06/2016 Reason for consult: Follow-up assessment;Infant < 6lbs;Infant weight loss  Baby is 9 hours old  Per mom it is time to feed the baby , family member changed a wet and stool ( yellowish / brown diaper )  [place] assisted to latch the baby on the right breast / football / assisted with obtaining depth and breast compressions.  Baby latched with depth , and fed for 15 mins and softened the breast well, mom released suction and nipple well rounded.  Milk is in bilaterally with nodules and LC instructed mom on the use of hand pump prior to latching due to over fullness  With lateral nodules, ( pumped off .15 ml ) and then latched.  LC reviewed supply and demand and the importance of consistent softening of the breast to establish and protect the milk supply. LC offered mom a DEBP [place] loaner and per mom did not have the money available to do that and requested a hand pump. LC instructed mom on the use of hand pump. Mom was able to use it easily and pumped off milk to latch.  LC recommended if the baby is wide awake, if the 1st breast is over full to express off enough milk so the areola is compressible for the baby's mouth to fit over the nipple and on to the areola without cheek dimpling.  Latch with firm support and watch for baby's body signals for being satisfied.  If she is still awake after the 1st breast and rooting offer the 2nd breast, if awake and sluggish , supplement back with EBM in a Bottle PACE feeding ( LC reviewed 25-30 ml ) , and post pump the other breast ,  Follow this feeding pattern until the baby is gaining well over 6 pounds and can stay awake for a feeding.  Sore nipple and engorgement prevention and tx reviewed.  Mom will have a hand pump and the DEBP Kit for D/C and is aware WIC will loan her a DEBP if needed.  Mother informed of post-discharge support  and given phone number to the lactation department, including services for phone call assistance; out-patient appointments; and breastfeeding support group. List of other breastfeeding resources in the community given in the handout. Encouraged mother to call for problems or concerns related to breastfeeding.  [place] Nizhoni praised mom for how well she is doing and that her milk is already in.    Maternal Data Has patient been taught Hand Expression?: Yes  Feeding Feeding Type: Breast Fed Length of feed: 15 min  LATCH Score Latch: Grasps breast easily, tongue down, lips flanged, rhythmical sucking.  Audible Swallowing: Spontaneous and intermittent  Type of Nipple: Everted at rest and after stimulation  Comfort (Breast/Nipple): Filling, red/small blisters or bruises, mild/mod discomfort  Hold (Positioning): Assistance needed to correctly position infant at breast and maintain latch.  LATCH Score: 8  Interventions Interventions: Breast feeding basics reviewed;Assisted with latch;Skin to skin;Breast massage;Hand express;Pre-pump if needed;Breast compression;Adjust position;Support pillows;Position options;Expressed milk;Hand pump;DEBP  Lactation Tools Discussed/Used Tools: Pump Breast pump type: Double-Electric Breast Pump;Manual WIC Program: Yes Pump Review: Setup, frequency, and cleaning;Milk Storage Initiated by:: reviewed / MAI    Consult Status Consult Status: Complete Date: 12/06/16 Follow-up type: In-patient    [place] 12/06/2016, 11:13 AM

## 2016-12-06 NOTE — Discharge Instructions (Signed)

## 2017-01-13 ENCOUNTER — Ambulatory Visit: Payer: Medicaid Other | Admitting: Advanced Practice Midwife

## 2017-05-07 ENCOUNTER — Other Ambulatory Visit: Payer: Self-pay

## 2017-05-07 ENCOUNTER — Encounter (HOSPITAL_COMMUNITY): Payer: Self-pay | Admitting: Emergency Medicine

## 2017-05-07 DIAGNOSIS — R7989 Other specified abnormal findings of blood chemistry: Secondary | ICD-10-CM | POA: Insufficient documentation

## 2017-05-07 DIAGNOSIS — R5383 Other fatigue: Secondary | ICD-10-CM | POA: Diagnosis present

## 2017-05-07 DIAGNOSIS — Z87891 Personal history of nicotine dependence: Secondary | ICD-10-CM | POA: Insufficient documentation

## 2017-05-07 NOTE — ED Triage Notes (Signed)
Patient reports thyroid disease with irregular menstrual period , neck pain with mild swelling and nausea for several weeks .

## 2017-05-08 ENCOUNTER — Emergency Department (HOSPITAL_COMMUNITY)
Admission: EM | Admit: 2017-05-08 | Discharge: 2017-05-08 | Disposition: A | Discharge status: Home / Self Care | Payer: Medicaid Other | Attending: Emergency Medicine | Admitting: Emergency Medicine

## 2017-05-08 DIAGNOSIS — R7989 Other specified abnormal findings of blood chemistry: Secondary | ICD-10-CM

## 2017-05-08 LAB — COMPREHENSIVE METABOLIC PANEL
ALT: 29 U/L (ref 14–54)
ANION GAP: 10 (ref 5–15)
AST: 32 U/L (ref 15–41)
Albumin: 3.5 g/dL (ref 3.5–5.0)
Alkaline Phosphatase: 83 U/L (ref 38–126)
BILIRUBIN TOTAL: 0.9 mg/dL (ref 0.3–1.2)
BUN: 17 mg/dL (ref 6–20)
CO2: 24 mmol/L (ref 22–32)
Calcium: 8.9 mg/dL (ref 8.9–10.3)
Chloride: 107 mmol/L (ref 101–111)
Creatinine, Ser: 0.96 mg/dL (ref 0.44–1.00)
GFR calc Af Amer: >60 mL/min (ref >60)
Glucose, Bld: 116 mg/dL — ABNORMAL HIGH (ref 65–99)
Potassium: 4.4 mmol/L (ref 3.5–5.1)
Sodium: 141 mmol/L (ref 135–145)
TOTAL PROTEIN: 6.7 g/dL (ref 6.5–8.1)

## 2017-05-08 LAB — CBC WITH DIFFERENTIAL/PLATELET
BASOS PCT: 0 %
Basophils Absolute: 0 10*3/uL (ref 0.0–0.1)
EOS PCT: 2 %
Eosinophils Absolute: 0.2 10*3/uL (ref 0.0–0.7)
HEMATOCRIT: 38.2 % (ref 36.0–46.0)
Hemoglobin: 12.2 g/dL (ref 12.0–15.0)
Lymphocytes Relative: 36 %
Lymphs Abs: 2.8 10*3/uL (ref 0.7–4.0)
MCH: 25.6 pg — ABNORMAL LOW (ref 26.0–34.0)
MCHC: 31.9 g/dL (ref 30.0–36.0)
MCV: 80.1 fL (ref 78.0–100.0)
MONO ABS: 0.7 10*3/uL (ref 0.1–1.0)
MONOS PCT: 9 %
NEUTROS ABS: 4.1 10*3/uL (ref 1.7–7.7)
Neutrophils Relative %: 53 %
Platelets: 281 10*3/uL (ref 150–400)
RBC: 4.77 MIL/uL (ref 3.87–5.11)
RDW: 13.3 % (ref 11.5–15.5)
WBC: 7.7 10*3/uL (ref 4.0–10.5)

## 2017-05-08 LAB — URINALYSIS, ROUTINE W REFLEX MICROSCOPIC
Bilirubin Urine: NEGATIVE
Glucose, UA: NEGATIVE mg/dL
Hgb urine dipstick: NEGATIVE
Ketones, ur: NEGATIVE mg/dL
LEUKOCYTES UA: NEGATIVE
NITRITE: NEGATIVE
Protein, ur: NEGATIVE mg/dL
Specific Gravity, Urine: 1.024 (ref 1.005–1.030)
pH: 7 (ref 5.0–8.0)

## 2017-05-08 LAB — I-STAT BETA HCG BLOOD, ED (MC, WL, AP ONLY)

## 2017-05-08 LAB — TSH: TSH: 6.278 u[IU]/mL — AB (ref 0.350–4.500)

## 2017-05-08 LAB — T4, FREE: FREE T4: 0.78 ng/dL (ref 0.61–1.12)

## 2017-05-08 NOTE — ED Provider Notes (Signed)
[facility] EMERGENCY DEPARTMENT Provider Note   CSN: [id number] Arrival date & time: 05/07/17  2330     History   Chief Complaint Chief Complaint  Patient presents with  . Thyroid Problem    HPI Alyssa Hartman is a 22 y.o. female.  Patient presents to the emergency department with chief complaint of thyroid problem.  She reports having had thyroid problems in the past and was treated in her youth.  She reports that about 2 weeks ago she had some neck pain.  She denies any neck pain now.  She states that she has felt fatigued and has been having irregular periods.  She denies any fevers or chills.  She denies any other associated symptoms.     The history is provided by the patient. No language interpreter was used.    Past Medical History:  Diagnosis Date  . Hx of Hashimoto thyroiditis     Patient Active Problem List   Diagnosis Date Noted  . Biological false positive RPR test 12/06/2016  . Indication for care in labor or delivery 12/04/2016  . History of poor fetal growth 08/04/2016  . Supervision of high risk pregnancy, antepartum 07/28/2016  . Suspected abnormality of fetus affecting management of mother in singleton pregnancy 07/28/2016  . Hashimoto's thyroiditis 10/08/2012  . Obesity 10/08/2012  . Bipolar 1 disorder (HCC) 06/23/2011    Past Surgical History:  Procedure Laterality Date  . NO PAST SURGERIES      OB History    Gravida Para Term Preterm AB Living   2 2 2  0   2   SAB TAB Ectopic Multiple Live Births         0 2       Home Medications    Prior to Admission medications   Medication Sig Start Date End Date Taking? Authorizing Provider  ibuprofen (ADVIL,MOTRIN) 600 MG tablet Take 1 tablet (600 mg total) by mouth every 6 (six) hours as needed. 12/06/16   Arabella Merles, CNM  Prenatal Vit-Fe Fumarate-FA (PRENATAL MULTIVITAMIN) TABS tablet Take 1 tablet by mouth daily at 12 noon.    [provider]    Family  History No family history on file.  Social History Social History   Tobacco Use  . Smoking status: Former Games developer  . Smokeless tobacco: Never Used  Substance Use Topics  . Alcohol use: No  . Drug use: No     Allergies   Patient has no known allergies.   Review of Systems Review of Systems  All other systems reviewed and are negative.    Physical Exam Updated Vital Signs BP 110/71 (BP Location: Right Arm)   Pulse 79   Temp 98.2 F (36.8 C) (Oral)   Resp 16   SpO2 100%   Physical Exam  Constitutional: She is oriented to person, place, and time. She appears well-developed and well-nourished.  HENT:  Head: Normocephalic and atraumatic.  Eyes: Conjunctivae and EOM are normal. Pupils are equal, round, and reactive to light.  Neck: Normal range of motion. Neck supple. No thyromegaly present.  No palpable thyroid nodules or masses No erythema or tenderness  Cardiovascular: Normal rate and regular rhythm. Exam reveals no gallop and no friction rub.  No murmur heard. Pulmonary/Chest: Effort normal and breath sounds normal. No respiratory distress. She has no wheezes. She has no rales. She exhibits no tenderness.  Abdominal: Soft. Bowel sounds are normal. She exhibits no distension and no mass. There is no tenderness.  There is no rebound and no guarding.  Musculoskeletal: Normal range of motion. She exhibits no edema or tenderness.  Neurological: She is alert and oriented to person, place, and time.  Skin: Skin is warm and dry.  Psychiatric: She has a normal mood and affect. Her behavior is normal. Judgment and thought content normal.  Nursing note and vitals reviewed.    ED Treatments / Results  Labs (all labs ordered are listed, but only abnormal results are displayed) Labs Reviewed  CBC WITH DIFFERENTIAL/PLATELET - Abnormal; Notable for the following components:      Result Value   MCH 25.6 (*)    All other components within normal limits  COMPREHENSIVE METABOLIC  PANEL - Abnormal; Notable for the following components:   Glucose, Bld 116 (*)    All other components within normal limits  URINALYSIS, ROUTINE W REFLEX MICROSCOPIC - Abnormal; Notable for the following components:   APPearance HAZY (*)    All other components within normal limits  TSH - Abnormal; Notable for the following components:   TSH 6.278 (*)    All other components within normal limits  T4, FREE  I-STAT BETA HCG BLOOD, ED ([facility], [facility], [facility] ONLY)    EKG  EKG Interpretation None       Radiology No results found.  Procedures Procedures (including critical care time)  Medications Ordered in ED Medications - No data to display   Initial Impression / Assessment and Plan / ED Course  I have reviewed the triage vital signs and the nursing notes.  Pertinent labs & imaging results that were available during my care of the patient were reviewed by me and considered in my medical decision making (see chart for details).     Patient with history of thyroid disease, was treated in her youth, but has not required any treatment since.  She states that about 2 weeks ago she had some pain in her neck.  She reports having had some fatigue as well as irregular periods.  She denies having any pain in her neck now.  Her neck is nontender, there are no palpable masses or nodules.  TSH is mildly elevated, but T4 is normal.  Recommend primary care follow-up.  Patient discussed with Dr. Manus Gunningancour, who agrees with the plan.  Final Clinical Impressions(s) / ED Diagnoses   Final diagnoses:  Elevated TSH    ED Discharge Orders    None       Roxy HorsemanBrowning, Patrycja Mumpower, PA-C 05/08/17 [id number]    Glynn Octaveancour, Stephen, MD 05/08/17 [phone]

## 2017-05-08 NOTE — Discharge Instructions (Signed)
Your TSH was slightly elevated at 6.3. Your Free T4 was normal.  Please follow-up with your doctor.

## 2017-06-30 ENCOUNTER — Ambulatory Visit (INDEPENDENT_AMBULATORY_CARE_PROVIDER_SITE_OTHER): Payer: Medicaid Other

## 2017-06-30 DIAGNOSIS — Z3202 Encounter for pregnancy test, result negative: Secondary | ICD-10-CM | POA: Diagnosis present

## 2017-06-30 LAB — POCT PREGNANCY, URINE: Preg Test, Ur: NEGATIVE

## 2017-06-30 NOTE — Progress Notes (Addendum)
Pt here today for pregnancy test. Resulted negative.  Pt states that she has irregular periods due to thyroid issues.  I confirmed with pt if she has a PCP. Pt stated that she does not have one.  I gave pt information for [facility] and [facility] and informed her to give them a call so that she can have her thyroid managed. Pt stated understanding with no further questions.   I have reviewed this note and agree with the plan of care.  Nolene Bernheim, RN, MSN, NP-BC Nurse Practitioner, [facility] for [employer], [facility] 06/30/2017 5:39 PM

## 2017-08-10 ENCOUNTER — Encounter (HOSPITAL_COMMUNITY): Payer: Self-pay | Admitting: *Deleted

## 2017-08-10 ENCOUNTER — Inpatient Hospital Stay (HOSPITAL_COMMUNITY)
Admission: AD | Admit: 2017-08-10 | Discharge: 2017-08-10 | Discharge status: Against Medical Advice | Payer: Medicaid Other | Source: Ambulatory Visit | Attending: Obstetrics and Gynecology | Admitting: Obstetrics and Gynecology

## 2017-08-10 DIAGNOSIS — O26891 Other specified pregnancy related conditions, first trimester: Secondary | ICD-10-CM | POA: Diagnosis present

## 2017-08-10 DIAGNOSIS — Z3A01 Less than 8 weeks gestation of pregnancy: Secondary | ICD-10-CM | POA: Insufficient documentation

## 2017-08-10 DIAGNOSIS — Z5321 Procedure and treatment not carried out due to patient leaving prior to being seen by health care provider: Secondary | ICD-10-CM | POA: Diagnosis not present

## 2017-08-10 LAB — URINALYSIS, ROUTINE W REFLEX MICROSCOPIC
Bilirubin Urine: NEGATIVE
Glucose, UA: NEGATIVE mg/dL
Hgb urine dipstick: NEGATIVE
KETONES UR: 5 mg/dL — AB
LEUKOCYTES UA: NEGATIVE
Nitrite: NEGATIVE
PH: 7 (ref 5.0–8.0)
Protein, ur: NEGATIVE mg/dL
SPECIFIC GRAVITY, URINE: 1.026 (ref 1.005–1.030)

## 2017-08-10 LAB — POCT PREGNANCY, URINE: PREG TEST UR: POSITIVE — AB

## 2017-08-10 NOTE — MAU Note (Signed)
Pt reports she is [redacted] weeks pregnant, reports a grey foamy discharge for a week.

## 2017-08-10 NOTE — MAU Note (Signed)
Pt signed AMA paperwork stating she had to leave

## 2017-08-25 ENCOUNTER — Other Ambulatory Visit (HOSPITAL_COMMUNITY): Payer: Self-pay | Admitting: Nurse Practitioner

## 2017-08-25 DIAGNOSIS — Z3682 Encounter for antenatal screening for nuchal translucency: Secondary | ICD-10-CM

## 2017-08-25 DIAGNOSIS — Z3A13 13 weeks gestation of pregnancy: Secondary | ICD-10-CM

## 2017-09-14 ENCOUNTER — Encounter: Payer: Self-pay | Admitting: Obstetrics and Gynecology

## 2017-09-14 ENCOUNTER — Other Ambulatory Visit (HOSPITAL_COMMUNITY)
Admission: RE | Admit: 2017-09-14 | Discharge: 2017-09-14 | Disposition: A | Discharge status: Home / Self Care | Payer: Medicaid Other | Source: Ambulatory Visit | Attending: Obstetrics and Gynecology | Admitting: Obstetrics and Gynecology

## 2017-09-14 ENCOUNTER — Ambulatory Visit (INDEPENDENT_AMBULATORY_CARE_PROVIDER_SITE_OTHER): Payer: Medicaid Other | Admitting: Obstetrics and Gynecology

## 2017-09-14 ENCOUNTER — Encounter: Payer: Self-pay | Admitting: General Practice

## 2017-09-14 DIAGNOSIS — O0990 Supervision of high risk pregnancy, unspecified, unspecified trimester: Secondary | ICD-10-CM | POA: Insufficient documentation

## 2017-09-14 DIAGNOSIS — Z3491 Encounter for supervision of normal pregnancy, unspecified, first trimester: Secondary | ICD-10-CM | POA: Insufficient documentation

## 2017-09-14 DIAGNOSIS — E063 Autoimmune thyroiditis: Secondary | ICD-10-CM

## 2017-09-14 DIAGNOSIS — O0991 Supervision of high risk pregnancy, unspecified, first trimester: Secondary | ICD-10-CM

## 2017-09-14 DIAGNOSIS — Z3A00 Weeks of gestation of pregnancy not specified: Secondary | ICD-10-CM | POA: Diagnosis not present

## 2017-09-14 NOTE — Addendum Note (Signed)
Addended by: Frutoso ChaseOX, Tyris Eliot on: 09/14/2017 11:39 AM   Modules accepted: Orders

## 2017-09-14 NOTE — Patient Instructions (Signed)
First Trimester of Pregnancy The first trimester of pregnancy is from week 1 until the end of week 13 (months 1 through 3). A week after a sperm fertilizes an egg, the egg will implant on the wall of the uterus. This embryo will begin to develop into a baby. Genes from you and your partner will form the baby. The female genes will determine whether the baby will be a boy or a girl. At 6-8 weeks, the eyes and face will be formed, and the heartbeat can be seen on ultrasound. At the end of 12 weeks, all the baby's organs will be formed. Now that you are pregnant, you will want to do everything you can to have a healthy baby. Two of the most important things are to get good prenatal care and to follow your health care provider's instructions. Prenatal care is all the medical care you receive before the baby's birth. This care will help prevent, find, and treat any problems during the pregnancy and childbirth. Body changes during your first trimester Your body goes through many changes during pregnancy. The changes vary from woman to woman.  You may gain or lose a couple of pounds at first.  You may feel sick to your stomach (nauseous) and you may throw up (vomit). If the vomiting is uncontrollable, call your health care provider.  You may tire easily.  You may develop headaches that can be relieved by medicines. All medicines should be approved by your health care provider.  You may urinate more often. Painful urination may mean you have a bladder infection.  You may develop heartburn as a result of your pregnancy.  You may develop constipation because certain hormones are causing the muscles that push stool through your intestines to slow down.  You may develop hemorrhoids or swollen veins (varicose veins).  Your breasts may begin to grow larger and become tender. Your nipples may stick out more, and the tissue that surrounds them (areola) may become darker.  Your gums may bleed and may be  sensitive to brushing and flossing.  Dark spots or blotches (chloasma, mask of pregnancy) may develop on your face. This will likely fade after the baby is born.  Your menstrual periods will stop.  You may have a loss of appetite.  You may develop cravings for certain kinds of food.  You may have changes in your emotions from day to day, such as being excited to be pregnant or being concerned that something may go wrong with the pregnancy and baby.  You may have more vivid and strange dreams.  You may have changes in your hair. These can include thickening of your hair, rapid growth, and changes in texture. Some women also have hair loss during or after pregnancy, or hair that feels dry or thin. Your hair will most likely return to normal after your baby is born.  What to expect at prenatal visits During a routine prenatal visit:  You will be weighed to make sure you and the baby are growing normally.  Your blood pressure will be taken.  Your abdomen will be measured to track your baby's growth.  The fetal heartbeat will be listened to between weeks 10 and 14 of your pregnancy.  Test results from any previous visits will be discussed.  Your health care provider may ask you:  How you are feeling.  If you are feeling the baby move.  If you have had any abnormal symptoms, such as leaking fluid, bleeding, severe headaches,   or abdominal cramping.  If you are using any tobacco products, including cigarettes, chewing tobacco, and electronic cigarettes.  If you have any questions.  Other tests that may be performed during your first trimester include:  Blood tests to find your blood type and to check for the presence of any previous infections. The tests will also be used to check for low iron levels (anemia) and protein on red blood cells (Rh antibodies). Depending on your risk factors, or if you previously had diabetes during pregnancy, you may have tests to check for high blood  sugar that affects pregnant women (gestational diabetes).  Urine tests to check for infections, diabetes, or protein in the urine.  An ultrasound to confirm the proper growth and development of the baby.  Fetal screens for spinal cord problems (spina bifida) and Down syndrome.  HIV (human immunodeficiency virus) testing. Routine prenatal testing includes screening for HIV, unless you choose not to have this test.  You may need other tests to make sure you and the baby are doing well.  Follow these instructions at home: Medicines  Follow your health care provider's instructions regarding medicine use. Specific medicines may be either safe or unsafe to take during pregnancy.  Take a prenatal vitamin that contains at least 600 micrograms (mcg) of folic acid.  If you develop constipation, try taking a stool softener if your health care provider approves. Eating and drinking  Eat a balanced diet that includes fresh fruits and vegetables, whole grains, good sources of protein such as meat, eggs, or tofu, and low-fat dairy. Your health care provider will help you determine the amount of weight gain that is right for you.  Avoid raw meat and uncooked cheese. These carry germs that can cause birth defects in the baby.  Eating four or five small meals rather than three large meals a day may help relieve nausea and vomiting. If you start to feel nauseous, eating a few soda crackers can be helpful. Drinking liquids between meals, instead of during meals, also seems to help ease nausea and vomiting.  Limit foods that are high in fat and processed sugars, such as fried and sweet foods.  To prevent constipation: ? Eat foods that are high in fiber, such as fresh fruits and vegetables, whole grains, and beans. ? Drink enough fluid to keep your urine clear or pale yellow. Activity  Exercise only as directed by your health care provider. Most women can continue their usual exercise routine during  pregnancy. Try to exercise for 30 minutes at least 5 days a week. Exercising will help you: ? Control your weight. ? Stay in shape. ? Be prepared for labor and delivery.  Experiencing pain or cramping in the lower abdomen or lower back is a good sign that you should stop exercising. Check with your health care provider before continuing with normal exercises.  Try to avoid standing for long periods of time. Move your legs often if you must stand in one place for a long time.  Avoid heavy lifting.  Wear low-heeled shoes and practice good posture.  You may continue to have sex unless your health care provider tells you not to. Relieving pain and discomfort  Wear a good support bra to relieve breast tenderness.  Take warm sitz baths to soothe any pain or discomfort caused by hemorrhoids. Use hemorrhoid cream if your health care provider approves.  Rest with your legs elevated if you have leg cramps or low back pain.  If you develop   varicose veins in your legs, wear support hose. Elevate your feet for 15 minutes, 3-4 times a day. Limit salt in your diet. Prenatal care  Schedule your prenatal visits by the twelfth week of pregnancy. They are usually scheduled monthly at first, then more often in the last 2 months before delivery.  Write down your questions. Take them to your prenatal visits.  Keep all your prenatal visits as told by your health care provider. This is important. Safety  Wear your seat belt at all times when driving.  Make a list of emergency phone numbers, including numbers for family, friends, the hospital, and police and fire departments. General instructions  Ask your health care provider for a referral to a local prenatal education class. Begin classes no later than the beginning of month 6 of your pregnancy.  Ask for help if you have counseling or nutritional needs during pregnancy. Your health care provider can offer advice or refer you to specialists for help  with various needs.  Do not use hot tubs, steam rooms, or saunas.  Do not douche or use tampons or scented sanitary pads.  Do not cross your legs for long periods of time.  Avoid cat litter boxes and soil used by cats. These carry germs that can cause birth defects in the baby and possibly loss of the fetus by miscarriage or stillbirth.  Avoid all smoking, herbs, alcohol, and medicines not prescribed by your health care provider. Chemicals in these products affect the formation and growth of the baby.  Do not use any products that contain nicotine or tobacco, such as cigarettes and e-cigarettes. If you need help quitting, ask your health care provider. You may receive counseling support and other resources to help you quit.  Schedule a dentist appointment. At home, brush your teeth with a soft toothbrush and be gentle when you floss. Contact a health care provider if:  You have dizziness.  You have mild pelvic cramps, pelvic pressure, or nagging pain in the abdominal area.  You have persistent nausea, vomiting, or diarrhea.  You have a bad smelling vaginal discharge.  You have pain when you urinate.  You notice increased swelling in your face, hands, legs, or ankles.  You are exposed to fifth disease or chickenpox.  You are exposed to German measles (rubella) and have never had it. Get help right away if:  You have a fever.  You are leaking fluid from your vagina.  You have spotting or bleeding from your vagina.  You have severe abdominal cramping or pain.  You have rapid weight gain or loss.  You vomit blood or material that looks like coffee grounds.  You develop a severe headache.  You have shortness of breath.  You have any kind of trauma, such as from a fall or a car accident. Summary  The first trimester of pregnancy is from week 1 until the end of week 13 (months 1 through 3).  Your body goes through many changes during pregnancy. The changes vary from  woman to woman.  You will have routine prenatal visits. During those visits, your health care provider will examine you, discuss any test results you may have, and talk with you about how you are feeling. This information is not intended to replace advice given to you by your health care provider. Make sure you discuss any questions you have with your health care provider. Document Released: 02/04/2001 Document Revised: 01/23/2016 Document Reviewed: 01/23/2016 Elsevier Interactive Patient Education  2018 Elsevier   Inc.  

## 2017-09-14 NOTE — Progress Notes (Signed)
Last pap was at [facility] a couple weeks ago.

## 2017-09-14 NOTE — Progress Notes (Signed)
Subjective:  Terance Icenetra C Laski is a 22 y.o. G3P2002 at 5714w4d being seen today for first OB visit. ? LMP d/t Hashimoto thyroiditis. Currently on no meds. 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 Short interval pregnancy ( TSVD 10/18) H/O IUGR with last pregnancy.  She is currently monitored for the following issues for this high-risk pregnancy and has Supervision of high risk pregnancy, antepartum; Bipolar 1 disorder (HCC); Hashimoto's thyroiditis; Obesity; Biological false positive RPR test; and Supervision of normal pregnancy in first trimester on their problem list.  Patient reports no complaints.  Contractions: Irritability. Vag. Bleeding: None.  Movement: Present. Denies leaking of fluid.   The following portions of the patient's history were reviewed and updated as appropriate: allergies, current medications, past family history, past medical history, past social history, past surgical history and problem list. Problem list updated.  Objective:   Vitals:   09/14/17 1043  BP: 107/73  Pulse: 81  Weight: 246 lb 1.6 oz (111.6 kg)    Fetal Status: Fetal Heart Rate (bpm): 169   Movement: Present     General:  Alert, oriented and cooperative. Patient is in no acute distress.  Skin: Skin is warm and dry. No rash noted.   Cardiovascular: Normal heart rate noted  Respiratory: Normal respiratory effort, no problems with respiration noted  Abdomen: Soft, gravid, appropriate for gestational age. Pain/Pressure: Absent     Pelvic:  Cervical exam deferred        Extremities: Normal range of motion.  Edema: Trace  Mental Status: Normal mood and affect. Normal behavior. Normal judgment and thought content.   Urinalysis:      Assessment and Plan:  Pregnancy: G3P2002 at 5514w4d  1. Hashimoto's thyroiditis Thyroid panel today  2. Supervision of high risk pregnancy, antepartum Prenatal care and labs reviewed with pt. Will obtain dating scan. Referral to MFM/genetics d/t H/O BOR syndrome - Obstetric  Panel, Including HIV - Culture, OB Urine - Cystic Fibrosis Mutation 97 - Genetic Screening - Hemoglobinopathy evaluation - GC[facility]Chlamydia probe amp [facility]  Preterm labor symptoms and general obstetric precautions including but not limited to vaginal bleeding, contractions, leaking of fluid and fetal movement were reviewed in detail with the patient. Please refer to After Visit Summary for other counseling recommendations.  Return in about 1 month (around 10/12/2017) for OB visit.   Hermina StaggersErvin, Zyree Traynham L, MD

## 2017-09-15 LAB — THYROID PANEL WITH TSH
FREE THYROXINE INDEX: 1.4 (ref 1.2–4.9)
T3 UPTAKE RATIO: 22 % — AB (ref 24–39)
T4 TOTAL: 6.5 ug/dL (ref 4.5–12.0)
TSH: 3.21 u[IU]/mL (ref 0.450–4.500)

## 2017-09-15 LAB — URINE CYTOLOGY ANCILLARY ONLY
CHLAMYDIA, DNA PROBE: NEGATIVE
Neisseria Gonorrhea: NEGATIVE

## 2017-09-17 LAB — OBSTETRIC PANEL, INCLUDING HIV
ANTIBODY SCREEN: NEGATIVE
BASOS: 0 %
Basophils Absolute: 0 10*3/uL (ref 0.0–0.2)
EOS (ABSOLUTE): 0.1 10*3/uL (ref 0.0–0.4)
EOS: 1 %
HEMATOCRIT: 36.6 % (ref 34.0–46.6)
HIV Screen 4th Generation wRfx: NONREACTIVE
Hemoglobin: 12 g/dL (ref 11.1–15.9)
Hepatitis B Surface Ag: NEGATIVE
IMMATURE GRANS (ABS): 0 10*3/uL (ref 0.0–0.1)
Immature Granulocytes: 0 %
LYMPHS: 23 %
Lymphocytes Absolute: 1.7 10*3/uL (ref 0.7–3.1)
MCH: 25.6 pg — ABNORMAL LOW (ref 26.6–33.0)
MCHC: 32.8 g/dL (ref 31.5–35.7)
MCV: 78 fL — ABNORMAL LOW (ref 79–97)
MONOCYTES: 8 %
Monocytes Absolute: 0.6 10*3/uL (ref 0.1–0.9)
Neutrophils Absolute: 5.1 10*3/uL (ref 1.4–7.0)
Neutrophils: 68 %
Platelets: 212 10*3/uL (ref 150–450)
RBC: 4.68 x10E6/uL (ref 3.77–5.28)
RDW: 16.5 % — ABNORMAL HIGH (ref 12.3–15.4)
RH TYPE: POSITIVE
RPR Ser Ql: REACTIVE — AB
RUBELLA: 3.69 {index} (ref >0.99)
WBC: 7.5 10*3/uL (ref 3.4–10.8)

## 2017-09-17 LAB — HEMOGLOBINOPATHY EVALUATION
HEMOGLOBIN A2 QUANTITATION: 2.3 % (ref 1.8–3.2)
HGB C: 0 %
HGB S: 0 %
HGB VARIANT: 0 %
Hemoglobin F Quantitation: 0 % (ref 0.0–2.0)
Hgb A: 97.7 % (ref 96.4–98.8)

## 2017-09-17 LAB — HEMOGLOBIN A1C
Est. average glucose Bld gHb Est-mCnc: 117 mg/dL
Hgb A1c MFr Bld: 5.7 % — ABNORMAL HIGH (ref 4.8–5.6)

## 2017-09-17 LAB — RPR, QUANT+TP ABS (REFLEX): TREPONEMA PALLIDUM AB: NEGATIVE

## 2017-09-17 LAB — CULTURE, OB URINE

## 2017-09-17 LAB — URINE CULTURE, OB REFLEX

## 2017-09-18 LAB — CYSTIC FIBROSIS MUTATION 97: GENE DIS ANAL CARRIER INTERP BLD/T-IMP: NOT DETECTED

## 2017-09-22 ENCOUNTER — Telehealth: Payer: Self-pay

## 2017-09-22 NOTE — Telephone Encounter (Signed)
Returned call and advised of fetal sex

## 2017-09-24 ENCOUNTER — Encounter (HOSPITAL_COMMUNITY): Payer: Self-pay

## 2017-10-01 ENCOUNTER — Other Ambulatory Visit (HOSPITAL_COMMUNITY): Payer: Self-pay | Admitting: Nurse Practitioner

## 2017-10-01 ENCOUNTER — Ambulatory Visit (HOSPITAL_COMMUNITY): Admission: RE | Admit: 2017-10-01 | Payer: Medicaid Other | Source: Ambulatory Visit

## 2017-10-01 ENCOUNTER — Other Ambulatory Visit (HOSPITAL_COMMUNITY): Payer: Self-pay | Admitting: *Deleted

## 2017-10-01 ENCOUNTER — Ambulatory Visit (HOSPITAL_COMMUNITY)
Admission: RE | Admit: 2017-10-01 | Discharge: 2017-10-01 | Disposition: A | Discharge status: Home / Self Care | Payer: Medicaid Other | Source: Ambulatory Visit | Attending: Nurse Practitioner | Admitting: Nurse Practitioner

## 2017-10-01 ENCOUNTER — Encounter (HOSPITAL_COMMUNITY): Payer: Self-pay

## 2017-10-01 ENCOUNTER — Other Ambulatory Visit (HOSPITAL_COMMUNITY): Payer: Self-pay | Admitting: Obstetrics and Gynecology

## 2017-10-01 DIAGNOSIS — O09299 Supervision of pregnancy with other poor reproductive or obstetric history, unspecified trimester: Secondary | ICD-10-CM

## 2017-10-01 DIAGNOSIS — Z3A13 13 weeks gestation of pregnancy: Secondary | ICD-10-CM

## 2017-10-01 DIAGNOSIS — Z3682 Encounter for antenatal screening for nuchal translucency: Secondary | ICD-10-CM | POA: Diagnosis present

## 2017-10-01 DIAGNOSIS — Z3491 Encounter for supervision of normal pregnancy, unspecified, first trimester: Secondary | ICD-10-CM

## 2017-10-01 DIAGNOSIS — Z8489 Family history of other specified conditions: Secondary | ICD-10-CM

## 2017-10-01 DIAGNOSIS — O09292 Supervision of pregnancy with other poor reproductive or obstetric history, second trimester: Secondary | ICD-10-CM | POA: Diagnosis not present

## 2017-10-01 DIAGNOSIS — O352XX0 Maternal care for (suspected) hereditary disease in fetus, not applicable or unspecified: Secondary | ICD-10-CM | POA: Diagnosis not present

## 2017-10-01 DIAGNOSIS — E079 Disorder of thyroid, unspecified: Secondary | ICD-10-CM

## 2017-10-01 DIAGNOSIS — O99282 Endocrine, nutritional and metabolic diseases complicating pregnancy, second trimester: Principal | ICD-10-CM

## 2017-10-13 ENCOUNTER — Ambulatory Visit (INDEPENDENT_AMBULATORY_CARE_PROVIDER_SITE_OTHER): Payer: Medicaid Other | Admitting: Obstetrics and Gynecology

## 2017-10-13 ENCOUNTER — Encounter: Payer: Self-pay | Admitting: Obstetrics and Gynecology

## 2017-10-13 VITALS — BP 108/72 | HR 80 | Wt 248.0 lb

## 2017-10-13 DIAGNOSIS — O0992 Supervision of high risk pregnancy, unspecified, second trimester: Secondary | ICD-10-CM

## 2017-10-13 DIAGNOSIS — R768 Other specified abnormal immunological findings in serum: Secondary | ICD-10-CM

## 2017-10-13 DIAGNOSIS — E063 Autoimmune thyroiditis: Secondary | ICD-10-CM

## 2017-10-13 DIAGNOSIS — Z87898 Personal history of other specified conditions: Secondary | ICD-10-CM | POA: Insufficient documentation

## 2017-10-13 DIAGNOSIS — Z8759 Personal history of other complications of pregnancy, childbirth and the puerperium: Secondary | ICD-10-CM

## 2017-10-13 DIAGNOSIS — O0990 Supervision of high risk pregnancy, unspecified, unspecified trimester: Secondary | ICD-10-CM

## 2017-10-13 NOTE — Progress Notes (Signed)
Patient complaining of headache. Jennifer Howard RN  

## 2017-10-13 NOTE — Patient Instructions (Signed)

## 2017-10-13 NOTE — Progress Notes (Signed)
Subjective:  Alyssa Hartman is a 22 y.o. G3P2002 at 2982w5d being seen today for ongoing prenatal care.  She is currently monitored for the following issues for this high-risk pregnancy and has Supervision of high risk pregnancy, antepartum; Bipolar 1 disorder (HCC); Hashimoto's thyroiditis; Obesity; Biological false positive RPR test; History of genetic disease; and History of prior pregnancy with IUGR newborn on their problem list.  Patient reports no complaints.   . Vag. Bleeding: None.  Movement: Present. Denies leaking of fluid.   The following portions of the patient's history were reviewed and updated as appropriate: allergies, current medications, past family history, past medical history, past social history, past surgical history and problem list. Problem list updated.  Objective:   Vitals:   10/13/17 1333  BP: 108/72  Pulse: 80  Weight: 248 lb (112.5 kg)    Fetal Status:     Movement: Present     General:  Alert, oriented and cooperative. Patient is in no acute distress.  Skin: Skin is warm and dry. No rash noted.   Cardiovascular: Normal heart rate noted  Respiratory: Normal respiratory effort, no problems with respiration noted  Abdomen: Soft, gravid, appropriate for gestational age. Pain/Pressure: Absent     Pelvic:  Cervical exam deferred        Extremities: Normal range of motion.  Edema: None  Mental Status: Normal mood and affect. Normal behavior. Normal judgment and thought content.   Urinalysis:      Assessment and Plan:  Pregnancy: G3P2002 at 8882w5d  1. Supervision of high risk pregnancy, antepartum Stable  2. Hashimoto's thyroiditis Repeat thyroid function @ 20 weeks  3. Biological false positive RPR test Stable Last titer 1:2 Repeat @ 20 weeks  4. History of genetic disease H/O BOR syndrome. Genetic counseling in 2018, declined additional testing   5. History of prior pregnancy with IUGR newborn Anatomy scan next month, follow with growth scans  as indicated  Preterm labor symptoms and general obstetric precautions including but not limited to vaginal bleeding, contractions, leaking of fluid and fetal movement were reviewed in detail with the patient. Please refer to After Visit Summary for other counseling recommendations.  Return in about 4 weeks (around 11/10/2017) for OB visit.   Hermina StaggersErvin, Michael L, MD

## 2017-10-29 ENCOUNTER — Encounter (HOSPITAL_COMMUNITY): Payer: Self-pay

## 2017-10-29 ENCOUNTER — Inpatient Hospital Stay (HOSPITAL_COMMUNITY)
Admission: AD | Admit: 2017-10-29 | Discharge: 2017-10-29 | Disposition: A | Discharge status: Home / Self Care | Payer: Medicaid Other | Source: Ambulatory Visit | Attending: Obstetrics and Gynecology | Admitting: Obstetrics and Gynecology

## 2017-10-29 ENCOUNTER — Telehealth: Payer: Self-pay

## 2017-10-29 DIAGNOSIS — Z3492 Encounter for supervision of normal pregnancy, unspecified, second trimester: Secondary | ICD-10-CM

## 2017-10-29 DIAGNOSIS — Z3A17 17 weeks gestation of pregnancy: Secondary | ICD-10-CM | POA: Insufficient documentation

## 2017-10-29 DIAGNOSIS — G44209 Tension-type headache, unspecified, not intractable: Secondary | ICD-10-CM | POA: Insufficient documentation

## 2017-10-29 DIAGNOSIS — G44229 Chronic tension-type headache, not intractable: Secondary | ICD-10-CM | POA: Diagnosis not present

## 2017-10-29 DIAGNOSIS — Z87891 Personal history of nicotine dependence: Secondary | ICD-10-CM | POA: Diagnosis not present

## 2017-10-29 DIAGNOSIS — O99352 Diseases of the nervous system complicating pregnancy, second trimester: Secondary | ICD-10-CM | POA: Diagnosis not present

## 2017-10-29 DIAGNOSIS — O9989 Other specified diseases and conditions complicating pregnancy, childbirth and the puerperium: Secondary | ICD-10-CM

## 2017-10-29 DIAGNOSIS — R51 Headache: Secondary | ICD-10-CM | POA: Diagnosis present

## 2017-10-29 LAB — CBC
HCT: 33.9 % — ABNORMAL LOW (ref 36.0–46.0)
HEMOGLOBIN: 11.1 g/dL — AB (ref 12.0–15.0)
MCH: 25.6 pg — AB (ref 26.0–34.0)
MCHC: 32.7 g/dL (ref 30.0–36.0)
MCV: 78.3 fL (ref 78.0–100.0)
Platelets: 195 10*3/uL (ref 150–400)
RBC: 4.33 MIL/uL (ref 3.87–5.11)
RDW: 14.8 % (ref 11.5–15.5)
WBC: 9.1 10*3/uL (ref 4.0–10.5)

## 2017-10-29 LAB — URINALYSIS, ROUTINE W REFLEX MICROSCOPIC
Bilirubin Urine: NEGATIVE
GLUCOSE, UA: NEGATIVE mg/dL
HGB URINE DIPSTICK: NEGATIVE
KETONES UR: 20 mg/dL — AB
LEUKOCYTES UA: NEGATIVE
Nitrite: NEGATIVE
PH: 6 (ref 5.0–8.0)
Protein, ur: NEGATIVE mg/dL
Specific Gravity, Urine: 1.015 (ref 1.005–1.030)

## 2017-10-29 MED ORDER — ACETAMINOPHEN 500 MG PO TABS
1000.0000 mg | ORAL_TABLET | Freq: Once | ORAL | Status: AC
  Administered 2017-10-29: 1000 mg via ORAL
  Filled 2017-10-29: qty 2

## 2017-10-29 MED ORDER — PRENATAL MULTIVITAMIN CH: 1.0000 | ORAL_TABLET | Freq: Every day | ORAL | 30 refills | Status: DC

## 2017-10-29 NOTE — Discharge Instructions (Signed)
Second Trimester of Pregnancy The second trimester is from week 13 through week 28, month 4 through 6. This is often the time in pregnancy that you feel your best. Often times, morning sickness has lessened or quit. You may have more energy, and you may get hungry more often. Your unborn baby (fetus) is growing rapidly. At the end of the sixth month, he or she is about 9 inches long and weighs about 1 pounds. You will likely feel the baby move (quickening) between 18 and 20 weeks of pregnancy. Follow these instructions at home:  Avoid all smoking, herbs, and alcohol. Avoid drugs not approved by your doctor.  Do not use any tobacco products, including cigarettes, chewing tobacco, and electronic cigarettes. If you need help quitting, ask your doctor. You may get counseling or other support to help you quit.  Only take medicine as told by your doctor. Some medicines are safe and some are not during pregnancy.  Exercise only as told by your doctor. Stop exercising if you start having cramps.  Eat regular, healthy meals.  Wear a good support bra if your breasts are tender.  Do not use hot tubs, steam rooms, or saunas.  Wear your seat belt when driving.  Avoid raw meat, uncooked cheese, and liter boxes and soil used by cats.  Take your prenatal vitamins.  Take 1500-2000 milligrams of calcium daily starting at the 20th week of pregnancy until you deliver your baby.  Try taking medicine that helps you poop (stool softener) as needed, and if your doctor approves. Eat more fiber by eating fresh fruit, vegetables, and whole grains. Drink enough fluids to keep your pee (urine) clear or pale yellow.  Take warm water baths (sitz baths) to soothe pain or discomfort caused by hemorrhoids. Use hemorrhoid cream if your doctor approves.  If you have puffy, bulging veins (varicose veins), wear support hose. Raise (elevate) your feet for 15 minutes, 3-4 times a day. Limit salt in your diet.  Avoid heavy  lifting, wear low heals, and sit up straight.  Rest with your legs raised if you have leg cramps or low back pain.  Visit your dentist if you have not gone during your pregnancy. Use a soft toothbrush to brush your teeth. Be gentle when you floss.  You can have sex (intercourse) unless your doctor tells you not to.  Go to your doctor visits. Get help if:  You feel dizzy.  You have mild cramps or pressure in your lower belly (abdomen).  You have a nagging pain in your belly area.  You continue to feel sick to your stomach (nauseous), throw up (vomit), or have watery poop (diarrhea).  You have bad smelling fluid coming from your vagina.  You have pain with peeing (urination). Get help right away if:  You have a fever.  You are leaking fluid from your vagina.  You have spotting or bleeding from your vagina.  You have severe belly cramping or pain.  You lose or gain weight rapidly.  You have trouble catching your breath and have chest pain.  You notice sudden or extreme puffiness (swelling) of your face, hands, ankles, feet, or legs.  You have not felt the baby move in over an hour.  You have severe headaches that do not go away with medicine.  You have vision changes. This information is not intended to replace advice given to you by your health care provider. Make sure you discuss any questions you have with your health care   provider. Document Released: 05/07/2009 Document Revised: 07/19/2015 Document Reviewed: 04/13/2012 Elsevier Interactive Patient Education  2017 Elsevier Inc.  

## 2017-10-29 NOTE — Telephone Encounter (Signed)
Returned call and pt states that she has been having a pounding headache, ringing in ears, dizziness, and blurred vision, advised pt to go to [facility] as soon as possible to be evaluated. Pt states that her mom does not get off work until 7, advised pt to not wait that long, call ambulance if needed.

## 2017-10-29 NOTE — MAU Note (Addendum)
Pt states she has been having headaches and dizziness, headaches have been going on since the beginning of pregnancy but worsened today. Also is seeing spots in her vision and having ringing in her ears. Has eaten and drank something.

## 2017-10-29 NOTE — MAU Provider Note (Signed)
History     CSN: [id number]  Arrival date and time: 10/29/17 1627   First Provider Initiated Contact with Patient 10/29/17 1707      Chief Complaint  Patient presents with  . Headache  . Dizziness   HPI  Alyssa Hartman is a 22 y.o. G3P2002 at [redacted]w[redacted]d who presents to MAU with chief complaint of headache. This is a recurring problem she has experienced throughout this pregnancy. Reports pain at left temple, 8/10, does not radiate. No aggravating or alleviating factors. Reports she has been afraid to take Tylenol for fear it would harm her baby. Denies vaginal bleeding, leaking of fluid, fever, falls, or recent illness.    Patient states she has not eaten since early this morning (stated at 1700 hrs). States she has had 1-2 cups of water today.  OB History    Gravida  3   Para  2   Term  2   Preterm  0   AB      Living  2     SAB      TAB      Ectopic      Multiple  0   Live Births  2           Past Medical History:  Diagnosis Date  . Hx of Hashimoto thyroiditis   . Hypothyroid     Past Surgical History:  Procedure Laterality Date  . NO PAST SURGERIES      Family History  Problem Relation Age of Onset  . Diabetes Maternal Grandmother   . Hypertension Maternal Grandmother     Social History   Tobacco Use  . Smoking status: Former Games developer  . Smokeless tobacco: Never Used  Substance Use Topics  . Alcohol use: No  . Drug use: No    Allergies: No Known Allergies  Medications Prior to Admission  Medication Sig Dispense Refill Last Dose  . Prenatal Vit-Fe Fumarate-FA (PRENATAL MULTIVITAMIN) TABS tablet Take 1 tablet by mouth daily at 12 noon.   Taking    Review of Systems  Neurological: Positive for headaches.  All other systems reviewed and are negative.  Physical Exam   Blood pressure 134/60, pulse (!) 105, temperature 98.5 F (36.9 C), temperature source Oral, resp. rate 16, weight 114.3 kg, last menstrual period 07/02/2017, unknown if  currently breastfeeding.  Physical Exam  Nursing note and vitals reviewed. Constitutional: She is oriented to person, place, and time. She appears well-developed and well-nourished.  Cardiovascular: Normal rate, regular rhythm and intact distal pulses.  Respiratory: Effort normal. No respiratory distress.  GI:  Gravid  Neurological: She is alert and oriented to person, place, and time. She has normal reflexes. She displays normal reflexes. No cranial nerve deficit or sensory deficit. She exhibits normal muscle tone. Coordination and gait normal.  Reflex Scores:      Brachioradialis reflexes are 2+ on the right side and 2+ on the left side. Skin: Skin is warm and dry.  Psychiatric: She has a normal mood and affect. Her behavior is normal. Judgment and thought content normal.    MAU Course  Procedures  MDM --Denies pain after administration of Tylenol in MAU  Patient Vitals for the past 24 hrs:  BP Temp Temp src Pulse Resp Weight  10/29/17 1652 134/60 98.5 F (36.9 C) Oral (!) 105 16 -  10/29/17 1646 - - - - - 114.3 kg    Orders Placed This Encounter  Procedures  . Urinalysis, Routine w reflex microscopic  .  CBC   Results for orders placed or performed during the hospital encounter of 10/29/17 (from the past 24 hour(s))  CBC     Status: Abnormal   Collection Time: 10/29/17  5:04 PM  Result Value Ref Range   WBC 9.1 4.0 - 10.5 K/uL   RBC 4.33 3.87 - 5.11 MIL/uL   Hemoglobin 11.1 (L) 12.0 - 15.0 g/dL   HCT 29.9 (L) 37.1 - 69.6 %   MCV 78.3 78.0 - 100.0 fL   MCH 25.6 (L) 26.0 - 34.0 pg   MCHC 32.7 30.0 - 36.0 g/dL   RDW 78.9 38.1 - 01.7 %   Platelets 195 150 - 400 K/uL  Urinalysis, Routine w reflex microscopic     Status: Abnormal   Collection Time: 10/29/17  5:10 PM  Result Value Ref Range   Color, Urine YELLOW YELLOW   APPearance CLEAR CLEAR   Specific Gravity, Urine 1.015 1.005 - 1.030   pH 6.0 5.0 - 8.0   Glucose, UA NEGATIVE NEGATIVE mg/dL   Hgb urine dipstick  NEGATIVE NEGATIVE   Bilirubin Urine NEGATIVE NEGATIVE   Ketones, ur 20 (A) NEGATIVE mg/dL   Protein, ur NEGATIVE NEGATIVE mg/dL   Nitrite NEGATIVE NEGATIVE   Leukocytes, UA NEGATIVE NEGATIVE    Assessment and Plan  --22 y.o. [id number] at [redacted]w[redacted]d  --Tension headache relieved by PO Tylenol in MAU --Encourage PO hydration and small frequent snacks to minimize headache related to hunger --Discharge home stable condition, given safe medications handout  Calvert Cantor, CNM 10/29/2017, 6:36 PM

## 2017-11-10 ENCOUNTER — Ambulatory Visit (INDEPENDENT_AMBULATORY_CARE_PROVIDER_SITE_OTHER): Payer: Medicaid Other | Admitting: Obstetrics and Gynecology

## 2017-11-10 ENCOUNTER — Encounter: Payer: Self-pay | Admitting: Obstetrics and Gynecology

## 2017-11-10 VITALS — BP 114/80 | HR 112 | Wt 251.8 lb

## 2017-11-10 DIAGNOSIS — O0990 Supervision of high risk pregnancy, unspecified, unspecified trimester: Secondary | ICD-10-CM

## 2017-11-10 DIAGNOSIS — Z87898 Personal history of other specified conditions: Secondary | ICD-10-CM

## 2017-11-10 DIAGNOSIS — E063 Autoimmune thyroiditis: Secondary | ICD-10-CM

## 2017-11-10 DIAGNOSIS — R768 Other specified abnormal immunological findings in serum: Secondary | ICD-10-CM

## 2017-11-10 NOTE — Progress Notes (Signed)
Subjective:  Terance Icenetra C Sigl is a 22 y.o. G3P2002 at 4323w5d being seen today for ongoing prenatal care.  She is currently monitored for the following issues for this high-risk pregnancy and has Supervision of high risk pregnancy, antepartum; Bipolar 1 disorder (HCC); Hashimoto's thyroiditis; Obesity; Biological false positive RPR test; History of genetic disease; and History of prior pregnancy with IUGR newborn on their problem list.  Patient reports URI Sx.  Contractions: Not present. Vag. Bleeding: None.  Movement: Present. Denies leaking of fluid.   The following portions of the patient's history were reviewed and updated as appropriate: allergies, current medications, past family history, past medical history, past social history, past surgical history and problem list. Problem list updated.  Objective:   Vitals:   11/10/17 1054  BP: 114/80  Pulse: (!) 112  Weight: 251 lb 12.8 oz (114.2 kg)    Fetal Status: Fetal Heart Rate (bpm): 160   Movement: Present     General:  Alert, oriented and cooperative. Patient is in no acute distress.  Skin: Skin is warm and dry. No rash noted.   Cardiovascular: Normal heart rate noted  Respiratory: Normal respiratory effort, no problems with respiration noted  Abdomen: Soft, gravid, appropriate for gestational age. Pain/Pressure: Absent     Pelvic:  Cervical exam deferred        Extremities: Normal range of motion.  Edema: Trace  Mental Status: Normal mood and affect. Normal behavior. Normal judgment and thought content.   Urinalysis:      Assessment and Plan:  Pregnancy: G3P2002 at 7823w5d  1. Supervision of high risk pregnancy, antepartum Stable Medications for URI Sx reviewed F/U U/S next week - AFP, Serum, Open Spina Bifida  2. Biological false positive RPR test  - RPR  3. Hashimoto's thyroiditis  - Thyroid Panel With TSH  4. History of genetic disease S/P genetic counseling and F/U U/S next week  Preterm labor symptoms and  general obstetric precautions including but not limited to vaginal bleeding, contractions, leaking of fluid and fetal movement were reviewed in detail with the patient. Please refer to After Visit Summary for other counseling recommendations.  Return in about 4 weeks (around 12/08/2017) for OB visit.   Hermina StaggersErvin, Matthew Pais L, MD

## 2017-11-10 NOTE — Patient Instructions (Signed)

## 2017-11-10 NOTE — Progress Notes (Signed)
Patient reports fetal movement, denies pain. 

## 2017-11-12 ENCOUNTER — Encounter (HOSPITAL_COMMUNITY): Payer: Self-pay

## 2017-11-12 LAB — THYROID PANEL WITH TSH
FREE THYROXINE INDEX: 1.3 (ref 1.2–4.9)
T3 UPTAKE RATIO: 15 % — AB (ref 24–39)
T4, Total: 8.6 ug/dL (ref 4.5–12.0)
TSH: 3.83 u[IU]/mL (ref 0.450–4.500)

## 2017-11-12 LAB — RPR: RPR Ser Ql: REACTIVE — AB

## 2017-11-12 LAB — RPR, QUANT+TP ABS (REFLEX): T Pallidum Abs: NEGATIVE

## 2017-11-12 LAB — AFP, SERUM, OPEN SPINA BIFIDA
AFP MoM: 0.71
AFP Value: 26.8 ng/mL
GEST. AGE ON COLLECTION DATE: 18.7 wk
MATERNAL AGE AT EDD: 22.6 a
OSBR Risk 1 IN: 10000
TEST RESULTS AFP: NEGATIVE
Weight: 251 [lb_av]

## 2017-11-19 ENCOUNTER — Encounter (HOSPITAL_COMMUNITY): Payer: Self-pay

## 2017-11-19 ENCOUNTER — Ambulatory Visit (HOSPITAL_COMMUNITY)
Admission: RE | Admit: 2017-11-19 | Discharge: 2017-11-19 | Disposition: A | Discharge status: Home / Self Care | Payer: Medicaid Other | Source: Ambulatory Visit | Attending: Nurse Practitioner | Admitting: Nurse Practitioner

## 2017-11-19 DIAGNOSIS — O09292 Supervision of pregnancy with other poor reproductive or obstetric history, second trimester: Secondary | ICD-10-CM | POA: Insufficient documentation

## 2017-11-19 DIAGNOSIS — O99282 Endocrine, nutritional and metabolic diseases complicating pregnancy, second trimester: Secondary | ICD-10-CM | POA: Diagnosis not present

## 2017-11-19 DIAGNOSIS — E079 Disorder of thyroid, unspecified: Secondary | ICD-10-CM

## 2017-11-19 DIAGNOSIS — Z3A20 20 weeks gestation of pregnancy: Secondary | ICD-10-CM | POA: Diagnosis not present

## 2017-11-19 DIAGNOSIS — O352XX0 Maternal care for (suspected) hereditary disease in fetus, not applicable or unspecified: Secondary | ICD-10-CM

## 2017-11-19 DIAGNOSIS — O2692 Pregnancy related conditions, unspecified, second trimester: Secondary | ICD-10-CM | POA: Diagnosis not present

## 2017-11-19 DIAGNOSIS — Z87798 Personal history of other (corrected) congenital malformations: Secondary | ICD-10-CM | POA: Diagnosis not present

## 2017-11-19 DIAGNOSIS — Z362 Encounter for other antenatal screening follow-up: Secondary | ICD-10-CM | POA: Diagnosis not present

## 2017-11-20 ENCOUNTER — Other Ambulatory Visit (HOSPITAL_COMMUNITY): Payer: Self-pay | Admitting: *Deleted

## 2017-11-20 DIAGNOSIS — O09292 Supervision of pregnancy with other poor reproductive or obstetric history, second trimester: Secondary | ICD-10-CM

## 2017-12-08 ENCOUNTER — Ambulatory Visit (INDEPENDENT_AMBULATORY_CARE_PROVIDER_SITE_OTHER): Payer: Medicaid Other | Admitting: Obstetrics and Gynecology

## 2017-12-08 ENCOUNTER — Encounter: Payer: Self-pay | Admitting: Obstetrics and Gynecology

## 2017-12-08 VITALS — BP 120/84 | HR 93 | Wt 258.0 lb

## 2017-12-08 DIAGNOSIS — O0990 Supervision of high risk pregnancy, unspecified, unspecified trimester: Secondary | ICD-10-CM

## 2017-12-08 DIAGNOSIS — R768 Other specified abnormal immunological findings in serum: Secondary | ICD-10-CM

## 2017-12-08 DIAGNOSIS — E063 Autoimmune thyroiditis: Secondary | ICD-10-CM

## 2017-12-08 DIAGNOSIS — Z8759 Personal history of other complications of pregnancy, childbirth and the puerperium: Secondary | ICD-10-CM

## 2017-12-08 DIAGNOSIS — Z87898 Personal history of other specified conditions: Secondary | ICD-10-CM

## 2017-12-08 DIAGNOSIS — Z3009 Encounter for other general counseling and advice on contraception: Secondary | ICD-10-CM

## 2017-12-08 NOTE — Progress Notes (Signed)
   PRENATAL VISIT NOTE  Subjective:  Alyssa Hartman is a 22 y.o. G3P2002 at [redacted]w[redacted]d being seen today for ongoing prenatal care.  She is currently monitored for the following issues for this high-risk pregnancy and has Supervision of high risk pregnancy, antepartum; Bipolar 1 disorder (HCC); Hashimoto's thyroiditis; Obesity; Biological false positive RPR test; History of genetic disease; History of prior pregnancy with IUGR newborn; and Unwanted fertility on their problem list.  Patient reports no complaints.  Contractions: Not present. Vag. Bleeding: None.  Movement: Present. Denies leaking of fluid.   The following portions of the patient's history were reviewed and updated as appropriate: allergies, current medications, past family history, past medical history, past social history, past surgical history and problem list. Problem list updated.  Objective:   Vitals:   12/08/17 1313  BP: 120/84  Pulse: 93  Weight: 258 lb (117 kg)    Fetal Status: Fetal Heart Rate (bpm): 140   Movement: Present     General:  Alert, oriented and cooperative. Patient is in no acute distress.  Skin: Skin is warm and dry. No rash noted.   Cardiovascular: Normal heart rate noted  Respiratory: Normal respiratory effort, no problems with respiration noted  Abdomen: Soft, gravid, appropriate for gestational age.  Pain/Pressure: Absent     Pelvic: Cervical exam deferred        Extremities: Normal range of motion.     Mental Status: Normal mood and affect. Normal behavior. Normal judgment and thought content.   Assessment and Plan:  Pregnancy: G3P2002 at [redacted]w[redacted]d  1. Supervision of high risk pregnancy, antepartum  2. Hashimoto's thyroiditis Last TSH 10/2017, wnl  3. Biological false positive RPR test Last titer 1:2 10/2017 repeat with 3rd trim labs  4. History of prior pregnancy with IUGR newborn  5. History of genetic disease S/p counseling  6. Unwanted fertility  Desires BTL, counseled regarding  BTL and she strongly desires Will have sign papers later in pregnancy in case she has interval BTL for BMI   Preterm labor symptoms and general obstetric precautions including but not limited to vaginal bleeding, contractions, leaking of fluid and fetal movement were reviewed in detail with the patient. Please refer to After Visit Summary for other counseling recommendations.  Return in about 4 weeks (around 01/05/2018) for OB visit (MD).  Future Appointments  Date Time Provider Department Center  12/17/2017  1:00 PM [facility] [place] 3 [facility]    Conan Bowens, MD

## 2017-12-17 ENCOUNTER — Other Ambulatory Visit (HOSPITAL_COMMUNITY): Payer: Self-pay | Admitting: *Deleted

## 2017-12-17 ENCOUNTER — Ambulatory Visit (HOSPITAL_COMMUNITY)
Admission: RE | Admit: 2017-12-17 | Discharge: 2017-12-17 | Disposition: A | Discharge status: Home / Self Care | Payer: Medicaid Other | Source: Ambulatory Visit | Attending: Nurse Practitioner | Admitting: Nurse Practitioner

## 2017-12-17 ENCOUNTER — Encounter (HOSPITAL_COMMUNITY): Payer: Self-pay

## 2017-12-17 DIAGNOSIS — O2692 Pregnancy related conditions, unspecified, second trimester: Secondary | ICD-10-CM | POA: Diagnosis not present

## 2017-12-17 DIAGNOSIS — Z362 Encounter for other antenatal screening follow-up: Secondary | ICD-10-CM

## 2017-12-17 DIAGNOSIS — O322XX0 Maternal care for transverse and oblique lie, not applicable or unspecified: Secondary | ICD-10-CM | POA: Diagnosis not present

## 2017-12-17 DIAGNOSIS — Z87798 Personal history of other (corrected) congenital malformations: Secondary | ICD-10-CM | POA: Insufficient documentation

## 2017-12-17 DIAGNOSIS — O352XX0 Maternal care for (suspected) hereditary disease in fetus, not applicable or unspecified: Secondary | ICD-10-CM | POA: Diagnosis present

## 2017-12-17 DIAGNOSIS — Z3A24 24 weeks gestation of pregnancy: Secondary | ICD-10-CM | POA: Diagnosis not present

## 2017-12-17 DIAGNOSIS — Z3009 Encounter for other general counseling and advice on contraception: Secondary | ICD-10-CM

## 2017-12-17 DIAGNOSIS — O09292 Supervision of pregnancy with other poor reproductive or obstetric history, second trimester: Secondary | ICD-10-CM | POA: Diagnosis not present

## 2018-01-05 ENCOUNTER — Encounter: Payer: Medicaid Other | Admitting: Obstetrics and Gynecology

## 2018-01-13 ENCOUNTER — Ambulatory Visit (INDEPENDENT_AMBULATORY_CARE_PROVIDER_SITE_OTHER): Payer: Medicaid Other | Admitting: Obstetrics & Gynecology

## 2018-01-13 VITALS — BP 103/67 | HR 97 | Wt 262.4 lb

## 2018-01-13 DIAGNOSIS — Z6841 Body Mass Index (BMI) 40.0 and over, adult: Secondary | ICD-10-CM

## 2018-01-13 DIAGNOSIS — Z3481 Encounter for supervision of other normal pregnancy, first trimester: Secondary | ICD-10-CM

## 2018-01-13 NOTE — Progress Notes (Signed)
   PRENATAL VISIT NOTE  Subjective:  Alyssa Hartman is a 22 y.o. G3P2002 at 5568w6d being seen today for ongoing prenatal care.  She is currently monitored for the following issues for this high-risk pregnancy and has Supervision of high risk pregnancy, antepartum; Bipolar 1 disorder (HCC); Hashimoto's thyroiditis; Obesity; Biological false positive RPR test; History of genetic disease; History of prior pregnancy with IUGR newborn; and Unwanted fertility on their problem list.  Patient reports heartburn.  Contractions: Not present. Vag. Bleeding: None.  Movement: Present. Denies leaking of fluid.   The following portions of the patient's history were reviewed and updated as appropriate: allergies, current medications, past family history, past medical history, past social history, past surgical history and problem list. Problem list updated.  Objective:   Vitals:   01/13/18 1007  BP: 103/67  Pulse: 97  Weight: 262 lb 6.4 oz (119 kg)    Fetal Status: Fetal Heart Rate (bpm): 150   Movement: Present     General:  Alert, oriented and cooperative. Patient is in no acute distress.  Skin: Skin is warm and dry. No rash noted.   Cardiovascular: Normal heart rate noted  Respiratory: Normal respiratory effort, no problems with respiration noted  Abdomen: Soft, gravid, appropriate for gestational age.  Pain/Pressure: Present     Pelvic: Cervical exam deferred        Extremities: Normal range of motion.  Edema: Trace  Mental Status: Normal mood and affect. Normal behavior. Normal judgment and thought content.   Assessment and Plan:  Pregnancy: G3P2002 at 1068w6d  1. Encounter for supervision of other normal pregnancy  Needs to schedule 2 hr GTT  2. Class 3 severe obesity due to excess calories without serious comorbidity with body mass index (BMI) of 40.0 to 44.9 in adult [facility](HCC) At risk for macrosomia and diabetes, HTN  Preterm labor symptoms and general obstetric precautions including but  not limited to vaginal bleeding, contractions, leaking of fluid and fetal movement were reviewed in detail with the patient. Please refer to After Visit Summary for other counseling recommendations.  Return in about 2 weeks (around 01/27/2018) for 2 hr.  Future Appointments  Date Time Provider Department Center  01/14/2018  2:30 PM [facility] [place] 1 [facility]  may use otc antacids for reflux sx  Scheryl DarterJames Kiara Mcdowell, MD

## 2018-01-13 NOTE — Progress Notes (Signed)
Patient reports good fetal movement with pressure. Pt complains of heartburn.

## 2018-01-14 ENCOUNTER — Encounter (HOSPITAL_COMMUNITY): Payer: Self-pay

## 2018-01-14 ENCOUNTER — Ambulatory Visit (HOSPITAL_COMMUNITY)
Admission: RE | Admit: 2018-01-14 | Discharge: 2018-01-14 | Disposition: A | Discharge status: Home / Self Care | Payer: Medicaid Other | Source: Ambulatory Visit | Attending: Nurse Practitioner | Admitting: Nurse Practitioner

## 2018-01-14 DIAGNOSIS — O09293 Supervision of pregnancy with other poor reproductive or obstetric history, third trimester: Secondary | ICD-10-CM | POA: Insufficient documentation

## 2018-01-14 DIAGNOSIS — Z3A28 28 weeks gestation of pregnancy: Secondary | ICD-10-CM | POA: Diagnosis not present

## 2018-01-14 DIAGNOSIS — O2693 Pregnancy related conditions, unspecified, third trimester: Secondary | ICD-10-CM

## 2018-01-14 DIAGNOSIS — Z362 Encounter for other antenatal screening follow-up: Secondary | ICD-10-CM | POA: Diagnosis present

## 2018-01-14 DIAGNOSIS — O352XX0 Maternal care for (suspected) hereditary disease in fetus, not applicable or unspecified: Secondary | ICD-10-CM

## 2018-01-14 DIAGNOSIS — Z3009 Encounter for other general counseling and advice on contraception: Secondary | ICD-10-CM

## 2018-01-15 ENCOUNTER — Other Ambulatory Visit (HOSPITAL_COMMUNITY): Payer: Self-pay | Admitting: *Deleted

## 2018-01-15 DIAGNOSIS — Z87798 Personal history of other (corrected) congenital malformations: Secondary | ICD-10-CM

## 2018-01-18 IMAGING — US US MFM FETAL BPP W/O NON-STRESS
1 series · 12 of 17 positions shown · non-contrast
Comparison: none

[Series 1: us mfm fetal bpp w/o non-stress · 17 acquisitions, 12 frames shown]
[im 1/17]
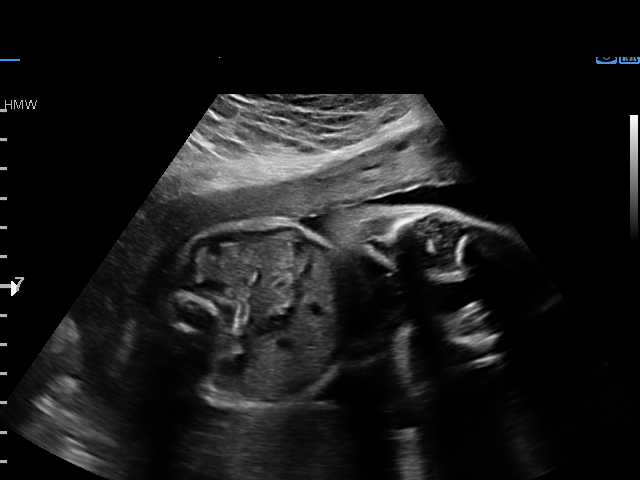
[im 3/17]
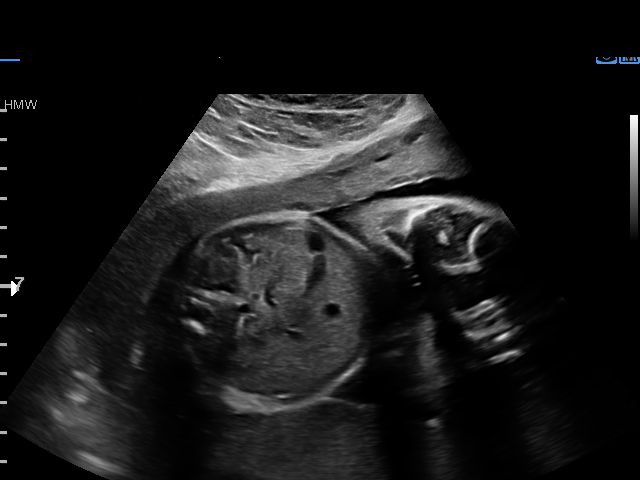
[im 4/17]
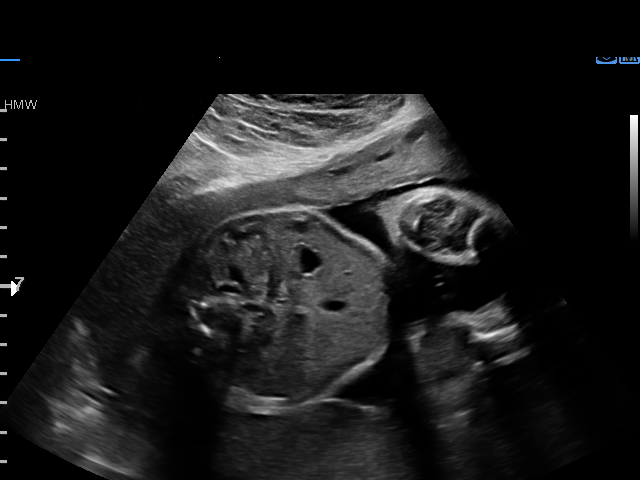
[im 5/17]
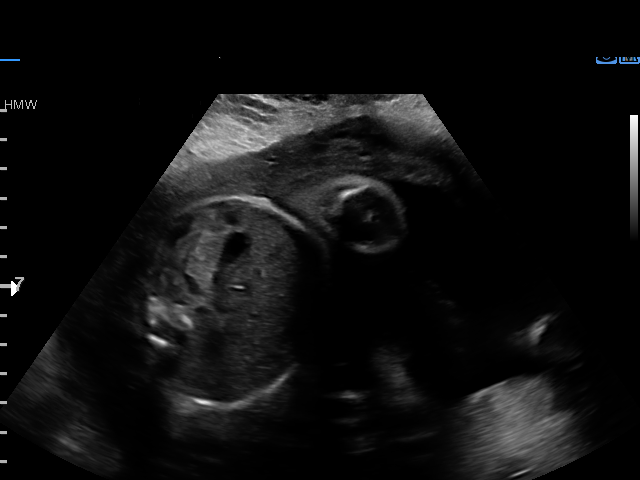
[im 7/17]
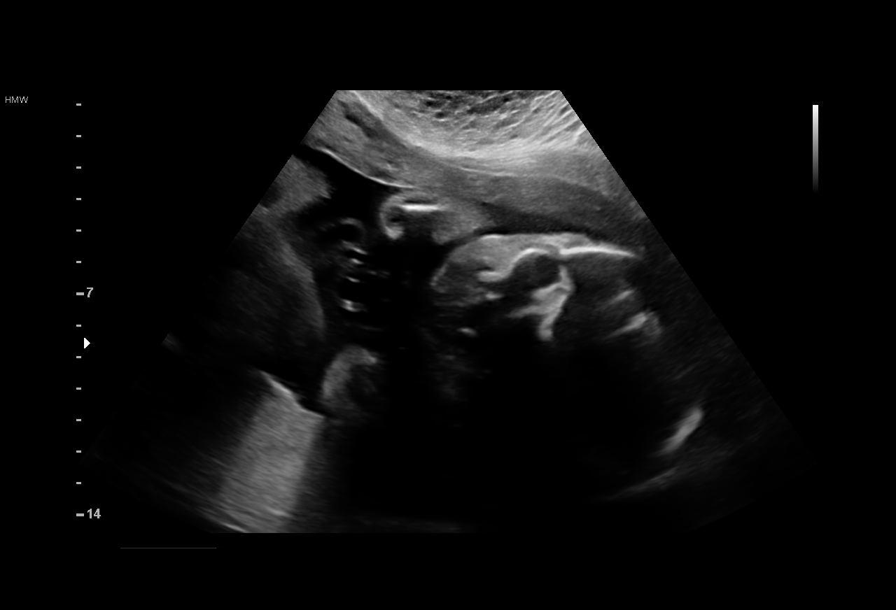
[im 8/17]
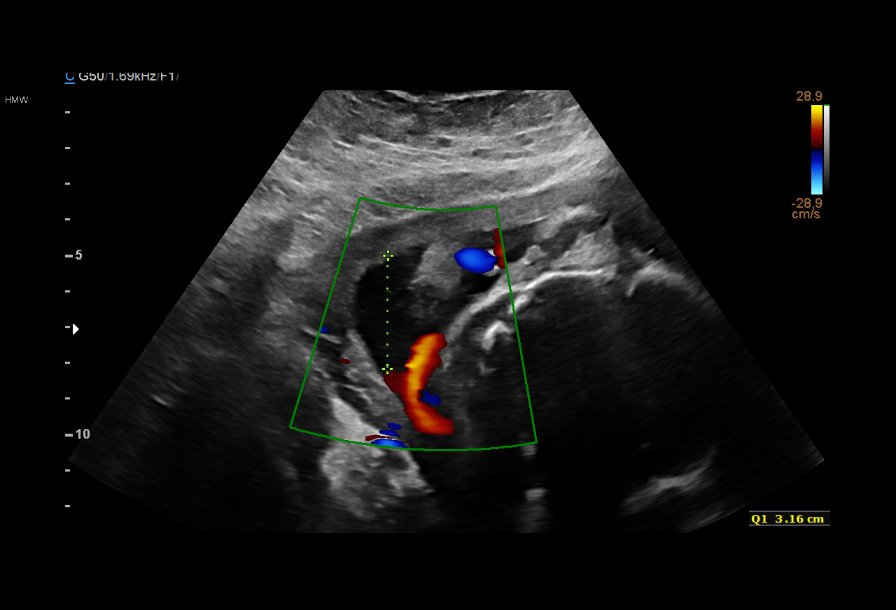
[im 10/17]
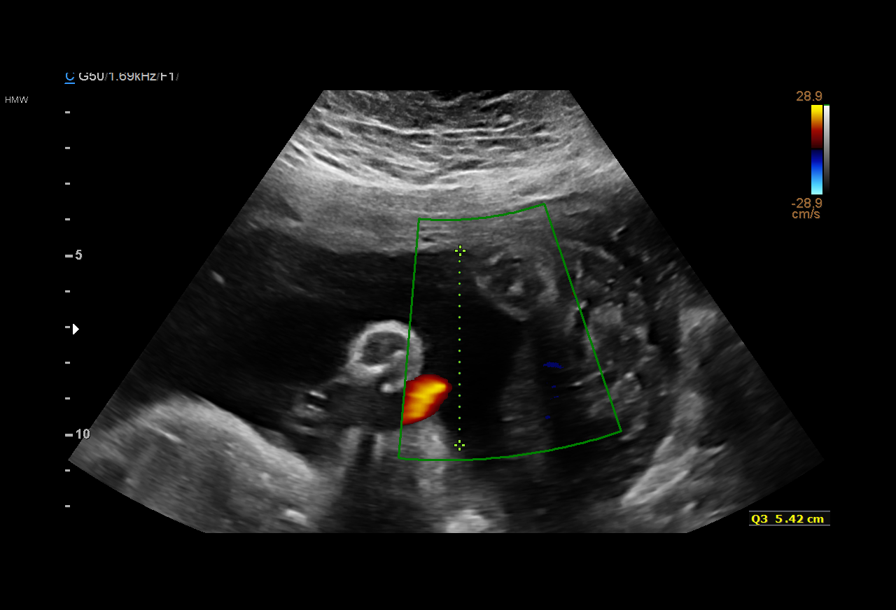
[im 11/17]
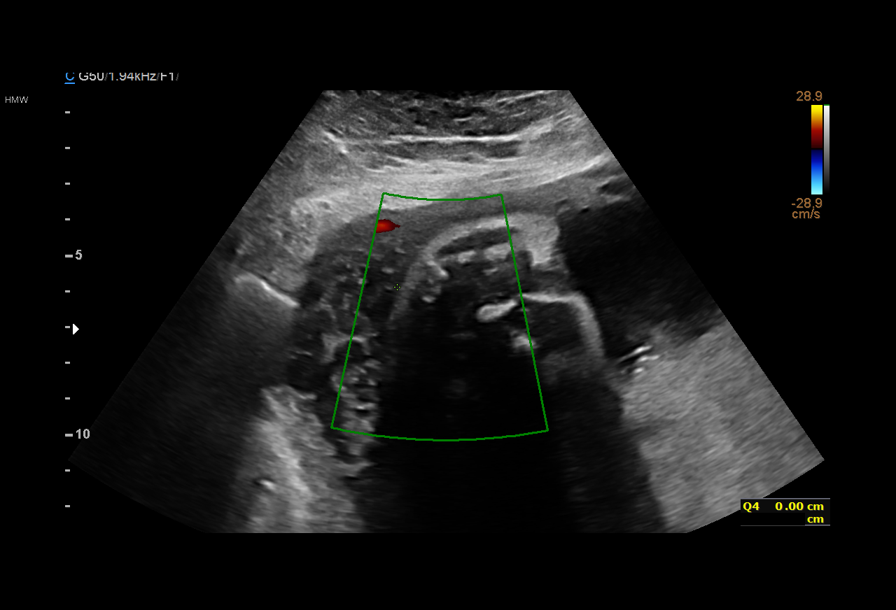
[im 13/17]
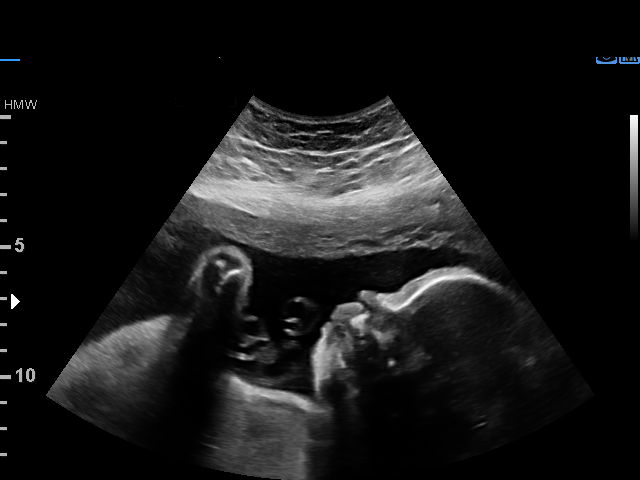
[im 14/17]
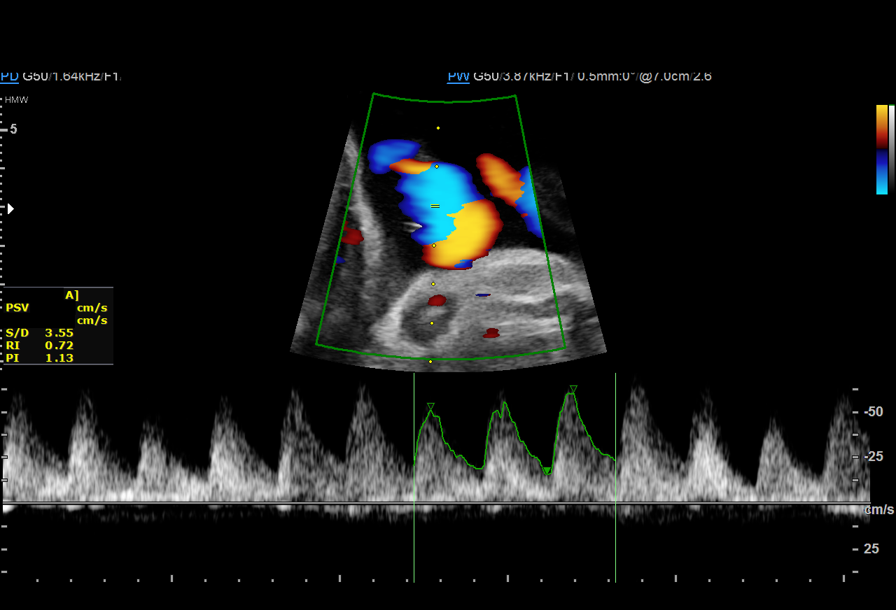
[im 15/17]
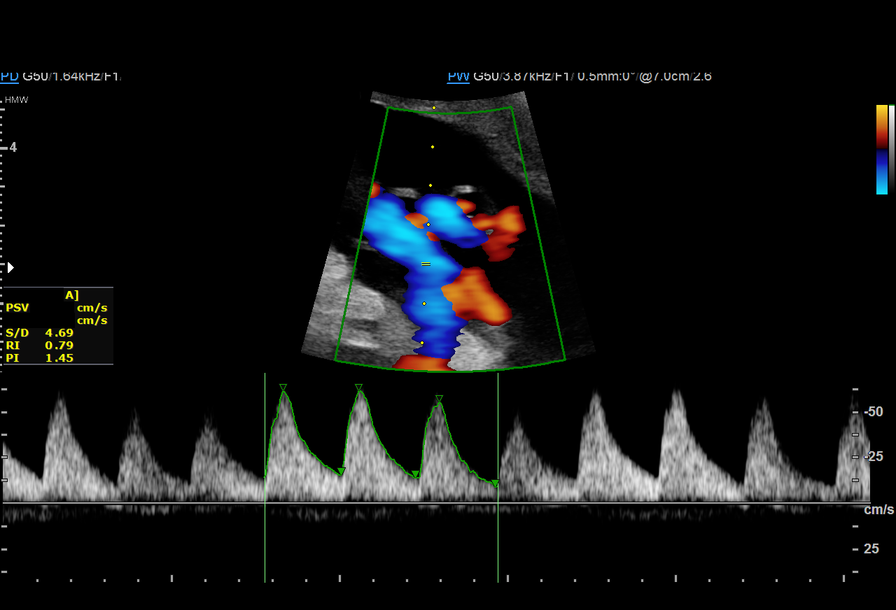
[im 17/17]
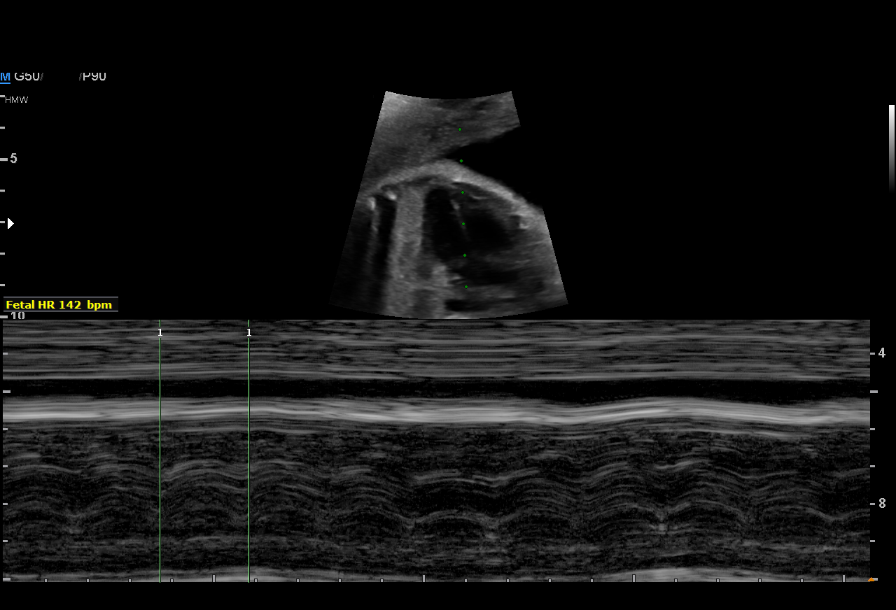

[12 of 17 positions shown; findings below may reference images not displayed]

OB/Gyn Clinic

1  YASHIKA PREET            [id number]      [id number]     [id number]
2  HULJO APPEL            [id number]      [id number]     [id number]
Indications

30 weeks gestation of pregnancy
Family history of genetic disorder (NUQZAR
syndrome)
Previous child with congenital anomaly,
antepartum (pelvic kidney)
Poor obstetric history: Previous fetal growth
restriction (FGR; 5+? at 38 weeks)
Thyroid disease in pregnancy                   O99.280,
Maternal care for known or suspected poor
fetal growth, third trimester, fetus 1
Obesity complicating pregnancy, third
trimester
OB History

Blood Type:            Height:  5'2"   Weight (lb):  230       BMI:
Gravidity:    2         Term:   1        Prem:   0        SAB:   0
TOP:          0       Ectopic:  0        Living: 1
Fetal Evaluation
Num Of Fetuses:     1
Fetal Heart         142
Rate(bpm):
Cardiac Activity:   Observed
Presentation:       Cephalic

Amniotic Fluid
AFI FV:      Subjectively within normal limits

AFI Sum(cm)     %Tile       Largest Pocket(cm)
11.48           25

RUQ(cm)       RLQ(cm)       LUQ(cm)        LLQ(cm)
3.16          0
Biophysical Evaluation

Amniotic F.V:   Within normal limits       F. Tone:        Observed
F. Movement:    Observed                   Score:          [DATE]
F. Breathing:   Observed
Gestational Age

LMP:           30w 0d        Date:  03/07/16                 EDD:   12/12/16
Best:          30w 0d     Det. By:  LMP  (03/07/16)          EDD:   12/12/16
Doppler - Fetal Vessels

Umbilical Artery
S/D     %tile     RI              PI              PSV    ADFV    RDFV
(cm/s)
3.7       87   0.73             1.21             56.24       N       N

Impression

IUP at 30+0 weeks with IUGR
Normal amniotic fluid volume
BPP [DATE]
Normal umbilical artery doppler studies
Recommendations

Continue weekly BPP and doppler studies

## 2018-02-01 ENCOUNTER — Encounter: Payer: Self-pay | Admitting: Obstetrics and Gynecology

## 2018-02-01 ENCOUNTER — Other Ambulatory Visit: Payer: Medicaid Other

## 2018-02-01 ENCOUNTER — Ambulatory Visit (INDEPENDENT_AMBULATORY_CARE_PROVIDER_SITE_OTHER): Payer: Medicaid Other | Admitting: Obstetrics and Gynecology

## 2018-02-01 VITALS — BP 110/74 | HR 94 | Wt 263.0 lb

## 2018-02-01 DIAGNOSIS — Z3009 Encounter for other general counseling and advice on contraception: Secondary | ICD-10-CM

## 2018-02-01 DIAGNOSIS — Z6841 Body Mass Index (BMI) 40.0 and over, adult: Secondary | ICD-10-CM

## 2018-02-01 DIAGNOSIS — O0990 Supervision of high risk pregnancy, unspecified, unspecified trimester: Secondary | ICD-10-CM

## 2018-02-01 DIAGNOSIS — E063 Autoimmune thyroiditis: Secondary | ICD-10-CM

## 2018-02-01 DIAGNOSIS — Z8759 Personal history of other complications of pregnancy, childbirth and the puerperium: Secondary | ICD-10-CM

## 2018-02-01 DIAGNOSIS — O0993 Supervision of high risk pregnancy, unspecified, third trimester: Secondary | ICD-10-CM

## 2018-02-01 DIAGNOSIS — E66813 Obesity, class 3: Secondary | ICD-10-CM

## 2018-02-01 DIAGNOSIS — Z87898 Personal history of other specified conditions: Secondary | ICD-10-CM

## 2018-02-01 NOTE — Progress Notes (Signed)
   PRENATAL VISIT NOTE  Subjective:  Alyssa Hartman is a 22 y.o. G3P2002 at 42102w4d being seen today for ongoing prenatal care.  She is currently monitored for the following issues for this high-risk pregnancy and has Supervision of high risk pregnancy, antepartum; Bipolar 1 disorder (HCC); Hashimoto's thyroiditis; Obesity; Biological false positive RPR test; History of genetic disease; History of prior pregnancy with IUGR newborn; and Unwanted fertility on their problem list.  Patient reports bilateral wrist pain in the evening.  Contractions: Irritability. Vag. Bleeding: None.  Movement: Present. Denies leaking of fluid.   The following portions of the patient's history were reviewed and updated as appropriate: allergies, current medications, past family history, past medical history, past social history, past surgical history and problem list. Problem list updated.  Objective:   Vitals:   02/01/18 0823  BP: 110/74  Pulse: 94  Weight: 263 lb (119.3 kg)    Fetal Status: Fetal Heart Rate (bpm): 146 Fundal Height: 31 cm Movement: Present     General:  Alert, oriented and cooperative. Patient is in no acute distress.  Skin: Skin is warm and dry. No rash noted.   Cardiovascular: Normal heart rate noted  Respiratory: Normal respiratory effort, no problems with respiration noted  Abdomen: Soft, gravid, appropriate for gestational age.  Pain/Pressure: Absent     Pelvic: Cervical exam deferred        Extremities: Normal range of motion.  Edema: Trace  Mental Status: Normal mood and affect. Normal behavior. Normal judgment and thought content.   Assessment and Plan:  Pregnancy: G3P2002 at 39102w4d  1. Supervision of high risk pregnancy, antepartum Patient is doing well Encouraged the use of bilateral wrist brace to ease wrist pain Third trimester labs today  2. History of prior pregnancy with IUGR newborn Followed by MFM  3. Class 3 severe obesity due to excess calories without serious  comorbidity with body mass index (BMI) of 40.0 to 44.9 in adult [facility](HCC) Follow up ultrasound scheduled with MFM  4. Unwanted fertility BTL forms signed today  5. Hashimoto's thyroiditis Normal TSH 10/2017  6. History of genetic disease   Preterm labor symptoms and general obstetric precautions including but not limited to vaginal bleeding, contractions, leaking of fluid and fetal movement were reviewed in detail with the patient. Please refer to After Visit Summary for other counseling recommendations.  Return in about 2 weeks (around 02/15/2018) for ROB.  Future Appointments  Date Time Provider Department Center  02/25/2018  1:00 PM [facility] US 3 [facility]    Catalina AntiguaPeggy Cj Edgell, MD

## 2018-02-01 NOTE — Progress Notes (Signed)
CC: Hand / wrist pain mostly at night.

## 2018-02-03 ENCOUNTER — Other Ambulatory Visit: Payer: Self-pay | Admitting: Obstetrics and Gynecology

## 2018-02-03 ENCOUNTER — Encounter: Payer: Self-pay | Admitting: Obstetrics and Gynecology

## 2018-02-03 ENCOUNTER — Telehealth: Payer: Self-pay

## 2018-02-03 DIAGNOSIS — O24419 Gestational diabetes mellitus in pregnancy, unspecified control: Secondary | ICD-10-CM

## 2018-02-03 HISTORY — DX: Gestational diabetes mellitus in pregnancy, unspecified control: O24.419

## 2018-02-03 LAB — RPR: RPR Ser Ql: REACTIVE — AB

## 2018-02-03 LAB — CBC
Hematocrit: 32.8 % — ABNORMAL LOW (ref 34.0–46.6)
Hemoglobin: 10.5 g/dL — ABNORMAL LOW (ref 11.1–15.9)
MCH: 25.6 pg — ABNORMAL LOW (ref 26.6–33.0)
MCHC: 32 g/dL (ref 31.5–35.7)
MCV: 80 fL (ref 79–97)
Platelets: 188 10*3/uL (ref 150–450)
RBC: 4.1 x10E6/uL (ref 3.77–5.28)
RDW: 16.2 % — ABNORMAL HIGH (ref 12.3–15.4)
WBC: 9.1 10*3/uL (ref 3.4–10.8)

## 2018-02-03 LAB — GLUCOSE TOLERANCE, 2 HOURS W/ 1HR
Glucose, 1 hour: 210 mg/dL — ABNORMAL HIGH (ref 65–179)
Glucose, 2 hour: 104 mg/dL (ref 65–152)
Glucose, Fasting: 95 mg/dL — ABNORMAL HIGH (ref 65–91)

## 2018-02-03 LAB — HIV ANTIBODY (ROUTINE TESTING W REFLEX): HIV Screen 4th Generation wRfx: NONREACTIVE

## 2018-02-03 LAB — RPR, QUANT+TP ABS (REFLEX)
Rapid Plasma Reagin, Quant: 1:2 {titer} — ABNORMAL HIGH
T Pallidum Abs: NONREACTIVE

## 2018-02-03 MED ORDER — ACCU-CHEK FASTCLIX LANCETS MISC: 1.0000 [IU] | Freq: Four times a day (QID) | 12 refills | Status: DC

## 2018-02-03 MED ORDER — ACCU-CHEK NANO SMARTVIEW W/DEVICE KIT: 1.0000 | PACK | 0 refills | Status: DC

## 2018-02-03 MED ORDER — GLUCOSE BLOOD VI STRP: ORAL_STRIP | 12 refills | Status: DC

## 2018-02-03 NOTE — Telephone Encounter (Signed)
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 I have e-prescribed her testing supplies. She will be referred to diabetes education/nutrition  Thanks  Peggy

## 2018-02-04 ENCOUNTER — Telehealth: Payer: Self-pay

## 2018-02-04 MED ORDER — ACCU-CHEK GUIDE W/DEVICE KIT: 1.0000 | PACK | Freq: Four times a day (QID) | 0 refills | Status: DC

## 2018-02-04 NOTE — Telephone Encounter (Signed)
Returned call, pt aware of GDM results and referral, appt is 03/03/18

## 2018-02-10 ENCOUNTER — Telehealth: Payer: Self-pay

## 2018-02-10 ENCOUNTER — Ambulatory Visit: Payer: Medicaid Other | Admitting: Registered"

## 2018-02-10 NOTE — Telephone Encounter (Signed)
Pt called stating that she is having episodes of headaches and dizziness. Pt states that she feels fine right now. She states that she is unable to check her BP. Advised pt to be evaluated if sx return to make sure her bp is normal.

## 2018-02-11 ENCOUNTER — Encounter: Payer: Self-pay | Admitting: Registered"

## 2018-02-11 ENCOUNTER — Encounter: Payer: Medicaid Other | Attending: Obstetrics and Gynecology | Admitting: Registered"

## 2018-02-11 DIAGNOSIS — O99810 Abnormal glucose complicating pregnancy: Secondary | ICD-10-CM | POA: Insufficient documentation

## 2018-02-11 DIAGNOSIS — O24419 Gestational diabetes mellitus in pregnancy, unspecified control: Secondary | ICD-10-CM | POA: Insufficient documentation

## 2018-02-11 NOTE — Progress Notes (Signed)
Patient was seen on 02/11/18 for Gestational Diabetes self-management education at [facility]. The following learning objectives were met by the patient during this course:   States the definition of Gestational Diabetes  States why dietary management is important in controlling blood glucose  Describes the effects each nutrient has on blood glucose levels  Demonstrates ability to create a balanced meal plan  Demonstrates carbohydrate counting   States when to check blood glucose levels  Demonstrates proper blood glucose monitoring techniques  States the effect of stress and exercise on blood glucose levels  States the importance of limiting caffeine and abstaining from alcohol and smoking  Blood glucose monitor given: Accu-chek guide me Lot # [id number] Exp: 03/24/19 Blood glucose reading: 93 mg/dL  Patient instructed to monitor glucose levels: FBS: 60 - <95; 1 hour: <140; 2 hour: <120  Patient received handouts:  Nutrition Diabetes and Pregnancy, including carb counting list  Patient will be seen for follow-up as needed.

## 2018-02-13 ENCOUNTER — Encounter (HOSPITAL_COMMUNITY): Payer: Self-pay

## 2018-02-13 ENCOUNTER — Inpatient Hospital Stay (HOSPITAL_COMMUNITY)
Admission: AD | Admit: 2018-02-13 | Discharge: 2018-02-13 | Disposition: A | Discharge status: Home / Self Care | Payer: Medicaid Other | Source: Ambulatory Visit | Attending: Obstetrics and Gynecology | Admitting: Obstetrics and Gynecology

## 2018-02-13 DIAGNOSIS — O99283 Endocrine, nutritional and metabolic diseases complicating pregnancy, third trimester: Secondary | ICD-10-CM | POA: Diagnosis not present

## 2018-02-13 DIAGNOSIS — R071 Chest pain on breathing: Secondary | ICD-10-CM | POA: Insufficient documentation

## 2018-02-13 DIAGNOSIS — E063 Autoimmune thyroiditis: Secondary | ICD-10-CM | POA: Diagnosis not present

## 2018-02-13 DIAGNOSIS — N949 Unspecified condition associated with female genital organs and menstrual cycle: Secondary | ICD-10-CM | POA: Diagnosis not present

## 2018-02-13 DIAGNOSIS — O26893 Other specified pregnancy related conditions, third trimester: Secondary | ICD-10-CM | POA: Diagnosis not present

## 2018-02-13 DIAGNOSIS — M533 Sacrococcygeal disorders, not elsewhere classified: Secondary | ICD-10-CM

## 2018-02-13 DIAGNOSIS — G5600 Carpal tunnel syndrome, unspecified upper limb: Secondary | ICD-10-CM | POA: Diagnosis not present

## 2018-02-13 DIAGNOSIS — R0789 Other chest pain: Secondary | ICD-10-CM

## 2018-02-13 DIAGNOSIS — Z3A32 32 weeks gestation of pregnancy: Secondary | ICD-10-CM | POA: Insufficient documentation

## 2018-02-13 DIAGNOSIS — Z3009 Encounter for other general counseling and advice on contraception: Secondary | ICD-10-CM

## 2018-02-13 DIAGNOSIS — M545 Low back pain: Secondary | ICD-10-CM | POA: Diagnosis present

## 2018-02-13 DIAGNOSIS — Z87891 Personal history of nicotine dependence: Secondary | ICD-10-CM | POA: Insufficient documentation

## 2018-02-13 DIAGNOSIS — G8929 Other chronic pain: Secondary | ICD-10-CM | POA: Diagnosis not present

## 2018-02-13 DIAGNOSIS — R102 Pelvic and perineal pain: Secondary | ICD-10-CM | POA: Diagnosis not present

## 2018-02-13 DIAGNOSIS — O99353 Diseases of the nervous system complicating pregnancy, third trimester: Secondary | ICD-10-CM | POA: Insufficient documentation

## 2018-02-13 HISTORY — DX: Carpal tunnel syndrome, unspecified upper limb: G56.00

## 2018-02-13 LAB — URINALYSIS, ROUTINE W REFLEX MICROSCOPIC
Bilirubin Urine: NEGATIVE
Glucose, UA: NEGATIVE mg/dL
Hgb urine dipstick: NEGATIVE
Ketones, ur: 80 mg/dL — AB
Leukocytes, UA: NEGATIVE
Nitrite: NEGATIVE
Protein, ur: NEGATIVE mg/dL
Specific Gravity, Urine: 1.013 (ref 1.005–1.030)
pH: 7 (ref 5.0–8.0)

## 2018-02-13 NOTE — MAU Provider Note (Addendum)
History     CSN: [id number]  Arrival date and time: 02/13/18 1228   First Provider Initiated Contact with Patient 02/13/18 1314      Chief Complaint  Patient presents with  . Back Pain  . Abdominal Pain  . Rib cage pain   HPI   Alyssa Hartman is a 22 y.o. 353P2002 female at 6756w2d who presents to MAU with multiple concerns. Has had lower back pain throughout most of her pregnancy bilaterally near the sacrum. Pain does not radiate down her legs. It seems to worsen with sudden movements and walking.   Notes that she started having bilateral groin pain that is shooting over the past couple of weeks. This pain increases with walking and improves with rest. She denies LOF or vaginal bleeding. Good fetal movement.   Reports lower rib pain bilaterally. Pain is achy. Not currently occurring. Started over the past few days. Denies cough, SOB, chest pain.     OB History    Gravida  3   Para  2   Term  2   Preterm  0   AB      Living  2     SAB      TAB      Ectopic      Multiple  0   Live Births  2           Past Medical History:  Diagnosis Date  . Carpal tunnel syndrome   . Hx of Hashimoto thyroiditis   . Hypothyroid     Past Surgical History:  Procedure Laterality Date  . NO PAST SURGERIES      Family History  Problem Relation Age of Onset  . Diabetes Maternal Grandmother   . Hypertension Maternal Grandmother     Social History   Tobacco Use  . Smoking status: Former Games developermoker  . Smokeless tobacco: Never Used  Substance Use Topics  . Alcohol use: No  . Drug use: No    Allergies: No Known Allergies  No medications prior to admission.    Review of Systems  Constitutional: Negative for chills and fever.  Respiratory: Negative for cough, chest tightness, shortness of breath and wheezing.   Cardiovascular: Negative for chest pain and leg swelling.  Gastrointestinal: Negative for constipation, nausea and vomiting.  Genitourinary: Negative  for difficulty urinating, dysuria, flank pain, frequency, urgency, vaginal bleeding and vaginal discharge.  Musculoskeletal: Positive for back pain.  Skin: Negative for rash.   Physical Exam   Blood pressure 118/71, pulse (!) 105, temperature 98.5 F (36.9 C), temperature source Oral, resp. rate 18, weight 119 kg, last menstrual period 07/02/2017, SpO2 100 %, unknown if currently breastfeeding.  Physical Exam  Constitutional: She is oriented to person, place, and time. She appears well-developed and well-nourished. No distress.  HENT:  Head: Normocephalic and atraumatic.  Eyes: Conjunctivae and EOM are normal.  Neck: Normal range of motion. Neck supple.  Cardiovascular: Normal rate, regular rhythm and normal heart sounds.  No murmur heard. Respiratory: Effort normal and breath sounds normal. No respiratory distress. She has no wheezes. She exhibits no tenderness.  GI: Soft. She exhibits no distension. There is no abdominal tenderness. There is no rebound and no guarding.  Musculoskeletal: Normal range of motion.        General: No edema.  Neurological: She is alert and oriented to person, place, and time.  Skin: Skin is warm and dry. She is not diaphoretic.  Psychiatric: She has a normal mood and  affect. Her behavior is normal.   FHT: 130 bpm, moderate variability, +acels, no decels  Toco: No contractions.   MAU Course  Procedures  MDM Physical exam performed. NST reactive.   Assessment and Plan   1. Round ligament pain   2. Unwanted fertility   3. Chronic SI joint pain   4. Costochondral chest pain   5. [redacted] weeks gestation of pregnancy     Pain in groin is consistent with round ligament pain. No concerning pelvic symptoms. Patient is not having contractions so elected not to perform SSE. Discussed pregnancy belt, stretches, heat and tylenol as modalities to improve comfort. Educated that this a benign, common cause of discomfort in pregnancy.   Pain in the lower back has  been chronic throughout most of pregnancy. Pain is located in the area of the SI joints. Suspect it is from opening of joints during pregnancy. Discussed similar supportive care treatments.   Pain in the ribs seems to be musculoskeletal. Patient without respiratory complaints and lungs are clear. Considered PE but patient has no signs/symptoms of DVT on exam. Pain is not located in classic area for a PE and is currently not occurring. She has not other symptoms consistent with PE. Strict return precautions discussed.   [name] [name] 02/13/2018, 2:14 PM

## 2018-02-13 NOTE — MAU Note (Signed)
Started having pain in rib cage yesterday, sore, intermittent, not having pain currently  Having lower abdominal cramping ongoing for a couple weeks, more when shes walking  Ongoing back pain  States she had GDM  No vaginal bleeding, no LOF, + FM

## 2018-02-13 NOTE — Discharge Instructions (Signed)
I recommend using a pregnancy band or belt to re-distribute the weight off the ligaments in your groin and the joints in your lower back. Tylenol is safe to take in pregnancy for aches and pains. Some women find that a heating pad or ice to the lower back. Taking a warm bath especially with Epsom salt can also be helpful.    Round Ligament Pain  The round ligament is a cord of muscle and tissue that helps support the uterus. It can become a source of pain during pregnancy if it becomes stretched or twisted as the baby grows. The pain usually begins in the second trimester (13-28 weeks) of pregnancy, and it can come and go until the baby is delivered. It is not a serious problem, and it does not cause harm to the baby. Round ligament pain is usually a short, sharp, and pinching pain, but it can also be a dull, lingering, and aching pain. The pain is felt in the lower side of the abdomen or in the groin. It usually starts deep in the groin and moves up to the outside of the hip area. The pain may occur when you:  Suddenly change position, such as quickly going from a sitting to standing position.  Roll over in bed.  Cough or sneeze.  Do physical activity. Follow these instructions at home:   Watch your condition for any changes.  When the pain starts, relax. Then try any of these methods to help with the pain: ? Sitting down. ? Flexing your knees up to your abdomen. ? Lying on your side with one pillow under your abdomen and another pillow between your legs. ? Sitting in a warm bath for 15-20 minutes or until the pain goes away.  Take over-the-counter and prescription medicines only as told by your health care provider.  Move slowly when you sit down or stand up.  Avoid long walks if they cause pain.  Stop or reduce your physical activities if they cause pain.  Keep all follow-up visits as told by your health care provider. This is important. Contact a health care provider  if:  Your pain does not go away with treatment.  You feel pain in your back that you did not have before.  Your medicine is not helping. Get help right away if:  You have a fever or chills.  You develop uterine contractions.  You have vaginal bleeding.  You have nausea or vomiting.  You have diarrhea.  You have pain when you urinate. Summary  Round ligament pain is felt in the lower abdomen or groin. It is usually a short, sharp, and pinching pain. It can also be a dull, lingering, and aching pain.  This pain usually begins in the second trimester (13-28 weeks). It occurs because the uterus is stretching with the growing baby, and it is not harmful to the baby.  You may notice the pain when you suddenly change position, when you cough or sneeze, or during physical activity.  Relaxing, flexing your knees to your abdomen, lying on one side, or taking a warm bath may help to get rid of the pain.  Get help from your health care provider if the pain does not go away or if you have vaginal bleeding, nausea, vomiting, diarrhea, or painful urination. This information is not intended to replace advice given to you by your health care provider. Make sure you discuss any questions you have with your health care provider. Document Released: 11/20/2007 Document Revised:  07/29/2017 Document Reviewed: 07/29/2017 Elsevier Interactive Patient Education  [employer]2019 [employer]

## 2018-02-15 ENCOUNTER — Ambulatory Visit (INDEPENDENT_AMBULATORY_CARE_PROVIDER_SITE_OTHER): Payer: Medicaid Other | Admitting: Obstetrics and Gynecology

## 2018-02-15 VITALS — BP 115/67 | HR 102 | Wt 267.0 lb

## 2018-02-15 DIAGNOSIS — Z8759 Personal history of other complications of pregnancy, childbirth and the puerperium: Secondary | ICD-10-CM

## 2018-02-15 DIAGNOSIS — O0993 Supervision of high risk pregnancy, unspecified, third trimester: Secondary | ICD-10-CM

## 2018-02-15 DIAGNOSIS — R768 Other specified abnormal immunological findings in serum: Secondary | ICD-10-CM

## 2018-02-15 DIAGNOSIS — R7689 Other specified abnormal immunological findings in serum: Secondary | ICD-10-CM

## 2018-02-15 DIAGNOSIS — E063 Autoimmune thyroiditis: Secondary | ICD-10-CM

## 2018-02-15 DIAGNOSIS — O0990 Supervision of high risk pregnancy, unspecified, unspecified trimester: Secondary | ICD-10-CM

## 2018-02-15 DIAGNOSIS — O24419 Gestational diabetes mellitus in pregnancy, unspecified control: Secondary | ICD-10-CM

## 2018-02-15 MED ORDER — ACCU-CHEK GUIDE W/DEVICE KIT: 1.0000 | PACK | Freq: Once | 0 refills | Status: AC

## 2018-02-15 MED ORDER — GLUCOSE BLOOD VI STRP: ORAL_STRIP | 12 refills | Status: DC

## 2018-02-15 MED ORDER — COMFORT FIT MATERNITY SUPP LG MISC: 1.0000 [IU] | 0 refills | Status: DC | PRN

## 2018-02-15 NOTE — Progress Notes (Signed)
Subjective:  Alyssa Hartman is a 22 y.o. G3P2002 at 1546w4d being seen today for ongoing prenatal care.  She is currently monitored for the following issues for this high-risk pregnancy and has Supervision of high risk pregnancy, antepartum; Bipolar 1 disorder (HCC); Hashimoto's thyroiditis; Obesity; Biological false positive RPR test; History of genetic disease; History of prior pregnancy with IUGR newborn; Unwanted fertility; and Gestational diabetes on their problem list.  Patient reports general discomforts of pregnancy.  Contractions: Not present. Vag. Bleeding: None.  Movement: Present. Denies leaking of fluid.   The following portions of the patient's history were reviewed and updated as appropriate: allergies, current medications, past family history, past medical history, past social history, past surgical history and problem list. Problem list updated.  Objective:   Vitals:   02/15/18 1327  BP: 115/67  Pulse: (!) 102  Weight: 267 lb (121.1 kg)    Fetal Status:     Movement: Present     General:  Alert, oriented and cooperative. Patient is in no acute distress.  Skin: Skin is warm and dry. No rash noted.   Cardiovascular: Normal heart rate noted  Respiratory: Normal respiratory effort, no problems with respiration noted  Abdomen: Soft, gravid, appropriate for gestational age. Pain/Pressure: Present     Pelvic:  Cervical exam deferred        Extremities: Normal range of motion.     Mental Status: Normal mood and affect. Normal behavior. Normal judgment and thought content.   Urinalysis:      Assessment and Plan:  Pregnancy: G3P2002 at 6546w4d  1. Supervision of high risk pregnancy, antepartum Stable - Elastic Bandages & Supports (COMFORT FIT MATERNITY SUPP LG) MISC; 1 Units by Does not apply route as needed. Maternity support belt as needed  Dispense: 1 each; Refill: 0  2. Gestational diabetes mellitus (GDM) in third trimester, gestational diabetes method of control  unspecified Has not been checking CBG's GDM and pregnancy reviewed with pt Importance of following diet and recording CBG's discussed Instructed to bring CBG readings to OB appt Will bring pt back next week to check CBG's  3. History of prior pregnancy with IUGR newborn Growth scan next week  4. Hashimoto's thyroiditis - Thyroid Panel With TSH  5. Biological false positive RPR test - RPR  Preterm labor symptoms and general obstetric precautions including but not limited to vaginal bleeding, contractions, leaking of fluid and fetal movement were reviewed in detail with the patient. Please refer to After Visit Summary for other counseling recommendations.  Return in about 1 week (around 02/22/2018) for OB visit.   Hermina StaggersErvin, Tacori Kvamme L, MD

## 2018-02-15 NOTE — Patient Instructions (Addendum)
Gestational Diabetes Mellitus, Self Care When you have gestational diabetes (gestational diabetes mellitus), you must make sure your blood sugar (glucose) stays in a healthy range. You can do this with:  Nutrition.  Exercise.  Lifestyle changes.  Medicines or insulin, if needed.  Support from your doctors and others. If you get treated for this condition, it may not hurt you or your unborn baby (fetus). If you do not get treated for this condition, it may cause problems that can hurt you or your unborn baby. If you get gestational diabetes, you are:  More likely to get it if you get pregnant again.  More likely to develop type 2 diabetes in the future. How to stay aware of blood sugar   Check your blood sugar every day while you are pregnant. Check it as often as told.  Call your doctor if your blood sugar is above your goal numbers for two tests in a row. Your doctor will set personal treatment goals for you. Generally, you should have these blood sugar levels:  Before meals, or after not eating for a long time (fasting or preprandial): at or below 95 mg/dL (5.3 mmol/L).  After meals (postprandial): ? One hour after a meal: at or below 140 mg/dL (7.8 mmol/L). ? Two hours after a meal: at or below 120 mg/dL (6.7 mmol/L).  A1c (hemoglobin A1c) level: 6-6.5%. How to manage high and low blood sugar Signs of high blood sugar High blood sugar is called hyperglycemia. Know the early signs of high blood sugar. Signs may include:  Feeling: ? Thirsty. ? Hungry. ? Very tired.  Needing to pee (urinate) more than usual.  Blurry vision. Signs of low blood sugar Low blood sugar is called hypoglycemia. This is when blood sugar is at or below 70 mg/dL (3.9 mmol/L). Signs may include:  Feeling: ? Hungry. ? Worried or nervous (anxious). ? Sweaty and clammy. ? Confused. ? Dizzy. ? Sleepy. ? Sick to your stomach (nauseous).  Having: ? A fast heartbeat. ? A headache. ? A change  in your vision. ? Tingling or no feeling (numbness) around your mouth, lips, or tongue. ? Jerky movements that you cannot control (seizure).  Having trouble with: ? Moving (coordination). ? Sleeping. ? Passing out (fainting). ? Getting upset easily (irritability). Treating low blood sugar To treat low blood sugar, eat or drink something sugary right away. If you can think clearly and swallow safely, follow the 15:15 rule:  Take 15 grams of a fast-acting carb (carbohydrate). Talk with your doctor about how much you should take.  Some fast-acting carbs are: ? Sugar tablets (glucose pills). Take 3-4 glucose pills. ? 6-8 pieces of hard candy. ? 4-6 oz (120-150 mL) of fruit juice. ? 4-6 oz (120-150 mL) of regular (not diet) soda. ? 1 Tbsp (15 mL) honey or sugar.  Check your blood sugar 15 minutes after you take the carb.  If your blood sugar is still at or below 70 mg/dL (3.9 mmol/L), take 15 grams of a carb again.  If your blood sugar does not go above 70 mg/dL (3.9 mmol/L) after 3 tries, get help right away.  After your blood sugar goes back to normal, eat a meal or a snack within 1 hour. Treating very low blood sugar If your blood sugar is at or below 54 mg/dL (3 mmol/L), you have very low blood sugar (severe hypoglycemia). This is an emergency. Do not wait to see if the symptoms will go away. Get medical help right  away. Call your local emergency services (911 in the [place] If you have very low blood sugar and you cannot eat or drink, you may need a glucagon shot (injection). A family member or friend should learn how to check your blood sugar and how to give you a glucagon shot. Ask your doctor if you need to have a glucagon shot kit at home. Follow these instructions at home: Medicine  Take your insulin and diabetes medicines as told.  If your doctor says you should take more or less insulin or medicines, do this exactly as told.  Do not run out of insulin or  medicines. Food   Make healthy food choices. These include: ? Chicken, fish, egg whites, and beans. ? Oats, whole wheat, bulgur, brown rice, quinoa, and millet. ? Fresh fruits and vegetables. ? Low-fat dairy products. ? Nuts, avocado, olive oil, and canola oil.  Meet with a food specialist (dietitian). He or she can help you make an eating plan that is right for you.  Follow instructions from your doctor about what you cannot eat or drink.  Drink enough fluid to keep your pee (urine) pale yellow.  Eat healthy snacks between healthy meals.  Keep track of carbs that you eat. Do this by reading food labels and learning food serving sizes.  Follow your sick day plan when you cannot eat or drink normally. Make this plan with your doctor so it is ready to use. Activity  Exercise for 30 or more minutes a day, or as much as your doctor recommends.  Talk with your doctor before you start a new exercise or activity. Your doctor may need to tell you to change: ? How much insulin or medicines you take. ? How much food you eat. Lifestyle  Do not drink alcohol.  Do not use any tobacco products. These include cigarettes, chewing tobacco, and e-cigarettes. If you need help quitting, ask your doctor.  Learn how to deal with stress. If you need help with this, ask your doctor. Body care  Stay up to date with your shots (immunizations).  Brush your teeth and gums two times a day. Floss one or more times a day.  Go to the dentist one or more times every 6 months.  Stay at a healthy weight while you are pregnant. General instructions  Take over-the-counter and prescription medicines only as told by your doctor.  Ask your doctor about risks of high blood pressure in pregnancy (preeclampsia and eclampsia).  Share your diabetes care plan with: ? Your work or school. ? People you live with.  Check your pee for ketones: ? When you are sick. ? As told by your doctor.  Carry a card or  wear jewelry that says you have diabetes.  Keep all follow-up visits as told by your doctor. This is important. Care after giving birth  Have your blood sugar checked 4-12 weeks after you give birth.  Get checked for diabetes one or more times during 3 years. Questions to ask your doctor  Do I need to meet with a diabetes educator?  Where can I find a support group for people with gestational diabetes? Where to find more information To learn more about diabetes, visit:  American Diabetes Association: www.diabetes.org  Centers for Disease Control and Prevention (CDC): www.cdc.gov Summary  Check your blood sugar (glucose) every day while you are pregnant. Check it as often as told.  Take your insulin and diabetes medicines as told.  Keep all follow-up visits as   told by your doctor. This is important.  Have your blood sugar checked 4-12 weeks after you give birth. This information is not intended to replace advice given to you by your health care provider. Make sure you discuss any questions you have with your health care provider. Document Released: 06/04/2015 Document Revised: 08/03/2017 Document Reviewed: 03/16/2015 Elsevier Interactive Patient Education  2019 [employer].

## 2018-02-17 LAB — RPR, QUANT+TP ABS (REFLEX)
Rapid Plasma Reagin, Quant: 1:2 {titer} — ABNORMAL HIGH
T Pallidum Abs: NONREACTIVE

## 2018-02-17 LAB — THYROID PANEL WITH TSH
Free Thyroxine Index: 1.2 (ref 1.2–4.9)
T3 Uptake Ratio: 14 % — ABNORMAL LOW (ref 24–39)
T4, Total: 8.4 ug/dL (ref 4.5–12.0)
TSH: 3.68 u[IU]/mL (ref 0.450–4.500)

## 2018-02-17 LAB — RPR: RPR Ser Ql: REACTIVE — AB

## 2018-02-24 NOTE — L&D Delivery Note (Addendum)
Delivery Note At 4:47 PM a viable female was delivered via Vaginal, Spontaneous (Presentation: Vertex; OA).  APGAR: 5, 8; weight 6 lb 12.6 oz (3080 g).   Placenta status: delivered spontaneously and intact. Cord: 3-vessles with the following complications: none.  Cord pH: N/A  Anesthesia:  Epidural Episiotomy: None Lacerations: None Suture Repair: None Est. Blood Loss (mL): 240  The patient was noted to be complete and pushing. Patient noted to have epidural anesthesia.   The patient was asked to push and the head delivered spontaneously in the OA position, over an intact perineum. A nuchal cord was checked and none was noted.   The anterior shoulder was not delivered easily due to shoulder dystocia lasting no more than 30 seconds. McRobert's position was assumed. Anterior, suprapubic pressure was applied assisting to delivery the anterior shoulder and the posterior shoulder followed. The remainder of the infant was easily delivered, the cord was clamped cut by Dr. Dareen Piano. The baby was passed to the warmer where NICU and nursing staff personnel were in attendance. The infant was noted to have spontaneous cry and equal movement of all 4 extremities, although tone was low, heart rate was elevated, baby was febrile, and lung sounds were raspy. Mother had intrapartum fever noted prior to delivery with fetal tachycardia. Ordered Ampicillin and Gentamycin prior to delivery to treat for presumed Triple I. The oropharynx and nasopharynx were bulb suctioned. The baby was sent to the NICU.  The placenta delivered intact spontaneously. Placenta was sent to pathology due to presumed Triple I. Pitocin was started IV to firm the uterus.   Examination of the cervix and vaginal vault did not reveal any lacerations. The patient's cervix was noted to be edematous, bruised, and displaced interiorly towards the vaginal opening. A vaginal pack was then placed. Examination of the perineum showed no lacerations.  All  sponge and needle counts were correct. Dr. Elita Quick was present for the entire procedure.    Mom to postpartum.  Baby to NICU.  Alyssa Hartman 03/19/2018, 6:17 PM

## 2018-02-25 ENCOUNTER — Other Ambulatory Visit (HOSPITAL_COMMUNITY): Payer: Self-pay | Admitting: Obstetrics and Gynecology

## 2018-02-25 ENCOUNTER — Ambulatory Visit (HOSPITAL_COMMUNITY)
Admission: RE | Admit: 2018-02-25 | Discharge: 2018-02-25 | Disposition: A | Discharge status: Home / Self Care | Payer: Medicaid Other | Source: Ambulatory Visit | Attending: Nurse Practitioner | Admitting: Nurse Practitioner

## 2018-02-25 ENCOUNTER — Encounter: Payer: Self-pay | Admitting: Obstetrics and Gynecology

## 2018-02-25 ENCOUNTER — Ambulatory Visit (INDEPENDENT_AMBULATORY_CARE_PROVIDER_SITE_OTHER): Payer: Medicaid Other | Admitting: Obstetrics and Gynecology

## 2018-02-25 ENCOUNTER — Encounter (HOSPITAL_COMMUNITY): Payer: Self-pay

## 2018-02-25 ENCOUNTER — Ambulatory Visit (HOSPITAL_COMMUNITY)
Admission: RE | Admit: 2018-02-25 | Discharge: 2018-02-25 | Disposition: A | Discharge status: Home / Self Care | Payer: Medicaid Other | Source: Ambulatory Visit | Attending: Obstetrics and Gynecology | Admitting: Obstetrics and Gynecology

## 2018-02-25 VITALS — BP 133/83 | HR 101 | Wt 263.0 lb

## 2018-02-25 DIAGNOSIS — Z87798 Personal history of other (corrected) congenital malformations: Secondary | ICD-10-CM

## 2018-02-25 DIAGNOSIS — O24410 Gestational diabetes mellitus in pregnancy, diet controlled: Secondary | ICD-10-CM

## 2018-02-25 DIAGNOSIS — Z3009 Encounter for other general counseling and advice on contraception: Secondary | ICD-10-CM

## 2018-02-25 DIAGNOSIS — Z8759 Personal history of other complications of pregnancy, childbirth and the puerperium: Secondary | ICD-10-CM

## 2018-02-25 DIAGNOSIS — R768 Other specified abnormal immunological findings in serum: Secondary | ICD-10-CM

## 2018-02-25 DIAGNOSIS — Z362 Encounter for other antenatal screening follow-up: Secondary | ICD-10-CM | POA: Diagnosis not present

## 2018-02-25 DIAGNOSIS — O99213 Obesity complicating pregnancy, third trimester: Secondary | ICD-10-CM

## 2018-02-25 DIAGNOSIS — O09293 Supervision of pregnancy with other poor reproductive or obstetric history, third trimester: Secondary | ICD-10-CM

## 2018-02-25 DIAGNOSIS — E063 Autoimmune thyroiditis: Secondary | ICD-10-CM

## 2018-02-25 DIAGNOSIS — O352XX0 Maternal care for (suspected) hereditary disease in fetus, not applicable or unspecified: Secondary | ICD-10-CM

## 2018-02-25 DIAGNOSIS — O283 Abnormal ultrasonic finding on antenatal screening of mother: Secondary | ICD-10-CM | POA: Diagnosis not present

## 2018-02-25 DIAGNOSIS — O0993 Supervision of high risk pregnancy, unspecified, third trimester: Secondary | ICD-10-CM

## 2018-02-25 DIAGNOSIS — O0990 Supervision of high risk pregnancy, unspecified, unspecified trimester: Secondary | ICD-10-CM

## 2018-02-25 DIAGNOSIS — Z87898 Personal history of other specified conditions: Secondary | ICD-10-CM

## 2018-02-25 DIAGNOSIS — O24419 Gestational diabetes mellitus in pregnancy, unspecified control: Secondary | ICD-10-CM | POA: Diagnosis present

## 2018-02-25 NOTE — Progress Notes (Signed)
   PRENATAL VISIT NOTE  Subjective:  Alyssa Hartman is a 23 y.o. G3P2002 at 66w0dbeing seen today for ongoing prenatal care.  She is currently monitored for the following issues for this high-risk pregnancy and has Supervision of high risk pregnancy, antepartum; Bipolar 1 disorder (HEpes; Hashimoto's thyroiditis; Obesity; Biological false positive RPR test; History of genetic disease; History of prior pregnancy with IUGR newborn; Unwanted fertility; and Gestational diabetes on their problem list.  Patient reports no complaints.  Contractions: Not present. Vag. Bleeding: None.  Movement: Present. Denies leaking of fluid.   The following portions of the patient's history were reviewed and updated as appropriate: allergies, current medications, past family history, past medical history, past social history, past surgical history and problem list. Problem list updated.  Objective:   Vitals:   02/25/18 1524  BP: 133/83  Pulse: (!) 101  Weight: 263 lb (119.3 kg)    Fetal Status: Fetal Heart Rate (bpm): 143   Movement: Present     General:  Alert, oriented and cooperative. Patient is in no acute distress.  Skin: Skin is warm and dry. No rash noted.   Cardiovascular: Normal heart rate noted  Respiratory: Normal respiratory effort, no problems with respiration noted  Abdomen: Soft, gravid, appropriate for gestational age.  Pain/Pressure: Present     Pelvic: Cervical exam deferred        Extremities: Normal range of motion.  Edema: None  Mental Status: Normal mood and affect. Normal behavior. Normal judgment and thought content.   Assessment and Plan:  Pregnancy: G3P2002 at 361w0d1. Supervision of high risk pregnancy, antepartum  2. Diet controlled gestational diabetes mellitus (GDM) in first trimester - has not been checking CBG, states she does not have the needles - States she is following diabetic diet - has normal growth, reactive NST with BPP 8/10 today - has been recommended  for delivery at 37 weeks by MFM if unknown control of CBG - given new kit today to check CBG, reviewed importance of checking CBG and to present to hospital with any issues - set up for weekly BPP/NST  3. Hashimoto's thyroiditis Normal labs 02/15/18  4. History of genetic disease Has had genetic counseling  5. Biological false positive RPR test  6. Unwanted fertility For BTL  7. History of prior pregnancy with IUGR newborn  Preterm labor symptoms and general obstetric precautions including but not limited to vaginal bleeding, contractions, leaking of fluid and fetal movement were reviewed in detail with the patient. Please refer to After Visit Summary for other counseling recommendations.  Return in about 1 week (around 03/04/2018) for OB visit (MD).  Future Appointments  Date Time Provider [place]1/10/2018 10:45 AM [facility] [place] [facility]    KeSloan LeiterMD

## 2018-02-25 NOTE — Progress Notes (Signed)
Pt states she has not been able to check sugars due to cost of supplies. $7.00

## 2018-02-25 NOTE — Procedures (Signed)
Alyssa Hartman 09/27/1995 154w0d  Fetus A Non-Stress Test Interpretation for 02/25/18  Indication: Unsatisfactory BPP  Fetal Heart Rate A Mode: External Baseline Rate (A): 135 bpm Variability: Moderate Accelerations: 15 x 15 Decelerations: None Multiple birth?: No  Uterine Activity Mode: Toco Contraction Frequency (min): none noted  Interpretation (Fetal Testing) Nonstress Test Interpretation: Reactive Comments: FHR tracing rev'd by Dr. Judeth CornfieldShankar

## 2018-02-26 ENCOUNTER — Other Ambulatory Visit (HOSPITAL_COMMUNITY): Payer: Self-pay | Admitting: *Deleted

## 2018-02-26 DIAGNOSIS — O24410 Gestational diabetes mellitus in pregnancy, diet controlled: Secondary | ICD-10-CM

## 2018-03-03 ENCOUNTER — Ambulatory Visit: Payer: Medicaid Other

## 2018-03-04 ENCOUNTER — Ambulatory Visit (HOSPITAL_COMMUNITY)
Admission: RE | Admit: 2018-03-04 | Discharge: 2018-03-04 | Disposition: A | Discharge status: Home / Self Care | Payer: Medicaid Other | Source: Ambulatory Visit | Attending: Nurse Practitioner | Admitting: Nurse Practitioner

## 2018-03-04 ENCOUNTER — Encounter (HOSPITAL_COMMUNITY): Payer: Self-pay

## 2018-03-04 DIAGNOSIS — O24410 Gestational diabetes mellitus in pregnancy, diet controlled: Secondary | ICD-10-CM | POA: Diagnosis present

## 2018-03-04 NOTE — Procedures (Signed)
Alyssa Hartman 1996/02/06 [redacted]w[redacted]d  Fetus A Non-Stress Test Interpretation for 03/04/18  Indication: Diabetes  Fetal Heart Rate A Mode: External Baseline Rate (A): 135 bpm Variability: Moderate Accelerations: 15 x 15 Decelerations: None Multiple birth?: No  Uterine Activity Mode: Palpation, Toco Contraction Frequency (min): U/I Contraction Duration (sec): 30-40 Contraction Quality: Mild Resting Tone Palpated: Relaxed Resting Time: Adequate  Interpretation (Fetal Testing) Nonstress Test Interpretation: Reactive Overall Impression: Reassuring for gestational age Comments: FHR tracing reviewed with Dr. Judeth Cornfield

## 2018-03-05 ENCOUNTER — Ambulatory Visit (INDEPENDENT_AMBULATORY_CARE_PROVIDER_SITE_OTHER): Payer: Medicaid Other | Admitting: Obstetrics & Gynecology

## 2018-03-05 ENCOUNTER — Encounter: Payer: Self-pay | Admitting: Obstetrics & Gynecology

## 2018-03-05 VITALS — BP 104/70 | HR 106 | Wt 258.0 lb

## 2018-03-05 DIAGNOSIS — O24410 Gestational diabetes mellitus in pregnancy, diet controlled: Secondary | ICD-10-CM

## 2018-03-05 DIAGNOSIS — O0993 Supervision of high risk pregnancy, unspecified, third trimester: Secondary | ICD-10-CM

## 2018-03-05 DIAGNOSIS — Z3A35 35 weeks gestation of pregnancy: Secondary | ICD-10-CM

## 2018-03-05 DIAGNOSIS — O0990 Supervision of high risk pregnancy, unspecified, unspecified trimester: Secondary | ICD-10-CM

## 2018-03-05 NOTE — Patient Instructions (Signed)

## 2018-03-05 NOTE — Progress Notes (Signed)
   PRENATAL VISIT NOTE  Subjective:  Alyssa Hartman is a 23 y.o. G3P2002 at [redacted]w[redacted]d being seen today for ongoing prenatal care.  She is currently monitored for the following issues for this high-risk pregnancy and has Supervision of high risk pregnancy, antepartum; Bipolar 1 disorder (HCC); Hashimoto's thyroiditis; Obesity; Biological false positive RPR test; History of genetic disease; History of prior pregnancy with IUGR newborn; Unwanted fertility; and Gestational diabetes on their problem list.  Patient reports no complaints.  Contractions: Irregular. Vag. Bleeding: None.  Movement: Present. Denies leaking of fluid.   The following portions of the patient's history were reviewed and updated as appropriate: allergies, current medications, past family history, past medical history, past social history, past surgical history and problem list. Problem list updated.  Objective:   Vitals:   03/05/18 1027  BP: 104/70  Pulse: (!) 106  Weight: 258 lb (117 kg)    Fetal Status: Fetal Heart Rate (bpm): 138   Movement: Present     General:  Alert, oriented and cooperative. Patient is in no acute distress.  Skin: Skin is warm and dry. No rash noted.   Cardiovascular: Normal heart rate noted  Respiratory: Normal respiratory effort, no problems with respiration noted  Abdomen: Soft, gravid, appropriate for gestational age.  Pain/Pressure: Present     Pelvic: Cervical exam deferred        Extremities: Normal range of motion.  Edema: None  Mental Status: Normal mood and affect. Normal behavior. Normal judgment and thought content.   Assessment and Plan:  Pregnancy: G3P2002 at [redacted]w[redacted]d  1. Supervision of high risk pregnancy, antepartum Normal growth [place] last week  2. Diet controlled gestational diabetes mellitus (GDM) in third trimester Good diet control, testing reviewed  Preterm labor symptoms and general obstetric precautions including but not limited to vaginal bleeding, contractions,  leaking of fluid and fetal movement were reviewed in detail with the patient. Please refer to After Visit Summary for other counseling recommendations.  Return in about 1 week (around 03/12/2018).  Future Appointments  Date Time Provider Department Center  03/05/2018 10:45 AM Adam Phenix, MD [facility] None  03/11/2018  9:45 AM [facility] [place] 2 [facility]  03/11/2018 10:45 AM [facility] NST [facility]  03/18/2018  9:45 AM [facility] NST [facility]  03/18/2018 10:45 AM [facility] [place] 5 [facility]    Scheryl Darter, MD

## 2018-03-11 ENCOUNTER — Ambulatory Visit (HOSPITAL_COMMUNITY)
Admission: RE | Admit: 2018-03-11 | Discharge: 2018-03-11 | Disposition: A | Discharge status: Home / Self Care | Payer: Medicaid Other | Source: Ambulatory Visit | Attending: Nurse Practitioner | Admitting: Nurse Practitioner

## 2018-03-11 ENCOUNTER — Encounter (HOSPITAL_COMMUNITY): Payer: Self-pay

## 2018-03-11 DIAGNOSIS — O09292 Supervision of pregnancy with other poor reproductive or obstetric history, second trimester: Secondary | ICD-10-CM | POA: Diagnosis not present

## 2018-03-11 DIAGNOSIS — O352XX0 Maternal care for (suspected) hereditary disease in fetus, not applicable or unspecified: Secondary | ICD-10-CM

## 2018-03-11 DIAGNOSIS — Z3A36 36 weeks gestation of pregnancy: Secondary | ICD-10-CM

## 2018-03-11 DIAGNOSIS — O24410 Gestational diabetes mellitus in pregnancy, diet controlled: Secondary | ICD-10-CM | POA: Diagnosis present

## 2018-03-11 DIAGNOSIS — O283 Abnormal ultrasonic finding on antenatal screening of mother: Secondary | ICD-10-CM | POA: Diagnosis not present

## 2018-03-11 DIAGNOSIS — O99213 Obesity complicating pregnancy, third trimester: Secondary | ICD-10-CM

## 2018-03-11 DIAGNOSIS — Z362 Encounter for other antenatal screening follow-up: Secondary | ICD-10-CM

## 2018-03-11 NOTE — Procedures (Signed)
Alyssa Hartman 1995-09-17 [redacted]w[redacted]d  Fetus A Non-Stress Test Interpretation for 03/11/18  Indication: Diabetes  Fetal Heart Rate A Mode: External Baseline Rate (A): 140 bpm Variability: Moderate Accelerations: 15 x 15 Decelerations: Variable Multiple birth?: No  Uterine Activity Mode: Palpation, Toco Contraction Frequency (min): Erratic UI Contraction Duration (sec): 10-30 Contraction Quality: Mild Resting Tone Palpated: Relaxed Resting Time: Adequate  Interpretation (Fetal Testing) Nonstress Test Interpretation: Reactive Overall Impression: Reassuring for gestational age Comments: EFM tracing reviewed by Dr. Judeth Cornfield

## 2018-03-12 ENCOUNTER — Ambulatory Visit (INDEPENDENT_AMBULATORY_CARE_PROVIDER_SITE_OTHER): Payer: Medicaid Other | Admitting: Obstetrics & Gynecology

## 2018-03-12 ENCOUNTER — Other Ambulatory Visit (HOSPITAL_COMMUNITY)
Admission: RE | Admit: 2018-03-12 | Discharge: 2018-03-12 | Disposition: A | Discharge status: Home / Self Care | Payer: Medicaid Other | Source: Ambulatory Visit | Attending: Obstetrics & Gynecology | Admitting: Obstetrics & Gynecology

## 2018-03-12 VITALS — BP 100/66 | HR 88 | Wt 259.0 lb

## 2018-03-12 DIAGNOSIS — Z3009 Encounter for other general counseling and advice on contraception: Secondary | ICD-10-CM

## 2018-03-12 DIAGNOSIS — O24410 Gestational diabetes mellitus in pregnancy, diet controlled: Secondary | ICD-10-CM | POA: Diagnosis not present

## 2018-03-12 DIAGNOSIS — O0993 Supervision of high risk pregnancy, unspecified, third trimester: Secondary | ICD-10-CM

## 2018-03-12 DIAGNOSIS — E063 Autoimmune thyroiditis: Secondary | ICD-10-CM

## 2018-03-12 DIAGNOSIS — O0990 Supervision of high risk pregnancy, unspecified, unspecified trimester: Secondary | ICD-10-CM | POA: Insufficient documentation

## 2018-03-12 DIAGNOSIS — Z3A36 36 weeks gestation of pregnancy: Secondary | ICD-10-CM

## 2018-03-12 DIAGNOSIS — Z8759 Personal history of other complications of pregnancy, childbirth and the puerperium: Secondary | ICD-10-CM

## 2018-03-12 LAB — POCT CBG (FASTING - GLUCOSE)-MANUAL ENTRY: Glucose Fasting, POC: 80 mg/dL (ref 70–99)

## 2018-03-12 LAB — OB RESULTS CONSOLE GBS: GBS: POSITIVE

## 2018-03-12 NOTE — Progress Notes (Signed)
Having pelvic pressure, leg cramps.  Has not been checking sugars because she did not have another glucose log.  Having heatburn.

## 2018-03-12 NOTE — Progress Notes (Signed)
   PRENATAL VISIT NOTE  Subjective:  Alyssa Hartman is a 23 y.o. G3P2002 at [redacted]w[redacted]d being seen today for ongoing prenatal care.  She is currently monitored for the following issues for this high-risk pregnancy and has Supervision of high risk pregnancy, antepartum; Bipolar 1 disorder (HCC); Hashimoto's thyroiditis; Obesity; Biological false positive RPR test; History of genetic disease; History of prior pregnancy with IUGR newborn; Unwanted fertility; and Gestational diabetes on their problem list.  Patient reports no complaints.  Contractions: Irregular.  .  Movement: Present. Denies leaking of fluid.   The following portions of the patient's history were reviewed and updated as appropriate: allergies, current medications, past family history, past medical history, past social history, past surgical history and problem list. Problem list updated.  Objective:   Vitals:   03/12/18 0943  BP: 100/66  Pulse: 88  Weight: 259 lb (117.5 kg)    Fetal Status: Fetal Heart Rate (bpm): 150   Movement: Present     General:  Alert, oriented and cooperative. Patient is in no acute distress.  Skin: Skin is warm and dry. No rash noted.   Cardiovascular: Normal heart rate noted  Respiratory: Normal respiratory effort, no problems with respiration noted  Abdomen: Soft, gravid, appropriate for gestational age.  Pain/Pressure: Present     Pelvic: Cervical exam deferred        Extremities: Normal range of motion.  Edema: Trace  Mental Status: Normal mood and affect. Normal behavior. Normal judgment and thought content.  RBS today here is 80  Assessment and Plan:  Pregnancy: G3P2002 at [redacted]w[redacted]d  1. Unwanted fertility - BTL papers signed 02/01/18  2. History of prior pregnancy with IUGR newborn - weekly BPP with MFM  3. Supervision of high risk pregnancy, antepartum - cervical cultures today  4. Diet controlled gestational diabetes mellitus (GDM) in third trimester - she is essentially not checking  her sugars. I have encouraged her to do so. Dr. Judeth Cornfield recommends that she be delivered around [redacted] weeks eGA  5. Hashimoto's thyroiditis - recent TSH normal  Preterm labor symptoms and general obstetric precautions including but not limited to vaginal bleeding, contractions, leaking of fluid and fetal movement were reviewed in detail with the patient. Please refer to After Visit Summary for other counseling recommendations.  No follow-ups on file.  Future Appointments  Date Time Provider Department Center  03/18/2018  9:45 AM [facility] NST [facility]  03/18/2018 10:45 AM [facility] [place] 5 [facility]    Allie Bossier, MD

## 2018-03-13 LAB — HEMOGLOBIN A1C
Est. average glucose Bld gHb Est-mCnc: 117 mg/dL
Hgb A1c MFr Bld: 5.7 % — ABNORMAL HIGH (ref 4.8–5.6)

## 2018-03-15 LAB — CERVICOVAGINAL ANCILLARY ONLY
Chlamydia: NEGATIVE
Neisseria Gonorrhea: NEGATIVE

## 2018-03-15 LAB — CULTURE, BETA STREP (GROUP B ONLY): Strep Gp B Culture: POSITIVE — AB

## 2018-03-16 ENCOUNTER — Encounter: Payer: Self-pay | Admitting: Obstetrics & Gynecology

## 2018-03-16 DIAGNOSIS — O99820 Streptococcus B carrier state complicating pregnancy: Secondary | ICD-10-CM

## 2018-03-16 HISTORY — DX: Streptococcus B carrier state complicating pregnancy: O99.820

## 2018-03-18 ENCOUNTER — Encounter (HOSPITAL_COMMUNITY): Payer: Self-pay | Admitting: *Deleted

## 2018-03-18 ENCOUNTER — Inpatient Hospital Stay (HOSPITAL_COMMUNITY)
Admission: AD | Admit: 2018-03-18 | Discharge: 2018-03-21 | DRG: 805 | Disposition: A | Discharge status: Home / Self Care | Payer: Medicaid Other | Attending: Obstetrics and Gynecology | Admitting: Obstetrics and Gynecology

## 2018-03-18 ENCOUNTER — Encounter (HOSPITAL_COMMUNITY): Payer: Self-pay | Admitting: Anesthesiology

## 2018-03-18 ENCOUNTER — Encounter (HOSPITAL_COMMUNITY): Payer: Self-pay

## 2018-03-18 ENCOUNTER — Ambulatory Visit (HOSPITAL_BASED_OUTPATIENT_CLINIC_OR_DEPARTMENT_OTHER)
Admission: RE | Admit: 2018-03-18 | Discharge: 2018-03-18 | Disposition: A | Discharge status: Home / Self Care | Payer: Medicaid Other | Source: Ambulatory Visit | Attending: Nurse Practitioner | Admitting: Nurse Practitioner

## 2018-03-18 ENCOUNTER — Ambulatory Visit (HOSPITAL_COMMUNITY)
Admission: RE | Admit: 2018-03-18 | Discharge: 2018-03-18 | Disposition: A | Discharge status: Home / Self Care | Payer: Medicaid Other | Source: Ambulatory Visit | Attending: Nurse Practitioner | Admitting: Nurse Practitioner

## 2018-03-18 ENCOUNTER — Other Ambulatory Visit (HOSPITAL_COMMUNITY): Payer: Self-pay | Admitting: Obstetrics and Gynecology

## 2018-03-18 ENCOUNTER — Other Ambulatory Visit: Payer: Self-pay

## 2018-03-18 VITALS — BP 95/55 | HR 102 | Wt 262.6 lb

## 2018-03-18 DIAGNOSIS — O2693 Pregnancy related conditions, unspecified, third trimester: Secondary | ICD-10-CM | POA: Diagnosis not present

## 2018-03-18 DIAGNOSIS — O24410 Gestational diabetes mellitus in pregnancy, diet controlled: Secondary | ICD-10-CM

## 2018-03-18 DIAGNOSIS — Z3490 Encounter for supervision of normal pregnancy, unspecified, unspecified trimester: Secondary | ICD-10-CM

## 2018-03-18 DIAGNOSIS — O99820 Streptococcus B carrier state complicating pregnancy: Secondary | ICD-10-CM

## 2018-03-18 DIAGNOSIS — O24420 Gestational diabetes mellitus in childbirth, diet controlled: Secondary | ICD-10-CM | POA: Diagnosis present

## 2018-03-18 DIAGNOSIS — Z87891 Personal history of nicotine dependence: Secondary | ICD-10-CM | POA: Diagnosis not present

## 2018-03-18 DIAGNOSIS — Z3A37 37 weeks gestation of pregnancy: Secondary | ICD-10-CM

## 2018-03-18 DIAGNOSIS — Z362 Encounter for other antenatal screening follow-up: Secondary | ICD-10-CM

## 2018-03-18 DIAGNOSIS — O99214 Obesity complicating childbirth: Secondary | ICD-10-CM | POA: Diagnosis present

## 2018-03-18 DIAGNOSIS — O99213 Obesity complicating pregnancy, third trimester: Secondary | ICD-10-CM

## 2018-03-18 DIAGNOSIS — Z3009 Encounter for other general counseling and advice on contraception: Secondary | ICD-10-CM

## 2018-03-18 DIAGNOSIS — O24415 Gestational diabetes mellitus in pregnancy, controlled by oral hypoglycemic drugs: Secondary | ICD-10-CM

## 2018-03-18 DIAGNOSIS — O403XX0 Polyhydramnios, third trimester, not applicable or unspecified: Secondary | ICD-10-CM | POA: Diagnosis present

## 2018-03-18 DIAGNOSIS — O411230 Chorioamnionitis, third trimester, not applicable or unspecified: Secondary | ICD-10-CM | POA: Diagnosis present

## 2018-03-18 DIAGNOSIS — O99824 Streptococcus B carrier state complicating childbirth: Secondary | ICD-10-CM | POA: Diagnosis present

## 2018-03-18 DIAGNOSIS — O352XX0 Maternal care for (suspected) hereditary disease in fetus, not applicable or unspecified: Secondary | ICD-10-CM

## 2018-03-18 LAB — CBC
HCT: 38.1 % (ref 36.0–46.0)
Hemoglobin: 12.3 g/dL (ref 12.0–15.0)
MCH: 26.1 pg (ref 26.0–34.0)
MCHC: 32.3 g/dL (ref 30.0–36.0)
MCV: 80.9 fL (ref 80.0–100.0)
Platelets: 197 10*3/uL (ref 150–400)
RBC: 4.71 MIL/uL (ref 3.87–5.11)
RDW: 15.8 % — ABNORMAL HIGH (ref 11.5–15.5)
WBC: 8.3 10*3/uL (ref 4.0–10.5)
nRBC: 0 % (ref 0.0–0.2)

## 2018-03-18 LAB — TYPE AND SCREEN
ABO/RH(D): A POS
Antibody Screen: NEGATIVE

## 2018-03-18 LAB — GLUCOSE, CAPILLARY
Glucose-Capillary: 72 mg/dL (ref 70–99)
Glucose-Capillary: 121 mg/dL — ABNORMAL HIGH (ref 70–99)
Glucose-Capillary: 103 mg/dL — ABNORMAL HIGH (ref 70–99)

## 2018-03-18 MED ORDER — ACETAMINOPHEN 325 MG PO TABS: 650.0000 mg | ORAL_TABLET | ORAL | Status: DC | PRN

## 2018-03-18 MED ORDER — LIDOCAINE HCL (PF) 1 % IJ SOLN
30.0000 mL | INTRAMUSCULAR | Status: DC | PRN
  Filled 2018-03-18: qty 30

## 2018-03-18 MED ORDER — MISOPROSTOL 50MCG HALF TABLET
50.0000 ug | ORAL_TABLET | ORAL | Status: DC
  Administered 2018-03-19 (×2): 50 ug via ORAL
  Filled 2018-03-18 (×7): qty 1

## 2018-03-18 MED ORDER — LACTATED RINGERS IV SOLN: 500.0000 mL | INTRAVENOUS | Status: DC | PRN

## 2018-03-18 MED ORDER — TERBUTALINE SULFATE 1 MG/ML IJ SOLN
0.2500 mg | Freq: Once | INTRAMUSCULAR | Status: DC | PRN
  Filled 2018-03-18: qty 1

## 2018-03-18 MED ORDER — SODIUM CHLORIDE 0.9 % IV SOLN
5.0000 10*6.[IU] | Freq: Once | INTRAVENOUS | Status: AC
  Administered 2018-03-18: 5 10*6.[IU] via INTRAVENOUS
  Filled 2018-03-18: qty 5

## 2018-03-18 MED ORDER — ONDANSETRON HCL 4 MG/2ML IJ SOLN
4.0000 mg | Freq: Four times a day (QID) | INTRAMUSCULAR | Status: DC | PRN
  Administered 2018-03-19: 4 mg via INTRAVENOUS
  Filled 2018-03-18: qty 2

## 2018-03-18 MED ORDER — LACTATED RINGERS IV SOLN
INTRAVENOUS | Status: DC
  Administered 2018-03-18 – 2018-03-19 (×4): via INTRAVENOUS

## 2018-03-18 MED ORDER — PENICILLIN G 3 MILLION UNITS IVPB - SIMPLE MED
3.0000 10*6.[IU] | INTRAVENOUS | Status: DC
  Administered 2018-03-18 – 2018-03-19 (×4): 3 10*6.[IU] via INTRAVENOUS
  Filled 2018-03-18 (×3): qty 100

## 2018-03-18 MED ORDER — OXYTOCIN BOLUS FROM INFUSION
500.0000 mL | Freq: Once | INTRAVENOUS | Status: AC
  Administered 2018-03-19: 500 mL via INTRAVENOUS

## 2018-03-18 MED ORDER — FENTANYL CITRATE (PF) 100 MCG/2ML IJ SOLN
INTRAMUSCULAR | Status: AC
  Filled 2018-03-18: qty 2

## 2018-03-18 MED ORDER — MISOPROSTOL 25 MCG QUARTER TABLET
25.0000 ug | ORAL_TABLET | ORAL | Status: DC | PRN
  Administered 2018-03-18 (×2): 25 ug via VAGINAL
  Filled 2018-03-18 (×3): qty 1

## 2018-03-18 MED ORDER — OXYCODONE-ACETAMINOPHEN 5-325 MG PO TABS: 1.0000 | ORAL_TABLET | ORAL | Status: DC | PRN

## 2018-03-18 MED ORDER — OXYCODONE-ACETAMINOPHEN 5-325 MG PO TABS: 2.0000 | ORAL_TABLET | ORAL | Status: DC | PRN

## 2018-03-18 MED ORDER — FENTANYL CITRATE (PF) 100 MCG/2ML IJ SOLN
100.0000 ug | INTRAMUSCULAR | Status: DC | PRN
  Administered 2018-03-19: 100 ug via INTRAVENOUS

## 2018-03-18 MED ORDER — SOD CITRATE-CITRIC ACID 500-334 MG/5ML PO SOLN
30.0000 mL | ORAL | Status: DC | PRN
  Administered 2018-03-19: 30 mL via ORAL
  Filled 2018-03-18: qty 15

## 2018-03-18 MED ORDER — OXYTOCIN 40 UNITS IN NORMAL SALINE INFUSION - SIMPLE MED
2.5000 [IU]/h | INTRAVENOUS | Status: DC
  Filled 2018-03-18: qty 1000

## 2018-03-18 NOTE — MAU Note (Signed)
Report called to [facility], RN in [facility].  Pt ambulated to admitting for Room 163.

## 2018-03-18 NOTE — ED Notes (Signed)
Pt not checking blood sugars.

## 2018-03-18 NOTE — Discharge Summary (Addendum)
Obstetrics Discharge Summary OB/GYN Faculty Practice   Patient Name: Alyssa Hartman DOB: 28-Sep-1995 MRN: [id number]  Date of admission: 03/18/2018 Delivering MD: Arvilla Market   Date of discharge: 03/21/2018  Admitting diagnosis: poly and uncontrolled diabetes Intrauterine pregnancy: [redacted]w[redacted]d     Secondary diagnosis:   Active Problems:   Pregnancy  Discharge diagnosis: Term Pregnancy Delivered                                 Postpartum procedures: None  Complications: Shoulder dystocia  Outpatient Follow-Up: [ ]  counsel on birth control (BTL consent signed 12/9 but declined postpartum)  Hospital course: Alyssa Hartman is a 23 y.o. [redacted]w[redacted]d who was admitted for induction status post polyhydramnios and uncontrolled A1GDM. Her pregnancy was complicated by A1GDM.  Delivery was complicated by shoulder dystocia lasting less than 30 seconds. Please see delivery/op note for additional details. Her postpartum course was uncomplicated. She is breastfeeding and formula feeding. By day of discharge, she was passing flatus, urinating, eating and drinking without difficulty. Her pain was well-controlled, and she was discharged home with ibuprofen. She will follow-up in clinic in 4-6 weeks.   Physical exam  Vitals:   03/20/18 0501 03/20/18 0830 03/20/18 1946 03/21/18 0403  BP: 106/66 103/65 (!) 97/54 (!) 102/52  Pulse: 93 92 88 72  Resp: 18 18 18 18   Temp: 98.3 F (36.8 C) 97.7 F (36.5 C) 97.9 F (36.6 C) (!) 97.5 F (36.4 C)  TempSrc: Oral Oral Oral Oral  SpO2:      Weight:      Height:       General: alert and cooperative Lochia: appropriate Uterine Fundus: firm Incision: N/A DVT Evaluation: No evidence of DVT seen on physical exam. Labs: Lab Results  Component Value Date   WBC 8.3 03/18/2018   HGB 12.3 03/18/2018   HCT 38.1 03/18/2018   MCV 80.9 03/18/2018   PLT 197 03/18/2018   CMP Latest Ref Rng & Units 05/08/2017  Glucose 65 - 99 mg/dL 725(D)  BUN 6 - 20 mg/dL  17  Creatinine 6.64 - 1.00 mg/dL 4.03  Sodium 474 - 259 mmol/L 141  Potassium 3.5 - 5.1 mmol/L 4.4  Chloride 101 - 111 mmol/L 107  CO2 22 - 32 mmol/L 24  Calcium 8.9 - 10.3 mg/dL 8.9  Total Protein 6.5 - 8.1 g/dL 6.7  Total Bilirubin 0.3 - 1.2 mg/dL 0.9  Alkaline Phos 38 - 126 U/L 83  AST 15 - 41 U/L 32  ALT 14 - 54 U/L 29    Discharge instructions: Per After Visit Summary and "Baby and Me Booklet"  After visit meds:  Allergies as of 03/21/2018   No Known Allergies     Medication List    STOP taking these medications   ACCU-CHEK FASTCLIX LANCETS Misc   COMFORT FIT MATERNITY SUPP LG Misc   glucose blood test strip Commonly known as:  ACCU-CHEK GUIDE     TAKE these medications   acetaminophen 325 MG tablet Commonly known as:  TYLENOL Take 2 tablets (650 mg total) by mouth every 4 (four) hours as needed (breakthrough pain).   calcium carbonate 500 MG chewable tablet Commonly known as:  TUMS - dosed in mg elemental calcium Chew 2 tablets by mouth at bedtime as needed for indigestion or heartburn.   ibuprofen 800 MG tablet Commonly known as:  ADVIL,MOTRIN Take 1 tablet (800 mg total) by mouth 3 (three)  times daily.   prenatal multivitamin Tabs tablet Take 1 tablet by mouth daily at 12 noon.   senna-docusate 8.6-50 MG tablet Commonly known as:  Senokot-S Take 2 tablets by mouth daily. Start taking on:  March 22, 2018       Postpartum contraception: undecided - declined postpartum BTL (consent signed 02/02/19) Diet: Routine Diet Activity: Advance as tolerated. Pelvic rest for 6 weeks.   Follow-up Appt: message sent to schedule   Newborn Data: Live born infant  Birth Weight: 3080g APGAR: 5, 8   Baby Feeding: Bottle and Breast Disposition: currently in NICU

## 2018-03-18 NOTE — Progress Notes (Signed)
LABOR PROGRESS NOTE  Alyssa Hartman is a 23 y.o. G3P2002 at [redacted]w[redacted]d with A1GDM admitted for IOL for polyhydramnios.  Subjective: Patient resting comfortably in bed feeling her occasional contractions.She denies vaginal bleeding. She reports fetal movement. Blood sugar this check is 121.  Objective: BP (!) 123/51   Pulse 99   Temp 98.9 F (37.2 C) (Oral)   Resp 18   Ht 5\' 2"  (1.575 m)   Wt 117.9 kg   LMP 07/02/2017 (Exact Date)   BMI 47.54 kg/m  or  Vitals:   03/18/18 1250 03/18/18 1311 03/18/18 1838 03/18/18 1839  BP:  117/61  (!) 123/51  Pulse:  100  99  Resp:  18 18   Temp:  98.4 F (36.9 C) 98.9 F (37.2 C)   TempSrc:  Oral Oral   Weight: 117.9 kg     Height: 5\' 2"  (1.575 m)       Dilation: Fingertip Effacement (%): Thick Station: -3 Presentation: Vertex Exam by:: dr Dareen Piano, Alyssa Hartman FHT: baseline rate 150, moderate varibility, 15x15 acel, no decel Toco: cytotec x 2  Labs: Lab Results  Component Value Date   WBC 8.3 03/18/2018   HGB 12.3 03/18/2018   HCT 38.1 03/18/2018   MCV 80.9 03/18/2018   PLT 197 03/18/2018    Patient Active Problem List   Diagnosis Date Noted  . Pregnancy 03/18/2018  . GBS (group B Streptococcus carrier), +RV culture, currently pregnant 03/16/2018  . Gestational diabetes 02/03/2018  . Unwanted fertility 12/08/2017  . History of genetic disease 10/13/2017  . History of prior pregnancy with IUGR newborn 10/13/2017  . Biological false positive RPR test 12/06/2016  . Supervision of high risk pregnancy, antepartum 07/28/2016  . Hashimoto's thyroiditis 10/08/2012  . Obesity 10/08/2012  . Bipolar 1 disorder (HCC) 06/23/2011    Assessment / Plan: 23 y.o. G3P2002 at [redacted]w[redacted]d with A1GDM here for IOL for polyhydramnios.  Labor: Latent, patient given Cytotec x 2, will consider adding Foley Bulb at next check if there is sightly more dilation. Fetal Wellbeing:  Category 1 Pain Control:  Planning for Epidural Anticipated MOD:   Vaginal  [name], DO [facility], PGY-1 03/18/2018 7:06 PM

## 2018-03-18 NOTE — H&P (Addendum)
LABOR AND DELIVERY ADMISSION HISTORY AND PHYSICAL NOTE  Alyssa Hartman is a 23 y.o. female 413P2002 with IUP at 2363w0d by LMP presenting for IOL for polyhydramnios .  She reports positive fetal movement. She denies leakage of fluid or vaginal bleeding.  Prenatal History/Complications: PNC at [facility] then [facility] Pregnancy complications:  - A1GDM  Past Medical History: Past Medical History:  Diagnosis Date  . Carpal tunnel syndrome   . Hx of Hashimoto thyroiditis   . Hypothyroid     Past Surgical History: Past Surgical History:  Procedure Laterality Date  . NO PAST SURGERIES      Obstetrical History: OB History    Gravida  3   Para  2   Term  2   Preterm  0   AB      Living  2     SAB      TAB      Ectopic      Multiple  0   Live Births  2           Social History: Social History   Socioeconomic History  . Marital status: Single    Spouse name: Not on file  . Number of children: Not on file  . Years of education: Not on file  . Highest education level: Not on file  Occupational History  . Not on file  Social Needs  . Financial resource strain: Not on file  . Food insecurity:    Worry: Not on file    Inability: Not on file  . Transportation needs:    Medical: Not on file    Non-medical: Not on file  Tobacco Use  . Smoking status: Former Smoker    Last attempt to quit: 03/18/2013    Years since quitting: 5.0  . Smokeless tobacco: Never Used  Substance and Sexual Activity  . Alcohol use: No  . Drug use: No  . Sexual activity: Not Currently    Birth control/protection: None  Lifestyle  . Physical activity:    Days per week: Not on file    Minutes per session: Not on file  . Stress: Not on file  Relationships  . Social connections:    Talks on phone: Not on file    Gets together: Not on file    Attends religious service: Not on file    Active member of club or organization: Not on file    Attends meetings of clubs or organizations:  Not on file    Relationship status: Not on file  Other Topics Concern  . Not on file  Social History Narrative  . Not on file    Family History: Family History  Problem Relation Age of Onset  . Diabetes Maternal Grandmother   . Hypertension Maternal Grandmother   - 2 children and husband with BOR Syndrome  Allergies: No Known Allergies  Medications Prior to Admission  Medication Sig Dispense Refill Last Dose  . calcium carbonate (TUMS - DOSED IN MG ELEMENTAL CALCIUM) 500 MG chewable tablet Chew 2 tablets by mouth at bedtime as needed for indigestion or heartburn.   03/16/2018  . Prenatal Vit-Fe Fumarate-FA (PRENATAL MULTIVITAMIN) TABS tablet Take 1 tablet by mouth daily at 12 noon. 30 tablet 30 03/17/2018 at Unknown time  . ACCU-CHEK FASTCLIX LANCETS MISC 1 Units by Percutaneous route 4 (four) times daily. (Patient not taking: Reported on 03/12/2018) 100 each 12 Not Taking  . Elastic Bandages & Supports (COMFORT FIT MATERNITY SUPP LG) MISC 1  Units by Does not apply route as needed. Maternity support belt as needed (Patient not taking: Reported on 03/12/2018) 1 each 0 Not Taking  . glucose blood (ACCU-CHEK GUIDE) test strip Use as instructed (Patient not taking: Reported on 03/12/2018) 100 each 12 Not Taking   Review of Systems  All systems reviewed and negative except as stated in HPI  Physical Exam Blood pressure 117/61, pulse 100, temperature 98.4 F (36.9 C), temperature source Oral, resp. rate 18, height 5\' 2"  (1.575 m), weight 117.9 kg, last menstrual period 07/02/2017, unknown if currently breastfeeding. General appearance: alert, oriented, NAD Lungs: normal respiratory effort Heart: regular rate Abdomen: soft, non-tender; gravid, FH appropriate for GA Extremities: No calf swelling or tenderness Presentation: cephalic Fetal monitoring: baseline 145, minimal variability, 10x10 accelerations, variable decels Uterine activity:  Dilation: Fingertip Effacement (%):  Thick Station: -3 Exam by:: dr Dareen Piano  Prenatal labs: ABO, Rh: A/Positive/-- (07/22 1139) Antibody: Negative (07/22 1139) Rubella: 3.69 (07/22 1139) RPR: Reactive (12/23 1412)  HBsAg: Negative (07/22 1139)  HIV: Non Reactive (12/09 1015)  GC/Chlamydia: Not tested GBS:   positive - treated with pencillin 2-hr GTT: 95/210/104 - A1GDM Genetic screening:  Negative Anatomy US: Face not well visualized, otherwise within normal limits, Female  Prenatal Transfer Tool  Maternal Diabetes: Yes:  Diabetes Type:  Diet controlled A1GDM Genetic Screening: Normal Maternal Ultrasounds/Referrals: Abnormal:  Findings:   Other: Polyhydramnios Fetal Ultrasounds or other Referrals:  None Maternal Substance Abuse:  No Significant Maternal Medications:  None Significant Maternal Lab Results: Lab values include: Group B Strep positive  Results for orders placed or performed during the hospital encounter of 03/18/18 (from the past 24 hour(s))  Glucose, capillary   Collection Time: 03/18/18  2:21 PM  Result Value Ref Range   Glucose-Capillary 72 70 - 99 mg/dL    Patient Active Problem List   Diagnosis Date Noted  . Pregnancy 03/18/2018  . GBS (group B Streptococcus carrier), +RV culture, currently pregnant 03/16/2018  . Gestational diabetes 02/03/2018  . Unwanted fertility 12/08/2017  . History of genetic disease 10/13/2017  . History of prior pregnancy with IUGR newborn 10/13/2017  . Biological false positive RPR test 12/06/2016  . Supervision of high risk pregnancy, antepartum 07/28/2016  . Hashimoto's thyroiditis 10/08/2012  . Obesity 10/08/2012  . Bipolar 1 disorder (HCC) 06/23/2011    Assessment: Alyssa Hartman is a 23 y.o. G3P2002 at [redacted]w[redacted]d here for IOL for polyhydramnios, uncontrolled A1GDM.  #Labor: latent #Pain: Epidural #FWB: Cat 2 #ID:  GBS positive - receiving PCN #MOF: Both #MOC: BTL (papers signed Dec 2019) #Circ:  N/A, female  Alyssa Hartman 03/18/2018, 2:43 PM    I confirm that I have verified the information documented in the resident's note and that I have also personally reperformed the physical exam and all medical decision making activities.  Fetal tracing Category I prior to and at time of Cytotec placement Brief interval of Category II at time of resident's signature  Clayton Bibles, CNM 03/18/18  3:10 PM

## 2018-03-19 ENCOUNTER — Encounter: Payer: Medicaid Other | Admitting: Family Medicine

## 2018-03-19 ENCOUNTER — Inpatient Hospital Stay (HOSPITAL_COMMUNITY): Payer: Medicaid Other | Admitting: Anesthesiology

## 2018-03-19 ENCOUNTER — Encounter: Payer: Medicaid Other | Admitting: Medical

## 2018-03-19 ENCOUNTER — Encounter (HOSPITAL_COMMUNITY): Payer: Self-pay | Admitting: *Deleted

## 2018-03-19 DIAGNOSIS — Z3A37 37 weeks gestation of pregnancy: Secondary | ICD-10-CM

## 2018-03-19 DIAGNOSIS — O99824 Streptococcus B carrier state complicating childbirth: Secondary | ICD-10-CM

## 2018-03-19 DIAGNOSIS — O403XX0 Polyhydramnios, third trimester, not applicable or unspecified: Secondary | ICD-10-CM

## 2018-03-19 DIAGNOSIS — O24420 Gestational diabetes mellitus in childbirth, diet controlled: Secondary | ICD-10-CM

## 2018-03-19 LAB — RPR: RPR Ser Ql: REACTIVE — AB

## 2018-03-19 LAB — GLUCOSE, CAPILLARY
Glucose-Capillary: 96 mg/dL (ref 70–99)
Glucose-Capillary: 96 mg/dL (ref 70–99)
Glucose-Capillary: 68 mg/dL — ABNORMAL LOW (ref 70–99)

## 2018-03-19 LAB — RPR, QUANT+TP ABS (REFLEX)
Rapid Plasma Reagin, Quant: 1:2 {titer} — ABNORMAL HIGH
T Pallidum Abs: NONREACTIVE

## 2018-03-19 MED ORDER — ACETAMINOPHEN 500 MG PO TABS
1000.0000 mg | ORAL_TABLET | Freq: Four times a day (QID) | ORAL | Status: DC | PRN
  Administered 2018-03-19: 1000 mg via ORAL
  Filled 2018-03-19: qty 2

## 2018-03-19 MED ORDER — EPHEDRINE 5 MG/ML INJ
10.0000 mg | INTRAVENOUS | Status: DC | PRN
  Filled 2018-03-19: qty 2

## 2018-03-19 MED ORDER — LIDOCAINE-EPINEPHRINE (PF) 2 %-1:200000 IJ SOLN
INTRAMUSCULAR | Status: DC | PRN
  Administered 2018-03-19: 4 mL via EPIDURAL
  Administered 2018-03-19: 3 mL via EPIDURAL

## 2018-03-19 MED ORDER — WITCH HAZEL-GLYCERIN EX PADS: 1.0000 "application " | MEDICATED_PAD | CUTANEOUS | Status: DC | PRN

## 2018-03-19 MED ORDER — COCONUT OIL OIL: 1.0000 "application " | TOPICAL_OIL | Status: DC | PRN

## 2018-03-19 MED ORDER — OXYCODONE HCL 5 MG PO TABS: 5.0000 mg | ORAL_TABLET | ORAL | Status: DC | PRN

## 2018-03-19 MED ORDER — ZOLPIDEM TARTRATE 5 MG PO TABS: 5.0000 mg | ORAL_TABLET | Freq: Every evening | ORAL | Status: DC | PRN

## 2018-03-19 MED ORDER — LACTATED RINGERS IV BOLUS
2000.0000 mL | Freq: Once | INTRAVENOUS | Status: AC
  Administered 2018-03-19: 2000 mL via INTRAVENOUS

## 2018-03-19 MED ORDER — BENZOCAINE-MENTHOL 20-0.5 % EX AERO
1.0000 "application " | INHALATION_SPRAY | CUTANEOUS | Status: DC | PRN
  Administered 2018-03-20: 1 via TOPICAL
  Filled 2018-03-19: qty 56

## 2018-03-19 MED ORDER — TETANUS-DIPHTH-ACELL PERTUSSIS 5-2.5-18.5 LF-MCG/0.5 IM SUSP: 0.5000 mL | Freq: Once | INTRAMUSCULAR | Status: DC

## 2018-03-19 MED ORDER — IBUPROFEN 600 MG PO TABS
600.0000 mg | ORAL_TABLET | Freq: Four times a day (QID) | ORAL | Status: DC
  Administered 2018-03-19: 600 mg via ORAL
  Filled 2018-03-19: qty 1

## 2018-03-19 MED ORDER — MEASLES, MUMPS & RUBELLA VAC IJ SOLR
0.5000 mL | Freq: Once | INTRAMUSCULAR | Status: DC
  Filled 2018-03-19: qty 0.5

## 2018-03-19 MED ORDER — BUTORPHANOL TARTRATE 1 MG/ML IJ SOLN
1.0000 mg | INTRAMUSCULAR | Status: DC | PRN
  Administered 2018-03-19 (×2): 1 mg via INTRAVENOUS
  Filled 2018-03-19 (×2): qty 1

## 2018-03-19 MED ORDER — ACETAMINOPHEN 500 MG PO TABS
1000.0000 mg | ORAL_TABLET | Freq: Once | ORAL | Status: AC
  Administered 2018-03-19: 1000 mg via ORAL
  Filled 2018-03-19: qty 2

## 2018-03-19 MED ORDER — LACTATED RINGERS IV SOLN
500.0000 mL | Freq: Once | INTRAVENOUS | Status: AC
  Administered 2018-03-19: 500 mL via INTRAVENOUS

## 2018-03-19 MED ORDER — GENTAMICIN SULFATE 40 MG/ML IJ SOLN
5.0000 mg/kg | INTRAVENOUS | Status: DC
  Administered 2018-03-19: 390 mg via INTRAVENOUS
  Filled 2018-03-19: qty 9.75

## 2018-03-19 MED ORDER — FENTANYL 2.5 MCG/ML BUPIVACAINE 1/10 % EPIDURAL INFUSION (WH - ANES)
14.0000 mL/h | INTRAMUSCULAR | Status: DC | PRN
  Administered 2018-03-19 (×2): 14 mL/h via EPIDURAL
  Filled 2018-03-19 (×2): qty 100

## 2018-03-19 MED ORDER — DIBUCAINE 1 % RE OINT: 1.0000 "application " | TOPICAL_OINTMENT | RECTAL | Status: DC | PRN

## 2018-03-19 MED ORDER — ONDANSETRON HCL 4 MG/2ML IJ SOLN: 4.0000 mg | INTRAMUSCULAR | Status: DC | PRN

## 2018-03-19 MED ORDER — ACETAMINOPHEN 325 MG PO TABS
650.0000 mg | ORAL_TABLET | Freq: Four times a day (QID) | ORAL | Status: DC
  Administered 2018-03-20 – 2018-03-21 (×5): 650 mg via ORAL
  Filled 2018-03-19 (×6): qty 2

## 2018-03-19 MED ORDER — PHENYLEPHRINE 40 MCG/ML (10ML) SYRINGE FOR IV PUSH (FOR BLOOD PRESSURE SUPPORT)
80.0000 ug | PREFILLED_SYRINGE | INTRAVENOUS | Status: DC | PRN
  Filled 2018-03-19 (×2): qty 10

## 2018-03-19 MED ORDER — ACETAMINOPHEN 325 MG PO TABS: 650.0000 mg | ORAL_TABLET | ORAL | Status: DC | PRN

## 2018-03-19 MED ORDER — OXYCODONE HCL 5 MG PO TABS
10.0000 mg | ORAL_TABLET | ORAL | Status: DC | PRN
  Administered 2018-03-19: 10 mg via ORAL
  Filled 2018-03-19: qty 2

## 2018-03-19 MED ORDER — SENNOSIDES-DOCUSATE SODIUM 8.6-50 MG PO TABS
2.0000 | ORAL_TABLET | ORAL | Status: DC
  Administered 2018-03-20: 2 via ORAL
  Filled 2018-03-19: qty 2

## 2018-03-19 MED ORDER — ONDANSETRON HCL 4 MG PO TABS
4.0000 mg | ORAL_TABLET | ORAL | Status: DC | PRN
  Administered 2018-03-19: 4 mg via ORAL
  Filled 2018-03-19: qty 1

## 2018-03-19 MED ORDER — FAMOTIDINE 20 MG PO TABS
20.0000 mg | ORAL_TABLET | Freq: Every day | ORAL | Status: DC
  Administered 2018-03-19 – 2018-03-21 (×2): 20 mg via ORAL
  Filled 2018-03-19 (×2): qty 1

## 2018-03-19 MED ORDER — PRENATAL MULTIVITAMIN CH
1.0000 | ORAL_TABLET | Freq: Every day | ORAL | Status: DC
  Administered 2018-03-20 – 2018-03-21 (×2): 1 via ORAL
  Filled 2018-03-19 (×2): qty 1

## 2018-03-19 MED ORDER — SIMETHICONE 80 MG PO CHEW: 80.0000 mg | CHEWABLE_TABLET | ORAL | Status: DC | PRN

## 2018-03-19 MED ORDER — PHENYLEPHRINE 40 MCG/ML (10ML) SYRINGE FOR IV PUSH (FOR BLOOD PRESSURE SUPPORT)
80.0000 ug | PREFILLED_SYRINGE | INTRAVENOUS | Status: DC | PRN
  Filled 2018-03-19: qty 10

## 2018-03-19 MED ORDER — DIPHENHYDRAMINE HCL 50 MG/ML IJ SOLN: 12.5000 mg | INTRAMUSCULAR | Status: DC | PRN

## 2018-03-19 MED ORDER — DIPHENHYDRAMINE HCL 25 MG PO CAPS: 25.0000 mg | ORAL_CAPSULE | Freq: Four times a day (QID) | ORAL | Status: DC | PRN

## 2018-03-19 MED ORDER — IBUPROFEN 800 MG PO TABS
800.0000 mg | ORAL_TABLET | Freq: Four times a day (QID) | ORAL | Status: DC
  Administered 2018-03-20 – 2018-03-21 (×5): 800 mg via ORAL
  Filled 2018-03-19 (×5): qty 1

## 2018-03-19 MED ORDER — SODIUM CHLORIDE 0.9 % IV SOLN
2.0000 g | Freq: Four times a day (QID) | INTRAVENOUS | Status: DC
  Administered 2018-03-19: 2 g via INTRAVENOUS
  Filled 2018-03-19: qty 2
  Filled 2018-03-19: qty 2000

## 2018-03-19 NOTE — Anesthesia Preprocedure Evaluation (Signed)
Anesthesia Evaluation  Patient identified by MRN, date of birth, ID band Patient awake    Reviewed: Allergy & Precautions, NPO status , Patient's Chart, lab work & pertinent test results  Airway Mallampati: II  TM Distance: >3 FB Neck ROM: Full    Dental no notable dental hx.    Pulmonary neg pulmonary ROS, former smoker,    Pulmonary exam normal breath sounds clear to auscultation       Cardiovascular negative cardio ROS Normal cardiovascular exam Rhythm:Regular Rate:Normal     Neuro/Psych negative neurological ROS  negative psych ROS   GI/Hepatic negative GI ROS, Neg liver ROS,   Endo/Other  diabetes, GestationalHypothyroidism Morbid obesity  Renal/GU negative Renal ROS  negative genitourinary   Musculoskeletal negative musculoskeletal ROS (+)   Abdominal   Peds  Hematology negative hematology ROS (+)   Anesthesia Other Findings IOL for polyhydraminos  Reproductive/Obstetrics (+) Pregnancy                             Anesthesia Physical Anesthesia Plan  ASA: III  Anesthesia Plan: Epidural   Post-op Pain Management:    Induction:   PONV Risk Score and Plan: Treatment may vary due to age or medical condition  Airway Management Planned: Natural Airway  Additional Equipment:   Intra-op Plan:   Post-operative Plan:   Informed Consent: I have reviewed the patients History and Physical, chart, labs and discussed the procedure including the risks, benefits and alternatives for the proposed anesthesia with the patient or authorized representative who has indicated his/her understanding and acceptance.       Plan Discussed with: Anesthesiologist  Anesthesia Plan Comments: (Patient identified. Risks, benefits, options discussed with patient including but not limited to bleeding, infection, nerve damage, paralysis, failed block, incomplete pain control, headache, blood pressure  changes, nausea, vomiting, reactions to medication, itching, and post partum back pain. Confirmed with bedside nurse the patient's most recent platelet count. Confirmed with the patient that they are not taking any anticoagulation, have any bleeding history or any family history of bleeding disorders. Patient expressed understanding and wishes to proceed. All questions were answered. )        Anesthesia Quick Evaluation

## 2018-03-19 NOTE — Progress Notes (Signed)
LABOR PROGRESS NOTE  Alyssa Hartman is a 23 y.o. G3P2002 at [redacted]w[redacted]d admitted for IOL for Polyhydramnios.  Subjective: Patient is resting comfortably in bed. With the help of Nurse Selena Batten the patient was checked and an IUPC was successfully placed.   Objective: BP 122/69   Pulse (!) 136   Temp 100.3 F (37.9 C) (Oral)   Resp 18   Ht 5\' 2"  (1.575 m)   Wt 117.9 kg   LMP 07/02/2017 (Exact Date)   SpO2 100%   BMI 47.54 kg/m  or  Vitals:   03/19/18 1131 03/19/18 1201 03/19/18 1231 03/19/18 1301  BP: (!) 138/93 136/75 120/74 122/69  Pulse: (!) 145 (!) 141 (!) 116 (!) 136  Resp: 18 18 18 18   Temp:  100.3 F (37.9 C)    TempSrc:  Oral    SpO2:      Weight:      Height:       Dilation: 6.5 Effacement (%): 60 Cervical Position: Anterior Station: -2 Presentation: Vertex Exam by:: k fields, rn FHT: baseline rate 150, moderate varibility, 15x15acel, variable decel Toco: Cytotec complete  Labs: Lab Results  Component Value Date   WBC 8.3 03/18/2018   HGB 12.3 03/18/2018   HCT 38.1 03/18/2018   MCV 80.9 03/18/2018   PLT 197 03/18/2018    Patient Active Problem List   Diagnosis Date Noted  . Pregnancy 03/18/2018  . GBS (group B Streptococcus carrier), +RV culture, currently pregnant 03/16/2018  . Gestational diabetes 02/03/2018  . Unwanted fertility 12/08/2017  . History of genetic disease 10/13/2017  . History of prior pregnancy with IUGR newborn 10/13/2017  . Biological false positive RPR test 12/06/2016  . Supervision of high risk pregnancy, antepartum 07/28/2016  . Hashimoto's thyroiditis 10/08/2012  . Obesity 10/08/2012  . Bipolar 1 disorder (HCC) 06/23/2011    Assessment / Plan: 23 y.o. G3P2002 at [redacted]w[redacted]d here for IOL for Polyhydramnios.  Labor: Active Fetal Wellbeing:  Cat 1 Pain Control:  Epidural Anticipated MOD:  Vaginal  [name], DO [facility], PGY-1 03/19/2018 1:34 PM

## 2018-03-19 NOTE — Anesthesia Procedure Notes (Signed)
Epidural Patient location during procedure: OB Start time: 03/19/2018 6:20 AM End time: 03/19/2018 6:35 AM  Staffing Anesthesiologist: Elmer Picker, MD Performed: anesthesiologist   Preanesthetic Checklist Completed: patient identified, pre-op evaluation, timeout performed, IV checked, risks and benefits discussed and monitors and equipment checked  Epidural Patient position: sitting Prep: site prepped and draped and DuraPrep Patient monitoring: continuous pulse ox, blood pressure, heart rate and cardiac monitor Approach: midline Location: L3-L4 Injection technique: LOR air  Needle:  Needle type: Tuohy  Needle gauge: 17 G Needle length: 9 cm Needle insertion depth: 8 cm Catheter type: closed end flexible Catheter size: 19 Gauge Catheter at skin depth: 15 cm Test dose: negative  Assessment Sensory level: T8 Events: blood not aspirated, injection not painful, no injection resistance, negative IV test and no paresthesia  Additional Notes Patient identified. Risks/Benefits/Options discussed with patient including but not limited to bleeding, infection, nerve damage, paralysis, failed block, incomplete pain control, headache, blood pressure changes, nausea, vomiting, reactions to medication both or allergic, itching and postpartum back pain. Confirmed with bedside nurse the patient's most recent platelet count. Confirmed with patient that they are not currently taking any anticoagulation, have any bleeding history or any family history of bleeding disorders. Patient expressed understanding and wished to proceed. All questions were answered. Sterile technique was used throughout the entire procedure. Please see nursing notes for vital signs. Test dose was given through epidural catheter and negative prior to continuing to dose epidural or start infusion. Warning signs of high block given to the patient including shortness of breath, tingling/numbness in hands, complete motor block,  or any concerning symptoms with instructions to call for help. Patient was given instructions on fall risk and not to get out of bed. All questions and concerns addressed with instructions to call with any issues or inadequate analgesia.  Reason for block:procedure for pain

## 2018-03-19 NOTE — Progress Notes (Addendum)
OB/GYN Faculty Practice: Labor Progress Note  Subjective:   Alyssa Hartman is a 23 y.o. G3P2002 at [redacted]w[redacted]d with A1GDM admitted for IOL for polyhydramnios.  Patient has progressed minimally with vaginal cytotec. States she continues to have some mild discomfort.    Objective: BP (!) 138/56   Pulse (!) 102   Temp 98.8 F (37.1 C) (Oral)   Resp 17   Ht 5\' 2"  (1.575 m)   Wt 117.9 kg   LMP 07/02/2017 (Exact Date)   BMI 47.54 kg/m  Gen: alert, cooperative Pulm: symmetrical chest wall expansion, no visible respiratory distress Dilation: Fingertip Effacement (%): Thick Station: -3 Presentation: Vertex Exam by:: Aneta Mins, MD  Category 1 currently with FHR of 150's bpm, moderate variability, no decelerations, and appropriate accelerations  Assessment and Plan: Alyssa Hartman is a 22 y.o. G3P2002 at [redacted]w[redacted]d with A1GDM admitted for IOL for polyhydramnios.  Labor: Latent. Placed foley bulb (cook) via speculum by Dr. Aneta Mins.  -- pain control: fentanyl 100mg  q1hr prn --will try cytotec 50mg  PO  Fetal Well-Being: -- Category 1: See above  -- GBS +  Rollene Rotunda, DO PGYI Family Medicine  12:45 AM  OB FELLOW LABOR PROGRESS NOTE ATTESTATION  I have seen and examined this patient and agree with above documentation in the resident's note.   Gwenevere Abbot, MD  OB Fellow  03/19/2018, 12:53 AM

## 2018-03-19 NOTE — Progress Notes (Signed)
Pharmacy Antibiotic Note  Alyssa Hartman is a 23 y.o. female admitted on 03/18/2018 with chorioamnionitis.  Pharmacy has been consulted for gentamicin dosing.  Plan: Start gentamicin 5 mg/kg (390 mg) Q24H. Obtain serum creatinine and/or gentamicin levels is continued beyond 3 days.   Height: 5\' 2"  (157.5 cm) Weight: 259 lb 14.4 oz (117.9 kg) IBW/kg (Calculated) : 50.1  Temp (24hrs), Avg:99.4 F (37.4 C), Min:98.1 F (36.7 C), Max:102.6 F (39.2 C)  Recent Labs  Lab 03/18/18 1422  WBC 8.3    CrCl cannot be calculated (Patient's most recent lab result is older than the maximum 21 days allowed.).    No Known Allergies  Antimicrobials this admission: Penicillin 5 Million Units x1 Penicillin 3 Million Units Q4H 1/23-24 Ampicillin 2gm Q6H 1/24>> Gentamicin 5 mg/kg (390mg ) Q24H 1/24>>  Microbiology results: none  Thank you for allowing pharmacy to be a part of this patient's care.  Derwood Kaplan 03/19/2018 3:36 PM

## 2018-03-19 NOTE — Anesthesia Pain Management Evaluation Note (Signed)
  CRNA Pain Management Visit Note  Patient: Alyssa Hartman, 23 y.o., female  "Hello I am a member of the anesthesia team at [facility]. We have an anesthesia team available at all times to provide care throughout the hospital, including epidural management and anesthesia for C-section. I don't know your plan for the delivery whether it a natural birth, water birth, IV sedation, nitrous supplementation, doula or epidural, but we want to meet your pain goals."   1.Was your pain managed to your expectations on prior hospitalizations?   Yes   2.What is your expectation for pain management during this hospitalization?     Epidural  3.How can we help you reach that goal? Epidural in place and is working well  Record the patient's initial score and the patient's pain goal.   Pain: 3  Pain Goal: 6 [facility] wants you to be able to say your pain was always managed very well.  Carlos Quackenbush 03/19/2018

## 2018-03-19 NOTE — Progress Notes (Signed)
LABOR PROGRESS NOTE  Alyssa Hartman is a 23 y.o. G3P2002 at [redacted]w[redacted]d admitted for IOL for Polyhydramnios.  Subjective: Strip note. Discussed plan of care with RN.   Objective: BP (!) 130/56   Pulse (!) 135   Temp (!) 102.6 F (39.2 C) (Oral)   Resp 18   Ht 5\' 2"  (1.575 m)   Wt 117.9 kg   LMP 07/02/2017 (Exact Date)   SpO2 100%   BMI 47.54 kg/m  or  Vitals:   03/19/18 1431 03/19/18 1500 03/19/18 1501 03/19/18 1517  BP: (!) 126/48  (!) 130/56   Pulse: (!) 118  (!) 135   Resp:  18    Temp:    (!) 102.6 F (39.2 C)  TempSrc:    Oral  SpO2:      Weight:      Height:       Dilation: 8 Effacement (%): 60 Cervical Position: Anterior Station: -2 Presentation: Vertex Exam by:: dr Dareen Piano FHT: baseline rate 180, moderate varibility, 15x15acel, variable decel Toco: q1-3 min   Labs: Lab Results  Component Value Date   WBC 8.3 03/18/2018   HGB 12.3 03/18/2018   HCT 38.1 03/18/2018   MCV 80.9 03/18/2018   PLT 197 03/18/2018    Patient Active Problem List   Diagnosis Date Noted  . Pregnancy 03/18/2018  . GBS (group B Streptococcus carrier), +RV culture, currently pregnant 03/16/2018  . Gestational diabetes 02/03/2018  . Unwanted fertility 12/08/2017  . History of genetic disease 10/13/2017  . History of prior pregnancy with IUGR newborn 10/13/2017  . Biological false positive RPR test 12/06/2016  . Supervision of high risk pregnancy, antepartum 07/28/2016  . Hashimoto's thyroiditis 10/08/2012  . Obesity 10/08/2012  . Bipolar 1 disorder (HCC) 06/23/2011    Assessment / Plan: 23 y.o. G3P2002 at [redacted]w[redacted]d here for IOL for Polyhydramnios. Due to fetal tachycardia and new temperature of 102 degrees, will treat for presumed Triple I with Ampicillin and Gentamycin. Tylenol 1000 mg given and IVF initiated.   Labor: Active. MVUs adequate by IUPC without Pitocin.  Fetal Wellbeing:  Cat II  Pain Control:  Epidural Anticipated MOD:  Vaginal  Marcy Siren, D.O. [facility]  Family Medicine Fellow, [facility], [facility] 03/19/2018, 3:30 PM

## 2018-03-19 NOTE — Progress Notes (Signed)
OB/GYN Faculty Practice: Labor Progress Note  Subjective:   Alyssa Hartman is a 23 y.o. G3P2002 at [redacted]w[redacted]d with A1GDM admitted for IOL for polyhydramnios.  Cook cath still in place. Strip check.   Objective: BP 119/73   Pulse 99   Temp 98.3 F (36.8 C) (Oral)   Resp 17   Ht 5\' 2"  (1.575 m)   Wt 117.9 kg   LMP 07/02/2017 (Exact Date)   BMI 47.54 kg/m  Gen: resting Pulm: symmetrical chest wall expansion, no visible respiratory distress Dilation: Fingertip Effacement (%): Thick Station: -3 Presentation: Vertex Exam by:: Aneta Mins, MD  Category 1 currently with FHR of 140's bpm, moderate variability, no decelerations, and appropriate accelerations  Assessment and Plan: Alyssa Hartman is a 24 y.o. G3P2002 at [redacted]w[redacted]d with A1GDM admitted for IOL for polyhydramnios.  Labor: Latent. Cook cath remains in place. -- pain control: fentanyl 100mg  q1hr prn --cytotec 50mg  PO repeated   Fetal Well-Being: -- Category 1: See above  -- GBS +  Rollene Rotunda, DO PGYI Family Medicine  5:32 AM

## 2018-03-19 NOTE — Progress Notes (Signed)
LABOR PROGRESS NOTE  Alyssa Hartman is a 23 y.o. G3P2002 at [redacted]w[redacted]d  admitted for IOL for A1GDM (uncontrolled) and polyhydramnios.   Subjective: Comfortable with epidural. No concerns.   Objective: BP 125/68   Pulse (!) 105   Temp 98.1 F (36.7 C) (Oral)   Resp 16   Ht 5\' 2"  (1.575 m)   Wt 117.9 kg   LMP 07/02/2017 (Exact Date)   SpO2 100%   BMI 47.54 kg/m  or  Vitals:   03/19/18 0801 03/19/18 0831 03/19/18 0901 03/19/18 0931  BP: (!) 111/91 126/85 127/82 125/68  Pulse: (!) 114 (!) 110 (!) 102 (!) 105  Resp: 16 16 16    Temp:      TempSrc:      SpO2:      Weight:      Height:        Dilation: 6.5 Effacement (%): 60 Cervical Position: Anterior Station: -2 Presentation: Vertex Exam by:: dr Jamesia Linnen FHT: baseline rate 130, moderate varibility, no acel, variable decel Toco: q1-2 min   Labs: Lab Results  Component Value Date   WBC 8.3 03/18/2018   HGB 12.3 03/18/2018   HCT 38.1 03/18/2018   MCV 80.9 03/18/2018   PLT 197 03/18/2018    Patient Active Problem List   Diagnosis Date Noted  . Pregnancy 03/18/2018  . GBS (group B Streptococcus carrier), +RV culture, currently pregnant 03/16/2018  . Gestational diabetes 02/03/2018  . Unwanted fertility 12/08/2017  . History of genetic disease 10/13/2017  . History of prior pregnancy with IUGR newborn 10/13/2017  . Biological false positive RPR test 12/06/2016  . Supervision of high risk pregnancy, antepartum 07/28/2016  . Hashimoto's thyroiditis 10/08/2012  . Obesity 10/08/2012  . Bipolar 1 disorder (HCC) 06/23/2011    Assessment / Plan: 23 y.o. G3P2002 at [redacted]w[redacted]d here for IOL for A1GDM and polyhydramnios.   A1GDM: Mose recent CBGs 96 and 98.   Labor: Induction. S/p FB and Cyto x3. Patient contracting very frequently. Cervix remains thick after FB placement. AROM performed at 0930.  Fetal Wellbeing:  Cat II.  Pain Control:  Epidural in place.  Anticipated MOD:  NSVD   Marcy Siren, D.O. OB Fellow   03/19/2018, 9:36 AM

## 2018-03-20 ENCOUNTER — Encounter (HOSPITAL_COMMUNITY)
Admission: AD | Disposition: A | Discharge status: Home / Self Care | Payer: Self-pay | Source: Home / Self Care | Attending: Obstetrics and Gynecology

## 2018-03-20 LAB — GLUCOSE, CAPILLARY: Glucose-Capillary: 89 mg/dL (ref 70–99)

## 2018-03-20 SURGERY — LIGATION, FALLOPIAN TUBE, POSTPARTUM
Anesthesia: Choice

## 2018-03-20 MED ORDER — FAMOTIDINE 20 MG PO TABS
40.0000 mg | ORAL_TABLET | Freq: Once | ORAL | Status: AC
  Administered 2018-03-20: 40 mg via ORAL
  Filled 2018-03-20: qty 2

## 2018-03-20 MED ORDER — LACTATED RINGERS IV SOLN
INTRAVENOUS | Status: DC
  Administered 2018-03-20: 10 mL via INTRAVENOUS

## 2018-03-20 MED ORDER — METOCLOPRAMIDE HCL 10 MG PO TABS
10.0000 mg | ORAL_TABLET | Freq: Once | ORAL | Status: AC
  Administered 2018-03-20: 10 mg via ORAL
  Filled 2018-03-20: qty 1

## 2018-03-20 NOTE — Progress Notes (Signed)
Post Partum Day 1 Subjective: up ad lib, voiding, tolerating PO and + flatus, complains of abdominal pain. Improved with motrin and tylenol. Worse when getting up and moving around. No fevers, chills, minimal lochia, denies any syncopal/presyncopal episodes or dizziness. Expresses concerns about getting the tubal ligation due to already pain present.   Objective: Blood pressure 106/66, pulse 93, temperature 98.3 F (36.8 C), temperature source Oral, resp. rate 18, height 5\' 2"  (1.575 m), weight 117.9 kg, last menstrual period 07/02/2017, SpO2 100 %, unknown if currently breastfeeding.  Physical Exam:  General: alert and cooperative  Cardio: intact +2 radial pulse bilaterally  Lochia: appropriate Uterine Fundus: firm Incision: n/a DVT Evaluation: No evidence of DVT seen on physical exam.  Recent Labs    03/18/18 1422  HGB 12.3  HCT 38.1    Assessment/Plan: #SVD #BTL -counseled patient on minimal invasiveness of tubal procedure. Patient continues to voice that she would rather wait a couple of days until she has recovered from this pregnancy. Counseled patient that for her overall recovery it would be best to get procedure done sooner rather than later.   Dispo: anticipate patient to remain for >59midnight   LOS: 2 days   Cristine Polio 03/20/2018, 5:10 AM

## 2018-03-20 NOTE — Anesthesia Postprocedure Evaluation (Signed)
Anesthesia Post Note  Patient: Alyssa Hartman  Procedure(s) Performed: AN AD HOC LABOR EPIDURAL     Patient location during evaluation: Mother Baby Anesthesia Type: Epidural Level of consciousness: awake Pain management: satisfactory to patient Vital Signs Assessment: post-procedure vital signs reviewed and stable Respiratory status: spontaneous breathing Cardiovascular status: stable Anesthetic complications: no    Last Vitals:  Vitals:   03/20/18 0501 03/20/18 0830  BP: 106/66 103/65  Pulse: 93 92  Resp: 18 18  Temp: 36.8 C 36.5 C  SpO2:      Last Pain:  Vitals:   03/20/18 0830  TempSrc: Oral  PainSc:    Pain Goal:                   [employer]

## 2018-03-20 NOTE — Progress Notes (Signed)
Called NICU to check on patient, she stated "she is doing fine, just visiting her baby"

## 2018-03-20 NOTE — Progress Notes (Signed)
Spoke with the resident that was rounding, patient prefers to wait till outpatient for tubal. She will see patient.

## 2018-03-20 NOTE — Lactation Note (Signed)
This note was copied from a baby's chart. Lactation Consultation Note  Patient Name: Alyssa Hartman [id number] Date: 03/20/2018 Reason for consult: Initial assessment;Early term 37-38.6wks;NICU baby GDM , hypothyroid  Mom sleeping currently.  LC to go back at a later time.  DEBP set up at bedside.  RN states Mom has been pumping and hand expressing colostrum, and transporting to NICU for baby.   NICU lactation brochure left in room.  Lactation Tools Discussed/Used Pump Review: Setup, frequency, and cleaning;Milk Storage Initiated by:: RN Date initiated:: 03/20/18   Consult Status Consult Status: Follow-up Date: 03/21/18 Follow-up type: In-patient    Alyssa Hartman 03/20/2018, 12:28 PM

## 2018-03-20 NOTE — Lactation Note (Addendum)
This note was copied from a baby's chart. Lactation Consultation Note  Patient Name: Girl Baljit Ribar YVOPF'Y Date: 03/20/2018 Reason for consult: Follow-up assessment;Early term 37-38.6wks;Maternal endocrine disorder;NICU baby Type of Endocrine Disorder?: Diabetes  P3 mother whose infant is now 20 hours old.  This baby is an ETI at 37+1 weeks and in the NICU.  Mother has a DEBP set up in her room and had no questions on how to use the pump.  She is planning on pumping every three hours and will take her EBM to NICU.  Mother is familiar with hand expression and I encouraged this before/after feedings to help increase milk supply.  Mother was not real interested in speaking at this time and continued to focus her attention on her cell phone.    Mom made aware of O/P services, breastfeeding support groups, community resources, and our phone # for post-discharge questions. Encouraged mother to call for questions/concerns as they arise.  Mother verbalized understanding.  Mother has food stamps but not WIC.  She is considering applying for [facility].   Maternal Data Formula Feeding for Exclusion: No Has patient been taught Hand Expression?: Yes  Feeding Feeding Type: Formula Nipple Type: Slow - flow  LATCH Score                   Interventions    Lactation Tools Discussed/Used WIC Program: No(Mother no longer has WIC; she will have to reapply) Initiated by:: Already initiated   Consult Status Consult Status: Follow-up Date: 03/21/18 Follow-up type: In-patient    Stephanos Fan R Chasty Randal 03/20/2018, 7:09 PM

## 2018-03-21 MED ORDER — IBUPROFEN 800 MG PO TABS: 800.0000 mg | ORAL_TABLET | Freq: Three times a day (TID) | ORAL | 0 refills | Status: DC

## 2018-03-21 MED ORDER — ACETAMINOPHEN 325 MG PO TABS: 650.0000 mg | ORAL_TABLET | ORAL | Status: DC | PRN

## 2018-03-21 MED ORDER — SENNOSIDES-DOCUSATE SODIUM 8.6-50 MG PO TABS: 2.0000 | ORAL_TABLET | ORAL | 0 refills | Status: DC

## 2018-03-21 NOTE — Progress Notes (Signed)
Post Partum Day 2  Subjective: The patient is seen this morning sitting upright on the cough in her room. She explains she is having troubles with the FOB because this baby was born without BOR Syndrome and the father and other 2 children have it. She is having custody issues and having a hard time getting her other 2 children back from her mother-in-law. She states she is having abdominal pain and thinks the BTL might make it worse. She is thinking about having it done later. She states she would like to stay in the hospital to be with her baby as she cannot travel back and forth. Otherwise she states her lochia is improving, she is ambulating and eating, drinking, and peeing well. She is passing gas but has not had a BM yet.  Objective: Blood pressure (!) 102/52, pulse 72, temperature (!) 97.5 F (36.4 C), temperature source Oral, resp. rate 18, height 5\' 2"  (1.575 m), weight 117.9 kg, last menstrual period 07/02/2017, SpO2 100 %, unknown if currently breastfeeding.  Physical Exam:  General: alert, cooperative and mild distress Lochia: appropriate Uterine Fundus: firm, at 2cm below the umbilicus Incision: N/A DVT Evaluation: No evidence of DVT seen on physical exam, 1+ nonpitting edema.  Recent Labs    03/18/18 1422  HGB 12.3  HCT 38.1    Assessment/Plan: The patient is undecided whether or not to have her BTL today 03/21/2018. Otherwise she is medically cleared for discharge home.   LOS: 3 days   Alyssa Hartman 03/21/2018, 9:41 AM

## 2018-03-21 NOTE — Lactation Note (Signed)
This note was copied from a baby's chart. Lactation Consultation Note  Patient Name: Alyssa Hartman HUTML'Y Date: 03/21/2018 Reason for consult: Follow-up assessment;NICU baby;Early term 37-38.6wks Type of Endocrine Disorder?: Diabetes  Visited with P3 Mom of ET infant in the NICU.  Baby is 44 hrs old and is still having low CBG's.  Baby taking bottles and has an IV.    Mom hasn't pumped since last night.  Breasts are heavier, but not engorged.  Encouraged Mom to try to pump both breasts every 3 hrs with a goal of 8 pumpings in 24 hrs.  Mom aware of the 2 pumping rooms in the NICU that have Symphony pumps.  Mom to take the tubing and pump parts with her.   Mom doesn't have WIC.  To call tomorrow.  Mom aware of [facility] loaner program available once she does sign up with WIC.   Mom plans to stay with baby all day and possibly through the night, and will use the NICU pump. Recommend Mom ask to have baby STS, and try to latch baby.  Told Mom that baby's RN can call for LC to come help.  Mom has a hand pump and is comfortable with hand expression. Showed Mom how to use the pump parts as a manual double pump.  Encouraged Mom to call Lactation with any questions, after discharge.   Interventions Interventions: Breast feeding basics reviewed;Skin to skin;Breast massage;Hand express;DEBP;Hand pump  Lactation Tools Discussed/Used Tools: Pump Breast pump type: Double-Electric Breast Pump;Manual   Consult Status Consult Status: Complete Date: 03/21/18 Follow-up type: Call as needed    Alyssa Hartman 03/21/2018, 10:36 AM

## 2018-03-21 NOTE — Progress Notes (Signed)
CSW received a telephone call from CPS worker D. Wilder.  CPS confirmed [name] story and communicated that [name]' case with CPS is currently closed.  CPS has no barriers with infant discharging to [name] when medically ready.   Alyssa Hartman, MSW, LCSW Clinical Social Work [phone]  

## 2018-03-21 NOTE — Progress Notes (Signed)
Pt called out for RN and said that she had questions concerning her tubal.  She started crying talking about the problems between her FOB, and his mother and custody issues of her other children.  She said that she does not want to leave the baby that is in the NICU and that she does not have transportation to go back and forth.  She appeared to be wanting the tubal because she was upset and she was hoping that she could stay another night and not have to leave her baby.  She expressed that she "thinks" that 3 kids is enough.  She is very uncertain about what to do. She stated that maybe she should wait 6 weeks and do the tubal outpatient.

## 2018-03-21 NOTE — Clinical Social Work Maternal (Signed)
CLINICAL SOCIAL WORK MATERNAL/CHILD NOTE  Patient Details  Name: Alyssa Hartman MRN: [id number] Date of Birth: 26-Nov-1995  Date:  03/21/2018  Clinical Social Worker Initiating Note:  [name] Date/Time: Initiated:  03/21/18/1236     Child's Name:  Alyssa Hartman   Biological Parents:  Mother, Father(FOB is Carollee Leitz 07/17/1993)   Need for Interpreter:  None   Reason for Referral:  [place] Concerns, Other (Comment)   Address:  [address] [place] [address]    Phone number:  [phone] (home)     Additional phone number:   Household Members/Support Persons (HM/SP):   Household Member/Support Person 1, Household Member/Support Person 2([name] that [name] has 2 older children that are currently in kinship placement  [facility] Epimenio Sarin [phone]).  Per [facility], [name] CPS case is currently closed. )   HM/SP Name Relationship DOB or Age  HM/SP -1 Zyria Richardson-Barra daughter 12/05/26  HM/SP -2 [name] son 01/21/16  HM/SP -3        HM/SP -4        HM/SP -5        HM/SP -6        HM/SP -7        HM/SP -8          Natural Supports (not living in the home):  Extended Family, Immediate Family, Parent(FOB's mother will also provide supports. )   Professional Supports: Organized support group (Comment)([name] that [name] is an established participant with [employer]. )   Employment: Unemployed   Type of Work:     Education:  9 to 11 years([name] that [name] completed the 12th grade but did not receive a diploma.)   Homebound arranged: No  Financial Resources:  [employer]   Other Resources:  Physicist, medical , [employer]    Cultural/Religious Considerations Which May Impact Care:  Per [employer] Face sheet, [name] is Jehovah Witness.  Strengths:  Ability to meet basic needs , Home prepared for child , Understanding of illness   Psychotropic Medications:         Pediatrician:       Pediatrician  List:   [facility]      Pediatrician Fax Number:    Risk Factors/Current Problems:  Transportation , Mental Health Concerns    Cognitive State:  Alert    Mood/Affect:  Comfortable , Interested , Happy , Relaxed    CSW Assessment: CSW met with [name] in room 109 to complete an assessment for MH hx, NICU admission, and CPS hx.  When CSW arrived, [name] was packing her items in preparation for discharge. CSW explained CSW's role and [name] was polite, easy to engage, and receptive to meeting with CSW.   CSW inquired about [name] thought and feeling as it related to infant's NICU admission.. [name], "I feel ok, I understand why she needed to go to the NICU." CSW assessed for barriers to [facility] visiting with infant after [name] discharges.  [name] having limited transportation and feeling overwhelmed about how she will visit with infant daily.  CSW provided [name] with information to apply for Medicaid Transportation and [name] was receptive; [name] voiced being familiar with application process.  CSW all provided [name] wit 3 one ride bus passes; [name] was appreciative. [name] denied all other psychosocial stressors and communicated having all essential  items to care for infant.   CSW asked about [name] MH hx and [name] openly shared her dx of bipolar.  Per [name], [name] was dx about 3 years ago reported not having any symptoms within the past 2 years. [name] communicated that [name] was initially regulated with medications (names unknown) and decided to discontinue all meds after her first pregnancy. [name] having a therapist in the past but is no longer an established patient.  CSW provided [name] with a list of outpatient counseling and [name] was receptive to a referral to the [employer].  CSW provided education regarding the baby blues period vs. perinatal mood disorders, discussed treatment and gave resources for  mental health follow up if concerns arise.  CSW recommends self-evaluation during the postpartum time period using the New Mom Checklist from Postpartum Progress and encouraged [name] to contact a medical professional if symptoms are noted at any time.  [name] did not present with any acute symptoms however, CSW suspect that [name] may have some cognitive limitations (based on [name] responses to some of CSW's questions and [name] communicating that she completed 12th but did not receive a diploma).   CSW asked about [name] CPS hx and [name] disclosed that [name] older 2 children are currently residing with [facility] at the request of CPS. [name] stated, "CPS told me I had to place my kids with her or they will have to go to foster care."  [name] shared that at the time of CPS involvement [name] was unable to provided basic essential for her children. However, she reports that providing the essential items are no longer a problem or concern.  [name] also reported that she has the support of her mother and sister.  CSW made [name] aware the CSW will need to notify [facility] CPS due to [name] CPS hx (a voicemail was left with [place]).   CSW will continue to offer [name] resources and supports while infant remains in NICU.   CSW Plan/Description:  Psychosocial Support and Ongoing Assessment of Needs, Perinatal Mood and Anxiety Disorder (PMADs) Education, Other Patient/Family Education, Child Protective Service Report , No Further Intervention Required/No Barriers to Discharge   [name], MSW, LCSW Clinical Social Work [phone]  Dimple Nanas, LCSW 03/21/2018, 1:39 PM

## 2018-03-21 NOTE — Discharge Instructions (Signed)
Vaginal Delivery, Care After Refer to this sheet in the next few weeks. These discharge instructions provide you with information on caring for yourself after delivery. Your caregiver may also give you specific instructions. Your treatment has been planned according to the most current medical practices available, but problems sometimes occur. Call your caregiver if you have any problems or questions after you go home. HOME CARE INSTRUCTIONS 1. Take over-the-counter or prescription medicines only as directed by your caregiver or pharmacist. 2. Do not drink alcohol, especially if you are breastfeeding or taking medicine to relieve pain. 3. Do not smoke tobacco. 4. Continue to use good perineal care. Good perineal care includes: 1. Wiping your perineum from back to front 2. Keeping your perineum clean. 3. You can do sitz baths twice a day, to help keep this area clean 5. Do not use tampons, douche or have sex until your caregiver says it is okay. 6. Shower only and avoid sitting in submerged water, aside from sitz baths 7. Wear a well-fitting bra that provides breast support. 8. Eat healthy foods. 9. Drink enough fluids to keep your urine clear or pale yellow. 10. Eat high-fiber foods such as whole grain cereals and breads, brown rice, beans, and fresh fruits and vegetables every day. These foods may help prevent or relieve constipation. 11. Avoid constipation with high fiber foods or medications, such as miralax or metamucil 12. Follow your caregiver's recommendations regarding resumption of activities such as climbing stairs, driving, lifting, exercising, or traveling. 13. Talk to your caregiver about resuming sexual activities. Resumption of sexual activities is dependent upon your risk of infection, your rate of healing, and your comfort and desire to resume sexual activity. 14. Try to have someone help you with your household activities and your newborn for at least a few days after you leave  the hospital. 15. Rest as much as possible. Try to rest or take a nap when your newborn is sleeping. 16. Increase your activities gradually. 17. Keep all of your scheduled postpartum appointments. It is very important to keep your scheduled follow-up appointments. At these appointments, your caregiver will be checking to make sure that you are healing physically and emotionally. SEEK MEDICAL CARE IF:   You are passing large clots from your vagina. Save any clots to show your caregiver.  You have a foul smelling discharge from your vagina.  You have trouble urinating.  You are urinating frequently.  You have pain when you urinate.  You have a change in your bowel movements.  You have increasing redness, pain, or swelling near your vaginal incision (episiotomy) or vaginal tear.  You have pus draining from your episiotomy or vaginal tear.  Your episiotomy or vaginal tear is separating.  You have painful, hard, or reddened breasts.  You have a severe headache.  You have blurred vision or see spots.  You feel sad or depressed.  You have thoughts of hurting yourself or your newborn.  You have questions about your care, the care of your newborn, or medicines.  You are dizzy or light-headed.  You have a rash.  You have nausea or vomiting.  You were breastfeeding and have not had a menstrual period within 12 weeks after you stopped breastfeeding.  You are not breastfeeding and have not had a menstrual period by the 12th week after delivery.  You have a fever. SEEK IMMEDIATE MEDICAL CARE IF:   You have persistent pain.  You have chest pain.  You have shortness of breath.    You faint.  You have leg pain.  You have stomach pain.  Your vaginal bleeding saturates two or more sanitary pads in 1 hour. MAKE SURE YOU:   Understand these instructions.  Will watch your condition.  Will get help right away if you are not doing well or get worse. Document Released:  02/08/2000 Document Revised: 06/27/2013 Document Reviewed: 10/08/2011 ExitCare Patient Information 2015 [employer]. This information is not intended to replace advice given to you by your health care provider. Make sure you discuss any questions you have with your health care provider.  Sitz Bath A sitz bath is a warm water bath taken in the sitting position. The water covers only the hips and butt (buttocks). We recommend using one that fits in the toilet, to help with ease of use and cleanliness. It may be used for either healing or cleaning purposes. Sitz baths are also used to relieve pain, itching, or muscle tightening (spasms). The water may contain medicine. Moist heat will help you heal and relax.  HOME CARE  Take 3 to 4 sitz baths a day. 18. Fill the bathtub half-full with warm water. 19. Sit in the water and open the drain a little. 20. Turn on the warm water to keep the tub half-full. Keep the water running constantly. 21. Soak in the water for 15 to 20 minutes. 22. After the sitz bath, pat the affected area dry. GET HELP RIGHT AWAY IF: You get worse instead of better. Stop the sitz baths if you get worse. MAKE SURE YOU:  Understand these instructions.  Will watch your condition.  Will get help right away if you are not doing well or get worse. Document Released: 03/20/2004 Document Revised: 11/05/2011 Document Reviewed: 06/10/2010 ExitCare Patient Information 2015 [employer]. This information is not intended to replace advice given to you by your health care provider. Make sure you discuss any questions you have with your health care provider.    

## 2018-04-05 ENCOUNTER — Encounter: Payer: Self-pay | Admitting: Obstetrics and Gynecology

## 2018-04-05 ENCOUNTER — Ambulatory Visit (INDEPENDENT_AMBULATORY_CARE_PROVIDER_SITE_OTHER): Payer: Medicaid Other | Admitting: Obstetrics and Gynecology

## 2018-04-05 VITALS — BP 114/76 | HR 79 | Wt 233.8 lb

## 2018-04-05 DIAGNOSIS — O99345 Other mental disorders complicating the puerperium: Principal | ICD-10-CM

## 2018-04-05 DIAGNOSIS — Z1389 Encounter for screening for other disorder: Secondary | ICD-10-CM | POA: Diagnosis not present

## 2018-04-05 DIAGNOSIS — Z3009 Encounter for other general counseling and advice on contraception: Secondary | ICD-10-CM

## 2018-04-05 DIAGNOSIS — F53 Postpartum depression: Secondary | ICD-10-CM

## 2018-04-05 NOTE — Progress Notes (Signed)
23 yo P3 s/p SVD 03/19/18 here for BTL consult. Patient is doing well and is without complaints. Patient with high depression screening score. She admits to being overwhelmed. She declines to see Asher Muir today  Past Medical History:  Diagnosis Date  . Carpal tunnel syndrome   . Hx of Hashimoto thyroiditis   . Hypothyroid    Past Surgical History:  Procedure Laterality Date  . NO PAST SURGERIES     Family History  Problem Relation Age of Onset  . Diabetes Maternal Grandmother   . Hypertension Maternal Grandmother    Social History   Tobacco Use  . Smoking status: Former Smoker    Last attempt to quit: 03/18/2013    Years since quitting: 5.0  . Smokeless tobacco: Never Used  Substance Use Topics  . Alcohol use: No  . Drug use: No   ROS See pertinent in HPI  Blood pressure 114/76, pulse 79, weight 233 lb 12.8 oz (106.1 kg), last menstrual period 07/02/2017, currently breastfeeding. GENERAL: Well-developed, well-nourished female in no acute distress.  NEURO: alert and oriented x 3  A/P 23 yo here for sterilization consultation - Risks and benefits of procedure discussed with patient including permanence of method, bleeding, infection, injury to surrounding organs and need for additional procedures. Risk failure of 0.5-1% with increased risk of ectopic gestation if pregnancy occurs was also discussed with patient. - Patient declined contraception at this point and plans to abstain until her surgery - Referral to intergrative behavioral health placed

## 2018-04-05 NOTE — Progress Notes (Signed)
Pt is 2 weeks postpartum SVD. Pt initially wanted BTL but then declined directly after delivery. Pt is here now and does want her tubes tied. EPDS: score 10

## 2018-04-06 ENCOUNTER — Telehealth: Payer: Self-pay | Admitting: *Deleted

## 2018-04-06 ENCOUNTER — Other Ambulatory Visit: Payer: Self-pay | Admitting: Obstetrics and Gynecology

## 2018-04-06 ENCOUNTER — Encounter (HOSPITAL_COMMUNITY): Payer: Self-pay

## 2018-04-06 NOTE — Telephone Encounter (Signed)
Late Entry:  Family connect nurse called to office yesterday stating her concerns for pt at visit. States pt scored 10 on Edinburgh scale, is Bipolar-not on medication. Nurse recommends that pt be seen at [facility] (previous pt there) and that she may want to start on medication again.   Pt was seen in office by provider on 2/10, declined to see Marijean Niemann for counseling.  Will forward to provider for review.

## 2018-04-09 NOTE — Telephone Encounter (Signed)
Pt has been scheduled with Marijean Niemann at [facility]

## 2018-04-12 ENCOUNTER — Institutional Professional Consult (permissible substitution): Payer: Medicaid Other

## 2018-04-16 ENCOUNTER — Ambulatory Visit: Payer: Medicaid Other | Admitting: Medical

## 2018-04-26 ENCOUNTER — Ambulatory Visit: Payer: Medicaid Other | Admitting: Advanced Practice Midwife

## 2018-04-27 ENCOUNTER — Telehealth: Payer: Self-pay | Admitting: Advanced Practice Midwife

## 2018-04-27 NOTE — Telephone Encounter (Signed)
LVM to CB and reschedule Postpartum appointment. And also to remind her of her 2 hour GTT on Friday

## 2018-04-30 ENCOUNTER — Other Ambulatory Visit: Payer: Medicaid Other

## 2018-05-03 ENCOUNTER — Other Ambulatory Visit: Payer: Medicaid Other

## 2018-05-06 ENCOUNTER — Other Ambulatory Visit: Payer: Medicaid Other

## 2018-05-11 ENCOUNTER — Ambulatory Visit: Payer: Medicaid Other | Admitting: Advanced Practice Midwife

## 2018-05-12 ENCOUNTER — Telehealth: Payer: Self-pay | Admitting: *Deleted

## 2018-05-12 NOTE — Telephone Encounter (Signed)
I called patient to reschedule an appointment she missed yesterday, no answer left voicemail

## 2018-05-19 ENCOUNTER — Ambulatory Visit (HOSPITAL_BASED_OUTPATIENT_CLINIC_OR_DEPARTMENT_OTHER)
Admission: RE | Admit: 2018-05-19 | Payer: Medicaid Other | Source: Home / Self Care | Admitting: Obstetrics and Gynecology

## 2018-05-19 ENCOUNTER — Encounter (HOSPITAL_BASED_OUTPATIENT_CLINIC_OR_DEPARTMENT_OTHER): Admission: RE | Payer: Self-pay | Source: Home / Self Care

## 2018-05-19 SURGERY — LIGATION, FALLOPIAN TUBE, LAPAROSCOPIC
Anesthesia: Choice | Laterality: Bilateral

## 2018-05-22 ENCOUNTER — Encounter (HOSPITAL_COMMUNITY): Payer: Self-pay | Admitting: Emergency Medicine

## 2018-05-22 ENCOUNTER — Emergency Department (HOSPITAL_COMMUNITY)
Admission: EM | Admit: 2018-05-22 | Discharge: 2018-05-22 | Disposition: A | Discharge status: Home / Self Care | Payer: Medicaid Other | Attending: Emergency Medicine | Admitting: Emergency Medicine

## 2018-05-22 DIAGNOSIS — Z113 Encounter for screening for infections with a predominantly sexual mode of transmission: Secondary | ICD-10-CM | POA: Insufficient documentation

## 2018-05-22 DIAGNOSIS — Z87891 Personal history of nicotine dependence: Secondary | ICD-10-CM | POA: Insufficient documentation

## 2018-05-22 DIAGNOSIS — E039 Hypothyroidism, unspecified: Secondary | ICD-10-CM | POA: Diagnosis not present

## 2018-05-22 DIAGNOSIS — Z202 Contact with and (suspected) exposure to infections with a predominantly sexual mode of transmission: Secondary | ICD-10-CM | POA: Diagnosis present

## 2018-05-22 LAB — RAPID HIV SCREEN (HIV 1/2 AB+AG)
HIV 1/2 Antibodies: NONREACTIVE
HIV-1 P24 ANTIGEN - HIV24: NONREACTIVE

## 2018-05-22 LAB — PREGNANCY, URINE: Preg Test, Ur: NEGATIVE

## 2018-05-22 MED ORDER — AZITHROMYCIN 1 G PO PACK
1.0000 g | PACK | Freq: Once | ORAL | Status: AC
  Administered 2018-05-22: 1 g via ORAL
  Filled 2018-05-22: qty 1

## 2018-05-22 MED ORDER — STERILE WATER FOR INJECTION IJ SOLN
INTRAMUSCULAR | Status: AC
  Filled 2018-05-22: qty 10

## 2018-05-22 MED ORDER — CEFTRIAXONE SODIUM 250 MG IJ SOLR
250.0000 mg | Freq: Once | INTRAMUSCULAR | Status: AC
  Administered 2018-05-22: 250 mg via INTRAMUSCULAR
  Filled 2018-05-22: qty 250

## 2018-05-22 NOTE — ED Provider Notes (Signed)
[facility] EMERGENCY DEPARTMENT Provider Note   CSN: [id number] Arrival date & time: 05/22/18  1600    History   Chief Complaint No chief complaint on file.   HPI Alyssa Hartman is a 23 y.o. female.     Patient is a 23 year old African-American female with past medical history of hypothyroidism who presents emergency department for STD exposure.  She reports that "my baby daddy" who she has had sexual intercourse with the last couple of days states that he got tested for chlamydia and it was positive.  She reports that she would like to be tested for STDs and HIV.  She is not having any symptoms at all.     Past Medical History:  Diagnosis Date  . Carpal tunnel syndrome   . Hx of Hashimoto thyroiditis   . Hypothyroid     Patient Active Problem List   Diagnosis Date Noted  . Pregnancy 03/18/2018  . GBS (group B Streptococcus carrier), +RV culture, currently pregnant 03/16/2018  . Gestational diabetes 02/03/2018  . Unwanted fertility 12/08/2017  . History of genetic disease 10/13/2017  . History of prior pregnancy with IUGR newborn 10/13/2017  . Biological false positive RPR test 12/06/2016  . Supervision of high risk pregnancy, antepartum 07/28/2016  . Hashimoto's thyroiditis 10/08/2012  . Obesity 10/08/2012  . Bipolar 1 disorder (HCC) 06/23/2011    Past Surgical History:  Procedure Laterality Date  . NO PAST SURGERIES       OB History    Gravida  3   Para  3   Term  3   Preterm  0   AB      Living  3     SAB      TAB      Ectopic      Multiple  0   Live Births  3            Home Medications    Prior to Admission medications   Medication Sig Start Date End Date Taking? Authorizing Provider  acetaminophen (TYLENOL) 325 MG tablet Take 2 tablets (650 mg total) by mouth every 4 (four) hours as needed (breakthrough pain). Patient not taking: Reported on 04/05/2018 03/21/18   Tamera Stands, DO  calcium carbonate  (TUMS - DOSED IN MG ELEMENTAL CALCIUM) 500 MG chewable tablet Chew 2 tablets by mouth at bedtime as needed for indigestion or heartburn.    [provider]  ibuprofen (ADVIL,MOTRIN) 800 MG tablet Take 1 tablet (800 mg total) by mouth 3 (three) times daily. Patient not taking: Reported on 04/05/2018 03/21/18   Tamera Stands, DO  Prenatal Vit-Fe Fumarate-FA (PRENATAL MULTIVITAMIN) TABS tablet Take 1 tablet by mouth daily at 12 noon. Patient not taking: Reported on 04/05/2018 10/29/17   Calvert Cantor, CNM  senna-docusate (SENOKOT-S) 8.6-50 MG tablet Take 2 tablets by mouth daily. Patient not taking: Reported on 04/05/2018 03/22/18   Tamera Stands, DO    Family History Family History  Problem Relation Age of Onset  . Diabetes Maternal Grandmother   . Hypertension Maternal Grandmother     Social History Social History   Tobacco Use  . Smoking status: Former Smoker    Last attempt to quit: 03/18/2013    Years since quitting: 5.1  . Smokeless tobacco: Never Used  Substance Use Topics  . Alcohol use: No  . Drug use: No     Allergies   Patient has no known allergies.   Review of Systems  Review of Systems  Constitutional: Negative for chills and fever.  HENT: Negative for ear pain and sore throat.   Eyes: Negative for pain and visual disturbance.  Respiratory: Negative for cough and shortness of breath.   Cardiovascular: Negative for chest pain and palpitations.  Gastrointestinal: Negative for abdominal pain and vomiting.  Genitourinary: Negative for decreased urine volume, difficulty urinating, dysuria, hematuria, pelvic pain, urgency, vaginal bleeding and vaginal pain.  Musculoskeletal: Negative for arthralgias and back pain.  Skin: Negative for color change and rash.  Neurological: Negative for seizures and syncope.  All other systems reviewed and are negative.    Physical Exam Updated Vital Signs BP (!) 139/94 (BP Location: Right Arm)   Pulse 98   Temp  98.4 F (36.9 C) (Oral)   Resp 18   LMP 07/02/2017 (Exact Date)   SpO2 99%   Physical Exam Vitals signs and nursing note reviewed.  Constitutional:      Appearance: Normal appearance.  HENT:     Head: Normocephalic.  Eyes:     Conjunctiva/sclera: Conjunctivae normal.  Cardiovascular:     Rate and Rhythm: Normal rate and regular rhythm.  Pulmonary:     Effort: Pulmonary effort is normal.  Abdominal:     General: Abdomen is flat.     Tenderness: There is no abdominal tenderness. There is no guarding.  Skin:    General: Skin is dry.  Neurological:     Mental Status: She is alert.  Psychiatric:        Mood and Affect: Mood normal.      ED Treatments / Results  Labs (all labs ordered are listed, but only abnormal results are displayed) Labs Reviewed  PREGNANCY, URINE  HIV ANTIBODY (ROUTINE TESTING W REFLEX)  RAPID HIV SCREEN (HIV 1/2 AB+AG)  GC/CHLAMYDIA PROBE AMP ([place]) NOT AT [facility]    EKG None  Radiology No results found.  Procedures Procedures (including critical care time)  Medications Ordered in ED Medications  cefTRIAXone (ROCEPHIN) injection 250 mg (has no administration in time range)  azithromycin (ZITHROMAX) powder 1 g (has no administration in time range)     Initial Impression / Assessment and Plan / ED Course  I have reviewed the triage vital signs and the nursing notes.  Pertinent labs & imaging results that were available during my care of the patient were reviewed by me and considered in my medical decision making (see chart for details).  Clinical Course as of May 21 1644  Sat May 22, 2018  1620 Patient was counseled on safe sex and to refrain from any sexual activity at all until she is cleared by the health department.  She opted for STD testing through her urine.  She was advised to follow-up with her STD screening results on MyChart.   [KM]    Clinical Course User Index [KM] Arlyn Dunning, PA-C       Based on review of  vitals, medical screening exam, lab work and/or imaging, there does not appear to be an acute, emergent etiology for the patient's symptoms. Counseled pt on good return precautions and encouraged both PCP and ED follow-up as needed.    Clinical Impression: 1. STD exposure     Disposition: Discharge   This note was prepared with assistance of Dragon voice recognition software. Occasional wrong-word or sound-a-like substitutions may have occurred due to the inherent limitations of voice recognition software.   Final Clinical Impressions(s) / ED Diagnoses   Final diagnoses:  STD exposure  ED Discharge Orders    None       Jeral Pinch 05/22/18 1647    Virgina Norfolk, DO 05/22/18 1730

## 2018-05-22 NOTE — ED Notes (Signed)
Patient verbalizes understanding of discharge instructions . Opportunity for questions and answers were provided . Armband removed by staff ,Pt discharged from ED. W/C  offered at D/C  and Declined W/C at D/C and was escorted to lobby by RN.  

## 2018-05-22 NOTE — Discharge Instructions (Addendum)
No sexual activity until you have been cleared by the health department.  Please follow-up with your test results on MyChart.

## 2018-05-22 NOTE — ED Triage Notes (Signed)
Pt reports her boyfriend called her and said he has chlamydia, wants to be tested.

## 2018-05-23 LAB — HIV ANTIBODY (ROUTINE TESTING W REFLEX): HIV Screen 4th Generation wRfx: NONREACTIVE

## 2018-05-24 LAB — GC/CHLAMYDIA PROBE AMP (~~LOC~~) NOT AT ARMC
Chlamydia: NEGATIVE
Neisseria Gonorrhea: POSITIVE — AB

## 2018-05-25 ENCOUNTER — Encounter (HOSPITAL_COMMUNITY): Payer: Self-pay | Admitting: Emergency Medicine

## 2018-05-25 ENCOUNTER — Emergency Department (HOSPITAL_COMMUNITY): Payer: Medicaid Other

## 2018-05-25 ENCOUNTER — Other Ambulatory Visit: Payer: Self-pay

## 2018-05-25 ENCOUNTER — Emergency Department (HOSPITAL_COMMUNITY)
Admission: EM | Admit: 2018-05-25 | Discharge: 2018-05-25 | Disposition: A | Discharge status: Home / Self Care | Payer: Medicaid Other | Attending: Emergency Medicine | Admitting: Emergency Medicine

## 2018-05-25 ENCOUNTER — Telehealth: Payer: Self-pay

## 2018-05-25 DIAGNOSIS — E039 Hypothyroidism, unspecified: Secondary | ICD-10-CM | POA: Insufficient documentation

## 2018-05-25 DIAGNOSIS — Z79899 Other long term (current) drug therapy: Secondary | ICD-10-CM | POA: Diagnosis not present

## 2018-05-25 DIAGNOSIS — Z87891 Personal history of nicotine dependence: Secondary | ICD-10-CM | POA: Diagnosis not present

## 2018-05-25 DIAGNOSIS — R0789 Other chest pain: Secondary | ICD-10-CM

## 2018-05-25 LAB — CBC WITH DIFFERENTIAL/PLATELET
Abs Immature Granulocytes: 0.04 10*3/uL (ref 0.00–0.07)
Basophils Absolute: 0 10*3/uL (ref 0.0–0.1)
Basophils Relative: 0 %
Eosinophils Absolute: 0.2 10*3/uL (ref 0.0–0.5)
Eosinophils Relative: 4 %
HCT: 36.5 % (ref 36.0–46.0)
Hemoglobin: 11.2 g/dL — ABNORMAL LOW (ref 12.0–15.0)
Immature Granulocytes: 1 %
Lymphocytes Relative: 37 %
Lymphs Abs: 2.5 10*3/uL (ref 0.7–4.0)
MCH: 24.6 pg — ABNORMAL LOW (ref 26.0–34.0)
MCHC: 30.7 g/dL (ref 30.0–36.0)
MCV: 80 fL (ref 80.0–100.0)
Monocytes Absolute: 0.6 10*3/uL (ref 0.1–1.0)
Monocytes Relative: 8 %
Neutro Abs: 3.5 10*3/uL (ref 1.7–7.7)
Neutrophils Relative %: 50 %
Platelets: 250 10*3/uL (ref 150–400)
RBC: 4.56 MIL/uL (ref 3.87–5.11)
RDW: 14.7 % (ref 11.5–15.5)
WBC: 6.9 10*3/uL (ref 4.0–10.5)
nRBC: 0 % (ref 0.0–0.2)

## 2018-05-25 LAB — D-DIMER, QUANTITATIVE: D-Dimer, Quant: 0.31 ug/mL-FEU (ref 0.00–0.50)

## 2018-05-25 LAB — BASIC METABOLIC PANEL
Anion gap: 9 (ref 5–15)
BUN: 9 mg/dL (ref 6–20)
CO2: 25 mmol/L (ref 22–32)
Calcium: 9.5 mg/dL (ref 8.9–10.3)
Chloride: 105 mmol/L (ref 98–111)
Creatinine, Ser: 0.84 mg/dL (ref 0.44–1.00)
GFR calc Af Amer: >60 mL/min (ref >60)
GFR calc non Af Amer: >60 mL/min (ref >60)
Glucose, Bld: 93 mg/dL (ref 70–99)
Potassium: 3.8 mmol/L (ref 3.5–5.1)
SODIUM: 139 mmol/L (ref 135–145)

## 2018-05-25 LAB — I-STAT BETA HCG BLOOD, ED (MC, WL, AP ONLY): I-stat hCG, quantitative: <5 m[IU]/mL (ref <5)

## 2018-05-25 LAB — TROPONIN I: Troponin I: <0.03 ng/mL (ref <0.03)

## 2018-05-25 MED ORDER — HYDROXYZINE HCL 25 MG PO TABS
25.0000 mg | ORAL_TABLET | Freq: Once | ORAL | Status: AC
  Administered 2018-05-25: 25 mg via ORAL
  Filled 2018-05-25: qty 1

## 2018-05-25 NOTE — Telephone Encounter (Signed)
Patient called and left a voicemail stating that she is having shortness of breath and feels like she might have Corona virus. After review patients chart, she was just seen in the ED a couple hours ago and they cleared her of everything and sent her home to self quarantine. I called patient back to speak with her about this, but she did not answer. I left a voicemail asking for patient to call back.

## 2018-05-25 NOTE — ED Triage Notes (Signed)
BIB EMS from home with reported CP, SOB, cough for about 1 week. Denies fever, travel, or contact with known COVID, but states that she's been around others that have "been out" and have a cough. Assessed by EMS several times this week without transport. States s/sx worse whenever she lays down to sleep. States it "feels like I'm about to die. Like my body is about to shut down." Given 324mg  ASA en route by EMS.

## 2018-05-25 NOTE — ED Provider Notes (Signed)
[facility] EMERGENCY DEPARTMENT Provider Note   CSN: [id number] Arrival date & time: 05/25/18  0126    History   Chief Complaint Chief Complaint  Patient presents with  . Chest Pain  . Shortness of Breath  . Cough    HPI Alyssa Hartman is a 23 y.o. female.     Patient presents via EMS with shortness of breath that woke her from sleep.  States she is had intermittent chest pain for the past 1 week that comes and goes lasting for several minutes to hours at a time.  Sometimes it is on the right side of her chest, sometimes in the middle.  She does not have any chest pain currently.  She reports intermittent shortness of breath and dry cough.  No fevers, chills, nausea or vomiting.  States she feels like she cannot breathe when she goes to sleep.  Reports she has been tested for sleep apnea and is been negative.  Denies any leg pain or leg swelling.  No abdominal pain, nausea or vomiting.  She does not take any birth control.  History of hypothyroidism not on medication.  Denies any known contacts with coronavirus or recent travel.  No fever, congestion, sore throat or rhinorrhea.   Chest Pain  Associated symptoms: cough and shortness of breath   Associated symptoms: no abdominal pain, no dizziness, no fever, no headache, no nausea, no vomiting and no weakness   Shortness of Breath  Associated symptoms: chest pain and cough   Associated symptoms: no abdominal pain, no fever, no headaches, no rash and no vomiting   Cough  Associated symptoms: chest pain and shortness of breath   Associated symptoms: no fever, no headaches, no myalgias, no rash and no rhinorrhea     Past Medical History:  Diagnosis Date  . Carpal tunnel syndrome   . Hx of Hashimoto thyroiditis   . Hypothyroid     Patient Active Problem List   Diagnosis Date Noted  . Pregnancy 03/18/2018  . GBS (group B Streptococcus carrier), +RV culture, currently pregnant 03/16/2018  . Gestational  diabetes 02/03/2018  . Unwanted fertility 12/08/2017  . History of genetic disease 10/13/2017  . History of prior pregnancy with IUGR newborn 10/13/2017  . Biological false positive RPR test 12/06/2016  . Supervision of high risk pregnancy, antepartum 07/28/2016  . Hashimoto's thyroiditis 10/08/2012  . Obesity 10/08/2012  . Bipolar 1 disorder (HCC) 06/23/2011    Past Surgical History:  Procedure Laterality Date  . NO PAST SURGERIES       OB History    Gravida  3   Para  3   Term  3   Preterm  0   AB      Living  3     SAB      TAB      Ectopic      Multiple  0   Live Births  3            Home Medications    Prior to Admission medications   Medication Sig Start Date End Date Taking? Authorizing Provider  acetaminophen (TYLENOL) 325 MG tablet Take 2 tablets (650 mg total) by mouth every 4 (four) hours as needed (breakthrough pain). Patient not taking: Reported on 04/05/2018 03/21/18   Tamera Stands, DO  calcium carbonate (TUMS - DOSED IN MG ELEMENTAL CALCIUM) 500 MG chewable tablet Chew 2 tablets by mouth at bedtime as needed for indigestion or heartburn.  [provider]  ibuprofen (ADVIL,MOTRIN) 800 MG tablet Take 1 tablet (800 mg total) by mouth 3 (three) times daily. Patient not taking: Reported on 04/05/2018 03/21/18   Tamera Stands, DO  Prenatal Vit-Fe Fumarate-FA (PRENATAL MULTIVITAMIN) TABS tablet Take 1 tablet by mouth daily at 12 noon. Patient not taking: Reported on 04/05/2018 10/29/17   Calvert Cantor, CNM  senna-docusate (SENOKOT-S) 8.6-50 MG tablet Take 2 tablets by mouth daily. Patient not taking: Reported on 04/05/2018 03/22/18   Tamera Stands, DO    Family History Family History  Problem Relation Age of Onset  . Diabetes Maternal Grandmother   . Hypertension Maternal Grandmother     Social History Social History   Tobacco Use  . Smoking status: Former Smoker    Last attempt to quit: 03/18/2013    Years since  quitting: 5.1  . Smokeless tobacco: Never Used  Substance Use Topics  . Alcohol use: No  . Drug use: No     Allergies   Patient has no known allergies.   Review of Systems Review of Systems  Constitutional: Negative for activity change, appetite change and fever.  HENT: Negative for congestion and rhinorrhea.   Respiratory: Positive for cough, chest tightness and shortness of breath.   Cardiovascular: Positive for chest pain.  Gastrointestinal: Negative for abdominal pain, nausea and vomiting.  Genitourinary: Negative for dysuria and hematuria.  Musculoskeletal: Negative for arthralgias and myalgias.  Skin: Negative for rash.  Neurological: Negative for dizziness, weakness and headaches.   all other systems are negative except as noted in the HPI and PMH.     Physical Exam Updated Vital Signs BP 122/85   Pulse 74   Temp 98.3 F (36.8 C) (Oral)   Resp 18   Ht 5\' 2"  (1.575 m)   Wt 105.7 kg   LMP 05/19/2018   SpO2 100%   BMI 42.62 kg/m   Physical Exam Vitals signs and nursing note reviewed.  Constitutional:      General: She is not in acute distress.    Appearance: She is well-developed. She is obese.  HENT:     Head: Normocephalic and atraumatic.     Mouth/Throat:     Pharynx: No oropharyngeal exudate.  Eyes:     Conjunctiva/sclera: Conjunctivae normal.     Pupils: Pupils are equal, round, and reactive to light.  Neck:     Musculoskeletal: Normal range of motion and neck supple.     Comments: No meningismus. Cardiovascular:     Rate and Rhythm: Normal rate and regular rhythm.     Heart sounds: Normal heart sounds. No murmur.  Pulmonary:     Effort: Pulmonary effort is normal. No respiratory distress.     Breath sounds: Normal breath sounds.  Chest:     Chest wall: No tenderness.  Abdominal:     Palpations: Abdomen is soft.     Tenderness: There is no abdominal tenderness. There is no guarding or rebound.  Musculoskeletal: Normal range of motion.         General: No tenderness.  Skin:    General: Skin is warm.     Capillary Refill: Capillary refill takes less than 2 seconds.  Neurological:     General: No focal deficit present.     Mental Status: She is alert and oriented to person, place, and time.     Cranial Nerves: No cranial nerve deficit.     Motor: No abnormal muscle tone.     Coordination: Coordination  normal.     Comments: No ataxia on finger to nose bilaterally. No pronator drift. 5/5 strength throughout. CN 2-12 intact.Equal grip strength. Sensation intact.   Psychiatric:        Behavior: Behavior normal.      ED Treatments / Results  Labs (all labs ordered are listed, but only abnormal results are displayed) Labs Reviewed  CBC WITH DIFFERENTIAL/PLATELET - Abnormal; Notable for the following components:      Result Value   Hemoglobin 11.2 (*)    MCH 24.6 (*)    All other components within normal limits  BASIC METABOLIC PANEL  TROPONIN I  D-DIMER, QUANTITATIVE (NOT AT [facility])  I-STAT BETA HCG BLOOD, ED ([facility], [facility], [facility] ONLY)    EKG EKG Interpretation  Date/Time:  Tuesday May 25 2018 01:35:07 EDT Ventricular Rate:  79 PR Interval:    QRS Duration: 98 QT Interval:  410 QTC Calculation: 470 R Axis:   43 Text Interpretation:  Sinus arrhythmia No previous ECGs available Confirmed by Glynn Octaveancour, Kien Mirsky [id number]) on 05/25/2018 7:35:42 AM   Radiology Dg Chest 2 View  Result Date: 05/25/2018 CLINICAL DATA:  Chest pain, shortness of breath and cough for 1 week. Orthopnea. EXAM: CHEST - 2 VIEW COMPARISON:  None. FINDINGS: Cardiac silhouette is mildly enlarged. Mediastinal silhouette is unremarkable. No pleural effusion or focal consolidation. No pneumothorax. Soft tissue planes and included osseous structures are nonacute. IMPRESSION: 1. Mild cardiomegaly.  No acute pulmonary process. Electronically Signed   By: Awilda Metroourtnay  Bloomer M.D.   On: 05/25/2018 02:33    Procedures Procedures (including critical care time)   Medications Ordered in ED Medications - No data to display   Initial Impression / Assessment and Plan / ED Course  I have reviewed the triage vital signs and the nursing notes.  Pertinent labs & imaging results that were available during my care of the patient were reviewed by me and considered in my medical decision making (see chart for details).       Intermittent chest pain for the past week.  Description is atypical for ACS.  Shortness of breath woke her from sleep but she is not hypoxic and not having any difficulty breathing now.  No cough or fever.  EKG shows sinus rhythm without acute ST changes.  Work-up is reassuring.  Troponin negative.  D-dimer negative.  Chest x-ray is negative.  Suspect atypical chest pain.  Low suspicion for ACS or PE.  Patient is concerned she could have coronavirus.  Discussed with her that she does not have any fever, shortness of breath or cough or known exposures.  Also discussed that we cannot rule out coronavirus completely so do recommend home quarantine if she is worried about that.  Patient states she is upset because her family continues to come and go about her usual activities when they are supposed to be staying at home.  Discussed with patient that she has no hypoxia or increased work of breathing and she does appear to be safe to go home. She does not meet criteria for corona virus testing. Advised home quarantine until she is symptom-free for 7 days.  Follow-up with PCP.  Return precautions discussed.  [name].Anwyn C Nicodemus was evaluated in Emergency Department on 05/25/2018 for the symptoms described in the history of present illness. She was evaluated in the context of the global COVID-19 pandemic, which necessitated consideration that the patient might be at risk for infection with the SARS-CoV-2 virus that causes COVID-19. Institutional protocols and algorithms that  pertain to the evaluation of patients at risk for COVID-19 are in a state of  rapid change based on information released by regulatory bodies including the CDC and federal and state organizations. These policies and algorithms were followed during the patient's care in the ED.    ED ECG REPORT   Date: 05/25/2018  Rate: 79  Rhythm: sinus arrhythmia  QRS Axis: normal  Intervals: normal  ST/T Wave abnormalities: normal  Conduction Disutrbances:none  Narrative Interpretation:   Old EKG Reviewed: none available  I have personally reviewed the EKG tracing and agree with the computerized printout as noted.  Final Clinical Impressions(s) / ED Diagnoses   Final diagnoses:  Atypical chest pain    ED Discharge Orders    None       Nadalee Neiswender, Jeannett Senior, MD 05/25/18 [phone]

## 2018-05-25 NOTE — ED Notes (Signed)
Reviewed d/c instructions with pt, who verbalized understanding and had no outstanding questions. Armband & pt labels removed and placed in shred bin. Pt departed in NAD, refused use of wheelchair.   

## 2018-05-25 NOTE — Discharge Instructions (Signed)
There is no evidence of heart attack or blood clot in the lung.  There is no pneumonia. If you are concerned about coronavirus you should quarantine yourself at home and to have no symptoms for 7 days.     Person Under Monitoring Name: Alyssa Hartman  Location: [address] [place] [address]   Infection Prevention Recommendations for Individuals Confirmed to have, or Being Evaluated for, 2019 Novel Coronavirus (COVID-19) Infection Who Receive Care at Home  Individuals who are confirmed to have, or are being evaluated for, COVID-19 should follow the prevention steps below until a healthcare provider or local or state health department says they can return to normal activities.  Stay home except to get medical care You should restrict activities outside your home, except for getting medical care. Do not go to work, school, or public areas, and do not use public transportation or taxis.  Call ahead before visiting your doctor Before your medical appointment, call the healthcare provider and tell them that you have, or are being evaluated for, COVID-19 infection. This will help the healthcare providers office take steps to keep other people from getting infected. Ask your healthcare provider to call the local or state health department.  Monitor your symptoms Seek prompt medical attention if your illness is worsening (e.g., difficulty breathing). Before going to your medical appointment, call the healthcare provider and tell them that you have, or are being evaluated for, COVID-19 infection. Ask your healthcare provider to call the local or state health department.  Wear a facemask You should wear a facemask that covers your nose and mouth when you are in the same room with other people and when you visit a healthcare provider. People who live with or visit you should also wear a facemask while they are in the same room with you.  Separate yourself from other people in your  home As much as possible, you should stay in a different room from other people in your home. Also, you should use a separate bathroom, if available.  Avoid sharing household items You should not share dishes, drinking glasses, cups, eating utensils, towels, bedding, or other items with other people in your home. After using these items, you should wash them thoroughly with soap and water.  Cover your coughs and sneezes Cover your mouth and nose with a tissue when you cough or sneeze, or you can cough or sneeze into your sleeve. Throw used tissues in a lined trash can, and immediately wash your hands with soap and water for at least 20 seconds or use an alcohol-based hand rub.  Wash your [employer] your hands often and thoroughly with soap and water for at least 20 seconds. You can use an alcohol-based hand sanitizer if soap and water are not available and if your hands are not visibly dirty. Avoid touching your eyes, nose, and mouth with unwashed hands.   Prevention Steps for Caregivers and Household Members of Individuals Confirmed to have, or Being Evaluated for, COVID-19 Infection Being Cared for in the Home  If you live with, or provide care at home for, a person confirmed to have, or being evaluated for, COVID-19 infection please follow these guidelines to prevent infection:  Follow healthcare providers instructions Make sure that you understand and can help the patient follow any healthcare provider instructions for all care.  Provide for the patients basic needs You should help the patient with basic needs in the home and provide support for getting groceries, prescriptions, and  other personal needs.  Monitor the patients symptoms If they are getting sicker, call his or her medical provider and tell them that the patient has, or is being evaluated for, COVID-19 infection. This will help the healthcare providers office take steps to keep other people from getting  infected. Ask the healthcare provider to call the local or state health department.  Limit the number of people who have contact with the patient If possible, have only one caregiver for the patient. Other household members should stay in another home or place of residence. If this is not possible, they should stay in another room, or be separated from the patient as much as possible. Use a separate bathroom, if available. Restrict visitors who do not have an essential need to be in the home.  Keep older adults, very young children, and other sick people away from the patient Keep older adults, very young children, and those who have compromised immune systems or chronic health conditions away from the patient. This includes people with chronic heart, lung, or kidney conditions, diabetes, and cancer.  Ensure good ventilation Make sure that shared spaces in the home have good air flow, such as from an air conditioner or an opened window, weather permitting.  Wash your hands often Wash your hands often and thoroughly with soap and water for at least 20 seconds. You can use an alcohol based hand sanitizer if soap and water are not available and if your hands are not visibly dirty. Avoid touching your eyes, nose, and mouth with unwashed hands. Use disposable paper towels to dry your hands. If not available, use dedicated cloth towels and replace them when they become wet.  Wear a facemask and gloves Wear a disposable facemask at all times in the room and gloves when you touch or have contact with the patients blood, body fluids, and/or secretions or excretions, such as sweat, saliva, sputum, nasal mucus, vomit, urine, or feces.  Ensure the mask fits over your nose and mouth tightly, and do not touch it during use. Throw out disposable facemasks and gloves after using them. Do not reuse. Wash your hands immediately after removing your facemask and gloves. If your personal clothing becomes  contaminated, carefully remove clothing and launder. Wash your hands after handling contaminated clothing. Place all used disposable facemasks, gloves, and other waste in a lined container before disposing them with other household waste. Remove gloves and wash your hands immediately after handling these items.  Do not share dishes, glasses, or other household items with the patient Avoid sharing household items. You should not share dishes, drinking glasses, cups, eating utensils, towels, bedding, or other items with a patient who is confirmed to have, or being evaluated for, COVID-19 infection. After the person uses these items, you should wash them thoroughly with soap and water.  Wash laundry thoroughly Immediately remove and wash clothes or bedding that have blood, body fluids, and/or secretions or excretions, such as sweat, saliva, sputum, nasal mucus, vomit, urine, or feces, on them. Wear gloves when handling laundry from the patient. Read and follow directions on labels of laundry or clothing items and detergent. In general, wash and dry with the warmest temperatures recommended on the label.  Clean all areas the individual has used often Clean all touchable surfaces, such as counters, tabletops, doorknobs, bathroom fixtures, toilets, phones, keyboards, tablets, and bedside tables, every day. Also, clean any surfaces that may have blood, body fluids, and/or secretions or excretions on them. Wear gloves when cleaning  surfaces the patient has come in contact with. Use a diluted bleach solution (e.g., dilute bleach with 1 part bleach and 10 parts water) or a household disinfectant with a label that says EPA-registered for coronaviruses. To make a bleach solution at home, add 1 tablespoon of bleach to 1 quart (4 cups) of water. For a larger supply, add  cup of bleach to 1 gallon (16 cups) of water. Read labels of cleaning products and follow recommendations provided on product labels. Labels  contain instructions for safe and effective use of the cleaning product including precautions you should take when applying the product, such as wearing gloves or eye protection and making sure you have good ventilation during use of the product. Remove gloves and wash hands immediately after cleaning.  Monitor yourself for signs and symptoms of illness Caregivers and household members are considered close contacts, should monitor their health, and will be asked to limit movement outside of the home to the extent possible. Follow the monitoring steps for close contacts listed on the symptom monitoring form.   ? If you have additional questions, contact your local health department or call the epidemiologist on call at [phone] (available 24/7). ? This guidance is subject to change. For the most up-to-date guidance from [facility], please refer to their website: YouBlogs.pl

## 2018-05-25 NOTE — ED Notes (Signed)
Patient transported to X-ray 

## 2019-01-04 ENCOUNTER — Other Ambulatory Visit: Payer: Self-pay

## 2019-01-04 ENCOUNTER — Encounter (INDEPENDENT_AMBULATORY_CARE_PROVIDER_SITE_OTHER): Payer: Self-pay | Admitting: Primary Care

## 2019-01-04 ENCOUNTER — Ambulatory Visit (INDEPENDENT_AMBULATORY_CARE_PROVIDER_SITE_OTHER): Payer: Medicaid Other | Admitting: Primary Care

## 2019-01-04 VITALS — BP 102/69 | HR 103 | Temp 97.5°F | Ht 62.0 in | Wt 246.4 lb

## 2019-01-04 DIAGNOSIS — Z131 Encounter for screening for diabetes mellitus: Secondary | ICD-10-CM | POA: Diagnosis not present

## 2019-01-04 DIAGNOSIS — E063 Autoimmune thyroiditis: Secondary | ICD-10-CM

## 2019-01-04 DIAGNOSIS — E038 Other specified hypothyroidism: Secondary | ICD-10-CM

## 2019-01-04 DIAGNOSIS — Z6841 Body Mass Index (BMI) 40.0 and over, adult: Secondary | ICD-10-CM

## 2019-01-04 DIAGNOSIS — Z7689 Persons encountering health services in other specified circumstances: Secondary | ICD-10-CM

## 2019-01-04 LAB — POCT GLYCOSYLATED HEMOGLOBIN (HGB A1C): Hemoglobin A1C: 5.4 % (ref 4.0–5.6)

## 2019-01-04 NOTE — Patient Instructions (Signed)
Hypothyroidism  Hypothyroidism is when the thyroid gland does not make enough of certain hormones (it is underactive). The thyroid gland is a small gland located in the lower front part of the neck, just in front of the windpipe (trachea). This gland makes hormones that help control how the body uses food for energy (metabolism) as well as how the heart and brain function. These hormones also play a role in keeping your bones strong. When the thyroid is underactive, it produces too little of the hormones thyroxine (T4) and triiodothyronine (T3). What are the causes? This condition may be caused by:  Hashimoto's disease. This is a disease in which the body's disease-fighting system (immune system) attacks the thyroid gland. This is the most common cause.  Viral infections.  Pregnancy.  Certain medicines.  Birth defects.  Past radiation treatments to the head or neck for cancer.  Past treatment with radioactive iodine.  Past exposure to radiation in the environment.  Past surgical removal of part or all of the thyroid.  Problems with a gland in the center of the brain (pituitary gland).  Lack of enough iodine in the diet. What increases the risk? You are more likely to develop this condition if:  You are female.  You have a family history of thyroid conditions.  You use a medicine called lithium.  You take medicines that affect the immune system (immunosuppressants). What are the signs or symptoms? Symptoms of this condition include:  Feeling as though you have no energy (lethargy).  Not being able to tolerate cold.  Weight gain that is not explained by a change in diet or exercise habits.  Lack of appetite.  Dry skin.  Coarse hair.  Menstrual irregularity.  Slowing of thought processes.  Constipation.  Sadness or depression. How is this diagnosed? This condition may be diagnosed based on:  Your symptoms, your medical history, and a physical exam.  Blood  tests. You may also have imaging tests, such as an ultrasound or MRI. How is this treated? This condition is treated with medicine that replaces the thyroid hormones that your body does not make. After you begin treatment, it may take several weeks for symptoms to go away. Follow these instructions at home:  Take over-the-counter and prescription medicines only as told by your health care provider.  If you start taking any new medicines, tell your health care provider.  Keep all follow-up visits as told by your health care provider. This is important. ? As your condition improves, your dosage of thyroid hormone medicine may change. ? You will need to have blood tests regularly so that your health care provider can monitor your condition. Contact a health care provider if:  Your symptoms do not get better with treatment.  You are taking thyroid replacement medicine and you: ? Sweat a lot. ? Have tremors. ? Feel anxious. ? Lose weight rapidly. ? Cannot tolerate heat. ? Have emotional swings. ? Have diarrhea. ? Feel weak. Get help right away if you have:  Chest pain.  An irregular heartbeat.  A rapid heartbeat.  Difficulty breathing. Summary  Hypothyroidism is when the thyroid gland does not make enough of certain hormones (it is underactive).  When the thyroid is underactive, it produces too little of the hormones thyroxine (T4) and triiodothyronine (T3).  The most common cause is Hashimoto's disease, a disease in which the body's disease-fighting system (immune system) attacks the thyroid gland. The condition can also be caused by viral infections, medicine, pregnancy, or past   radiation treatment to the head or neck.  Symptoms may include weight gain, dry skin, constipation, feeling as though you do not have energy, and not being able to tolerate cold.  This condition is treated with medicine to replace the thyroid hormones that your body does not make. This information  is not intended to replace advice given to you by your health care provider. Make sure you discuss any questions you have with your health care provider. Document Released: 02/10/2005 Document Revised: 01/23/2017 Document Reviewed: 01/21/2017 Elsevier Patient Education  2020 [employer].  

## 2019-01-04 NOTE — Progress Notes (Signed)
New Patient Office Visit  Subjective:  Patient ID: Alyssa Hartman, female    DOB: [birth year]  Age: 23 y.o. MRN: [id number]  CC:  Chief Complaint  Patient presents with  . New Patient (Initial Visit)    thyroid  . Urticaria    HPI Alyssa Hartman presents for establish care requesting thyroid medication refilled.   Past Medical History:  Diagnosis Date  . Carpal tunnel syndrome   . Hx of Hashimoto thyroiditis   . Hypothyroid     Past Surgical History:  Procedure Laterality Date  . NO PAST SURGERIES      Family History  Problem Relation Age of Onset  . Diabetes Maternal Grandmother   . Hypertension Maternal Grandmother     Social History   Socioeconomic History  . Marital status: Single    Spouse name: Not on file  . Number of children: Not on file  . Years of education: Not on file  . Highest education level: Not on file  Occupational History  . Not on file  Social Needs  . Financial resource strain: Not on file  . Food insecurity    Worry: Not on file    Inability: Not on file  . Transportation needs    Medical: Not on file    Non-medical: Not on file  Tobacco Use  . Smoking status: Former Smoker    Quit date: 03/18/2013    Years since quitting: 5.8  . Smokeless tobacco: Never Used  Substance and Sexual Activity  . Alcohol use: No  . Drug use: No  . Sexual activity: Not Currently    Birth control/protection: None  Lifestyle  . Physical activity    Days per week: Not on file    Minutes per session: Not on file  . Stress: Not on file  Relationships  . Social Herbalist on phone: Not on file    Gets together: Not on file    Attends religious service: Not on file    Active member of club or organization: Not on file    Attends meetings of clubs or organizations: Not on file    Relationship status: Not on file  . Intimate partner violence    Fear of current or ex partner: Not on file    Emotionally abused: Not on file   Physically abused: Not on file    Forced sexual activity: Not on file  Other Topics Concern  . Not on file  Social History Narrative  . Not on file    ROS Review of Systems  Constitutional: Positive for fatigue.  Cardiovascular: Positive for palpitations.  Gastrointestinal: Positive for nausea and vomiting.  Genitourinary: Positive for pelvic pain.  Neurological: Positive for weakness.  All other systems reviewed and are negative.   Objective:   Today's Vitals: BP 102/69 (BP Location: Left Arm, Patient Position: Sitting, Cuff Size: Large)   Pulse (!) 103   Temp (!) 97.5 F (36.4 C) (Temporal)   Ht 5\' 2"  (1.575 m)   Wt 246 lb 6.4 oz (111.8 kg)   LMP 12/17/2018 (Approximate)   SpO2 98%   Breastfeeding No   BMI 45.07 kg/m   Physical Exam Vitals signs reviewed.  Constitutional:      Appearance: Normal appearance. She is obese.     Comments: Morbid BMI >40   HENT:     Head: Normocephalic.     Right Ear: Tympanic membrane and external ear normal.     Left  Ear: Tympanic membrane and external ear normal.  Eyes:     Extraocular Movements: Extraocular movements intact.     Pupils: Pupils are equal, round, and reactive to light.  Neck:     Musculoskeletal: Normal range of motion and neck supple.  Cardiovascular:     Rate and Rhythm: Normal rate and regular rhythm.  Pulmonary:     Effort: Pulmonary effort is normal.     Breath sounds: Normal breath sounds.  Abdominal:     General: Bowel sounds are normal. There is distension.     Palpations: Abdomen is soft.  Musculoskeletal: Normal range of motion.  Skin:    General: Skin is warm and dry.  Neurological:     Mental Status: She is alert and oriented to person, place, and time.  Psychiatric:        Mood and Affect: Mood normal.        Behavior: Behavior normal.     Assessment & Plan:   Problem List Items Addressed This Visit      Alyssa Hartman was seen today for new patient (initial visit) and urticaria.  Diagnoses  and all orders for this visit:  Screening for diabetes mellitus -     HgB A1c 5.4  Encounter to establish care Gwinda Passe, NP-C will be your  (PCP) mastered prepared that is able to that will  diagnosed and treatment able to answer health concern as well as continuing care of varied medical conditions, not limited by cause, organ system, or diagnosis.   Hypothyroidism due to Hashimoto's thyroiditis Currently on no medication will check thyroid levels -     TSH + free T4  -     Morbid obesity (HCC) Morbid Obesity is BMI > 40  indicating an excess in caloric intake or underlining conditions. This may lead to other co-morbidities such as diabetes , CVD, and hypertension to name just a few. Recommend lifestyle modifications of diet and exercise may reduce weight.  Outpatient Encounter Medications as of 01/04/2019  Medication Sig  . [DISCONTINUED] acetaminophen (TYLENOL) 325 MG tablet Take 2 tablets (650 mg total) by mouth every 4 (four) hours as needed (breakthrough pain). (Patient not taking: Reported on 04/05/2018)  . [DISCONTINUED] calcium carbonate (TUMS - DOSED IN MG ELEMENTAL CALCIUM) 500 MG chewable tablet Chew 2 tablets by mouth at bedtime as needed for indigestion or heartburn.  . [DISCONTINUED] ibuprofen (ADVIL,MOTRIN) 800 MG tablet Take 1 tablet (800 mg total) by mouth 3 (three) times daily. (Patient not taking: Reported on 04/05/2018)  . [DISCONTINUED] Prenatal Vit-Fe Fumarate-FA (PRENATAL MULTIVITAMIN) TABS tablet Take 1 tablet by mouth daily at 12 noon. (Patient not taking: Reported on 04/05/2018)  . [DISCONTINUED] senna-docusate (SENOKOT-S) 8.6-50 MG tablet Take 2 tablets by mouth daily. (Patient not taking: Reported on 04/05/2018)   No facility-administered encounter medications on file as of 01/04/2019.     Follow-up: Return in about 3 months (around 04/06/2019) for Thyroid .   Alyssa Sessions, NP

## 2019-01-04 NOTE — Progress Notes (Signed)
Pt is losing weight Pt feels nauseas  Pt gets re-occurring stomach aches/pains

## 2019-01-05 LAB — CBC WITH DIFFERENTIAL/PLATELET
Basophils Absolute: 0 10*3/uL (ref 0.0–0.2)
Basos: 0 %
EOS (ABSOLUTE): 0.1 10*3/uL (ref 0.0–0.4)
Eos: 2 %
Hematocrit: 37.4 % (ref 34.0–46.6)
Hemoglobin: 11.7 g/dL (ref 11.1–15.9)
Immature Grans (Abs): 0 10*3/uL (ref 0.0–0.1)
Immature Granulocytes: 0 %
Lymphocytes Absolute: 1.9 10*3/uL (ref 0.7–3.1)
Lymphs: 23 %
MCH: 24.6 pg — ABNORMAL LOW (ref 26.6–33.0)
MCHC: 31.3 g/dL — ABNORMAL LOW (ref 31.5–35.7)
MCV: 79 fL (ref 79–97)
Monocytes Absolute: 0.6 10*3/uL (ref 0.1–0.9)
Monocytes: 7 %
Neutrophils Absolute: 5.5 10*3/uL (ref 1.4–7.0)
Neutrophils: 68 %
Platelets: 281 10*3/uL (ref 150–450)
RBC: 4.76 x10E6/uL (ref 3.77–5.28)
RDW: 13.7 % (ref 11.7–15.4)
WBC: 8.2 10*3/uL (ref 3.4–10.8)

## 2019-01-05 LAB — CMP14+EGFR
ALT: 10 IU/L (ref 0–32)
AST: 17 IU/L (ref 0–40)
Albumin/Globulin Ratio: 1.4 (ref 1.2–2.2)
Albumin: 4.2 g/dL (ref 3.9–5.0)
Alkaline Phosphatase: 82 IU/L (ref 39–117)
BUN/Creatinine Ratio: 14 (ref 9–23)
BUN: 10 mg/dL (ref 6–20)
Bilirubin Total: <0.2 mg/dL (ref 0.0–1.2)
CO2: 25 mmol/L (ref 20–29)
Calcium: 9.1 mg/dL (ref 8.7–10.2)
Chloride: 104 mmol/L (ref 96–106)
Creatinine, Ser: 0.7 mg/dL (ref 0.57–1.00)
GFR calc Af Amer: 141 mL/min/{1.73_m2} (ref >59)
GFR calc non Af Amer: 123 mL/min/{1.73_m2} (ref >59)
Globulin, Total: 3 g/dL (ref 1.5–4.5)
Glucose: 111 mg/dL — ABNORMAL HIGH (ref 65–99)
Potassium: 3.9 mmol/L (ref 3.5–5.2)
Sodium: 142 mmol/L (ref 134–144)
Total Protein: 7.2 g/dL (ref 6.0–8.5)

## 2019-01-05 LAB — TSH+FREE T4
Free T4: 0.96 ng/dL (ref 0.82–1.77)
TSH: 4.39 u[IU]/mL (ref 0.450–4.500)

## 2019-01-06 ENCOUNTER — Telehealth (INDEPENDENT_AMBULATORY_CARE_PROVIDER_SITE_OTHER): Payer: Self-pay

## 2019-01-06 NOTE — Telephone Encounter (Signed)
-----   Message from Kerin Perna, NP sent at 01/05/2019 11:05 PM EST ----- I have reviewed all labs and they are normal

## 2019-01-06 NOTE — Telephone Encounter (Signed)
Left message informing patient that labs were all normal. Return call to [facility] at [phone] with any questions or concerns. Nat Christen, CMA

## 2019-01-25 ENCOUNTER — Ambulatory Visit (INDEPENDENT_AMBULATORY_CARE_PROVIDER_SITE_OTHER): Payer: Medicaid Other | Admitting: Primary Care

## 2019-04-05 ENCOUNTER — Encounter (INDEPENDENT_AMBULATORY_CARE_PROVIDER_SITE_OTHER): Payer: Self-pay | Admitting: Primary Care

## 2019-04-05 ENCOUNTER — Ambulatory Visit (INDEPENDENT_AMBULATORY_CARE_PROVIDER_SITE_OTHER): Payer: Medicaid Other | Admitting: Primary Care

## 2019-04-05 ENCOUNTER — Ambulatory Visit (INDEPENDENT_AMBULATORY_CARE_PROVIDER_SITE_OTHER): Payer: Medicaid Other | Admitting: Licensed Clinical Social Worker

## 2019-04-05 ENCOUNTER — Other Ambulatory Visit: Payer: Self-pay

## 2019-04-05 VITALS — BP 121/82 | HR 92 | Temp 97.5°F | Ht 62.0 in | Wt 242.6 lb

## 2019-04-05 DIAGNOSIS — F411 Generalized anxiety disorder: Secondary | ICD-10-CM

## 2019-04-05 DIAGNOSIS — Z8632 Personal history of gestational diabetes: Secondary | ICD-10-CM

## 2019-04-05 DIAGNOSIS — Z3202 Encounter for pregnancy test, result negative: Secondary | ICD-10-CM | POA: Diagnosis not present

## 2019-04-05 DIAGNOSIS — Z7251 High risk heterosexual behavior: Secondary | ICD-10-CM

## 2019-04-05 DIAGNOSIS — Z23 Encounter for immunization: Secondary | ICD-10-CM

## 2019-04-05 DIAGNOSIS — R002 Palpitations: Secondary | ICD-10-CM | POA: Diagnosis not present

## 2019-04-05 DIAGNOSIS — Z3009 Encounter for other general counseling and advice on contraception: Secondary | ICD-10-CM

## 2019-04-05 DIAGNOSIS — F4323 Adjustment disorder with mixed anxiety and depressed mood: Secondary | ICD-10-CM

## 2019-04-05 DIAGNOSIS — O24410 Gestational diabetes mellitus in pregnancy, diet controlled: Secondary | ICD-10-CM

## 2019-04-05 LAB — POCT GLYCOSYLATED HEMOGLOBIN (HGB A1C): Hemoglobin A1C: 5.5 % (ref 4.0–5.6)

## 2019-04-05 LAB — POCT URINE PREGNANCY: Preg Test, Ur: NEGATIVE

## 2019-04-05 NOTE — Progress Notes (Signed)
Established Patient Office Visit  Subjective:  Patient ID: Alyssa Hartman, female    DOB: [birth year]  Age: 24 y.o. MRN: [id number]  CC:  Chief Complaint  Patient presents with  . Irregular Heart Beat    since sept/oct 2020  . Nausea  . Pain    fingertips, toes and back   . Fatigue    HPI [name] presents for acute visits complains of heart doesn't feel right has a history of irregular heart beat , also complain of nausea and unable to eat LMP January towards the end. Asked how can you be pregnant with a cycle explains some women have cycles during there entire pregnancy. Also, complains of numbness and tingling in fingers and toes. History of gestational diabetes.  Past Medical History:  Diagnosis Date  . Carpal tunnel syndrome   . Hx of Hashimoto thyroiditis   . Hypothyroid     Past Surgical History:  Procedure Laterality Date  . NO PAST SURGERIES      Family History  Problem Relation Age of Onset  . Diabetes Maternal Grandmother   . Hypertension Maternal Grandmother     Social History   Socioeconomic History  . Marital status: Single    Spouse name: Not on file  . Number of children: Not on file  . Years of education: Not on file  . Highest education level: Not on file  Occupational History  . Not on file  Tobacco Use  . Smoking status: Former Smoker    Quit date: 03/18/2013    Years since quitting: 6.0  . Smokeless tobacco: Never Used  Substance and Sexual Activity  . Alcohol use: No  . Drug use: No  . Sexual activity: Not Currently    Birth control/protection: None  Other Topics Concern  . Not on file  Social History Narrative  . Not on file   Social Determinants of Health   Financial Resource Strain:   . Difficulty of Paying Living Expenses: Not on file  Food Insecurity:   . Worried About Programme researcher, broadcasting/film/video in the Last Year: Not on file  . Ran Out of Food in the Last Year: Not on file  Transportation Needs:   . Lack of  Transportation (Medical): Not on file  . Lack of Transportation (Non-Medical): Not on file  Physical Activity:   . Days of Exercise per Week: Not on file  . Minutes of Exercise per Session: Not on file  Stress:   . Feeling of Stress : Not on file  Social Connections:   . Frequency of Communication with Friends and Family: Not on file  . Frequency of Social Gatherings with Friends and Family: Not on file  . Attends Religious Services: Not on file  . Active Member of Clubs or Organizations: Not on file  . Attends Banker Meetings: Not on file  . Marital Status: Not on file  Intimate Partner Violence:   . Fear of Current or Ex-Partner: Not on file  . Emotionally Abused: Not on file  . Physically Abused: Not on file  . Sexually Abused: Not on file    No outpatient medications prior to visit.   No facility-administered medications prior to visit.    No Known Allergies  ROS Review of Systems  Cardiovascular: Positive for palpitations.  Psychiatric/Behavioral: The patient is nervous/anxious.   All other systems reviewed and are negative.     Objective:    Physical Exam  Constitutional: She is oriented  to person, place, and time. She appears well-developed and well-nourished.  Morbid obesity  HENT:  Head: Normocephalic.  Eyes: Pupils are equal, round, and reactive to light. EOM are normal.  Cardiovascular: Normal rate and regular rhythm.  Abdominal: Soft. Bowel sounds are normal. She exhibits distension.  Musculoskeletal:        General: Normal range of motion.     Cervical back: Normal range of motion and neck supple.  Neurological: She is oriented to person, place, and time. She has normal reflexes.  Skin: Skin is warm and dry.  Psychiatric: She has a normal mood and affect. Her behavior is normal. Judgment and thought content normal.    BP 121/82 (BP Location: Left Arm, Patient Position: Sitting, Cuff Size: Large)   Pulse 92   Temp (!) 97.5 F (36.4 C)  (Temporal)   Ht 5\' 2"  (1.575 m)   Wt 242 lb 9.6 oz (110 kg)   LMP 03/21/2019 (Approximate)   SpO2 94%   Breastfeeding No   BMI 44.37 kg/m  Wt Readings from Last 3 Encounters:  04/05/19 242 lb 9.6 oz (110 kg)  01/04/19 246 lb 6.4 oz (111.8 kg)  05/25/18 233 lb (105.7 kg)     Health Maintenance Due  Topic Date Due  . PAP-Cervical Cytology Screening  08/31/2016  . PAP SMEAR-Modifier  08/31/2016  . INFLUENZA VACCINE  09/25/2018    There are no preventive care reminders to display for this patient.  Lab Results  Component Value Date   TSH 4.390 01/04/2019   Lab Results  Component Value Date   WBC 8.2 01/04/2019   HGB 11.7 01/04/2019   HCT 37.4 01/04/2019   MCV 79 01/04/2019   PLT 281 01/04/2019   Lab Results  Component Value Date   NA 142 01/04/2019   K 3.9 01/04/2019   CO2 25 01/04/2019   GLUCOSE 111 (H) 01/04/2019   BUN 10 01/04/2019   CREATININE 0.70 01/04/2019   BILITOT <0.2 01/04/2019   ALKPHOS 82 01/04/2019   AST 17 01/04/2019   ALT 10 01/04/2019   PROT 7.2 01/04/2019   ALBUMIN 4.2 01/04/2019   CALCIUM 9.1 01/04/2019   ANIONGAP 9 05/25/2018   No results found for: CHOL No results found for: HDL No results found for: LDLCALC No results found for: TRIG No results found for: CHOLHDL Lab Results  Component Value Date   HGBA1C 5.5 04/05/2019      Assessment & Plan:  Nyeemah was seen today for irregular heart beat, nausea, pain and fatigue.  Diagnoses and all orders for this visit:  Diet controlled gestational diabetes mellitus (GDM) in third trimester Complaints of neuropathy  in hands and feet not related to diabetes  -     HgB A1c 5.4  Unprotected sex/Birth control counseling - POCT urine pregnancy negative  discussed  all forms of birth control options available including abstinence; over the counter/barrier methods; hormonal contraceptive medication including pill, patch, ring, injection,contraceptive implant; hormonal and nonhormonal IUDs;  permanent sterilization options including vasectomy and the various tubal sterilization modalities. Risks and benefits reviewed. Questions were answered.  Information was given to patient to review. Not interested at this time    Palpitations -     EKG 12-Lead normal   Adjustment disorder with mixed anxiety and depressed mood Explained all test were normal was she having problems she needed to discuss stated yes . CSW will see her at this visit .  Follow-up: Return for schedule pap.    Michelle P  Oletta Lamas, NP

## 2019-04-05 NOTE — Patient Instructions (Signed)

## 2019-04-15 ENCOUNTER — Ambulatory Visit (INDEPENDENT_AMBULATORY_CARE_PROVIDER_SITE_OTHER): Payer: Medicaid Other | Admitting: Primary Care

## 2019-04-15 NOTE — BH Specialist Note (Signed)
Integrated Behavioral Health Initial Visit  MRN: [id number] Name: Alyssa Hartman  Number of Integrated Behavioral Health Clinician visits:: 1/6 Session Start time: 10:15 AM  Session End time: 10:45 AM Total time: 30  Type of Service: Integrated Behavioral Health- Individual Interpretor:No. Interpretor Name and Language: NA   Warm Hand Off Completed.       SUBJECTIVE: Alyssa Hartman is a 24 y.o. female accompanied by self Patient was referred by NP Randa Evens for anxiety. Patient reports the following symptoms/concerns: Pt reports concern for different medical conditions triggered by tingling in extremities and chest discomfort Duration of problem: 1 month; Severity of problem: moderate  OBJECTIVE: Mood: Anxious and Pleasant and Affect: Appropriate Risk of harm to self or others: No plan to harm self or others  LIFE CONTEXT: Family and Social: Pt receives support from family School/Work: Pt has medicaid Self-Care: Pt talks with family about stressors Life Changes: Pt reports stressors regarding health  GOALS ADDRESSED: Patient will: 1. Reduce symptoms of: anxiety and stress 2. Increase knowledge and/or ability of: coping skills  3. Demonstrate ability to: Increase healthy adjustment to current life circumstances and Increase adequate support systems for patient/family  INTERVENTIONS: Interventions utilized: Mindfulness or Management consultant, Supportive Counseling and Psychoeducation and/or Health Education  Standardized Assessments completed: GAD-7 and PHQ 2&9  ASSESSMENT: Patient currently experiencing anxiety symptoms triggered by stress and concern regarding medical health.    Patient may benefit from brief therapy. LCSW provided support and validation. Healthy coping skills to assist in management of stress were discussed.   PLAN: 1. Follow up with behavioral health clinician on : Contact LCSW with additional concerns for behavioral health or resource  needs 2. Behavioral recommendations: Utilize strategies discussed 3. Referral(s): Integrated Behavioral Health Services (In Clinic) 4. "From scale of 1-10, how likely are you to follow plan?":   Bridgett Larsson, LCSW 04/15/2019 11:08 AM

## 2019-04-22 ENCOUNTER — Encounter (INDEPENDENT_AMBULATORY_CARE_PROVIDER_SITE_OTHER): Payer: Self-pay | Admitting: Primary Care

## 2019-04-22 ENCOUNTER — Other Ambulatory Visit (HOSPITAL_COMMUNITY)
Admission: RE | Admit: 2019-04-22 | Discharge: 2019-04-22 | Disposition: A | Discharge status: Home / Self Care | Payer: Medicaid Other | Source: Ambulatory Visit | Attending: Primary Care | Admitting: Primary Care

## 2019-04-22 ENCOUNTER — Other Ambulatory Visit: Payer: Self-pay

## 2019-04-22 ENCOUNTER — Ambulatory Visit (INDEPENDENT_AMBULATORY_CARE_PROVIDER_SITE_OTHER): Payer: Medicaid Other | Admitting: Primary Care

## 2019-04-22 VITALS — BP 113/80 | HR 82 | Temp 97.3°F | Ht 62.0 in | Wt 245.4 lb

## 2019-04-22 DIAGNOSIS — N898 Other specified noninflammatory disorders of vagina: Secondary | ICD-10-CM | POA: Diagnosis not present

## 2019-04-22 DIAGNOSIS — Z124 Encounter for screening for malignant neoplasm of cervix: Secondary | ICD-10-CM | POA: Insufficient documentation

## 2019-04-22 DIAGNOSIS — Z9189 Other specified personal risk factors, not elsewhere classified: Secondary | ICD-10-CM | POA: Insufficient documentation

## 2019-04-22 DIAGNOSIS — Z7189 Other specified counseling: Secondary | ICD-10-CM | POA: Diagnosis not present

## 2019-04-22 NOTE — Progress Notes (Signed)
Established Patient Office Visit  Subjective:  Patient ID: Alyssa Hartman, female    DOB: 1996/01/08  Age: 24 y.o. MRN: [id number]  CC:  Chief Complaint  Patient presents with  . Gynecologic Exam    HPI [name] presents for having a cervical cancer screening. Sexually active on no birth control or use of condoms.  Past Medical History:  Diagnosis Date  . Carpal tunnel syndrome   . Hx of Hashimoto thyroiditis   . Hypothyroid     Past Surgical History:  Procedure Laterality Date  . NO PAST SURGERIES      Family History  Problem Relation Age of Onset  . Diabetes Maternal Grandmother   . Hypertension Maternal Grandmother     Social History   Socioeconomic History  . Marital status: Single    Spouse name: Not on file  . Number of children: Not on file  . Years of education: Not on file  . Highest education level: Not on file  Occupational History  . Not on file  Tobacco Use  . Smoking status: Former Smoker    Quit date: 03/18/2013    Years since quitting: 6.0  . Smokeless tobacco: Never Used  Substance and Sexual Activity  . Alcohol use: No  . Drug use: No  . Sexual activity: Not Currently    Birth control/protection: None  Other Topics Concern  . Not on file  Social History Narrative  . Not on file   Social Determinants of Health   Financial Resource Strain:   . Difficulty of Paying Living Expenses: Not on file  Food Insecurity:   . Worried About Charity fundraiser in the Last Year: Not on file  . Ran Out of Food in the Last Year: Not on file  Transportation Needs:   . Lack of Transportation (Medical): Not on file  . Lack of Transportation (Non-Medical): Not on file  Physical Activity:   . Days of Exercise per Week: Not on file  . Minutes of Exercise per Session: Not on file  Stress:   . Feeling of Stress : Not on file  Social Connections:   . Frequency of Communication with Friends and Family: Not on file  . Frequency of Social  Gatherings with Friends and Family: Not on file  . Attends Religious Services: Not on file  . Active Member of Clubs or Organizations: Not on file  . Attends Archivist Meetings: Not on file  . Marital Status: Not on file  Intimate Partner Violence:   . Fear of Current or Ex-Partner: Not on file  . Emotionally Abused: Not on file  . Physically Abused: Not on file  . Sexually Abused: Not on file    No outpatient medications prior to visit.   No facility-administered medications prior to visit.    No Known Allergies  ROS Review of Systems  Genitourinary: Positive for vaginal discharge.  All other systems reviewed and are negative.     Objective:    Physical Exam CONSTITUTIONAL: Well-developed, well-nourished female morbid obesed  in no acute distress.  HENT:  Normocephalic, atraumatic, External right and left ear normal. Oropharynx is clear and moist EYES: Conjunctivae and EOM are normal. Pupils are equal, round, and reactive to light. No scleral icterus.  NECK: Normal range of motion, supple, no masses.  Normal thyroid.  SKIN: Skin is warm and dry. No rash noted. Not diaphoretic. No erythema. No pallor. [place]: Alert and oriented to person, place, and time.  Normal reflexes, muscle tone coordination. No cranial nerve deficit noted. PSYCHIATRIC: Normal mood and affect. Normal behavior. Normal judgment and thought content. CARDIOVASCULAR: Normal heart rate noted, regular rhythm RESPIRATORY: Clear to auscultation bilaterally. Effort and breath sounds normal, no problems with respiration noted. BREASTS: Taught SBE ABDOMEN: Soft, normal bowel sounds, no distention noted.  No tenderness, rebound or guarding.  PELVIC: Normal appearing external genitalia; normal appearing vaginal mucosa and cervix.  No abnormal discharge noted.  Pap smear obtained.  Normal uterine size, no other palpable masses, no uterine or adnexal tenderness. MUSCULOSKELETAL: Normal range of motion. No  tenderness.  No cyanosis, clubbing, or edema.  2+ distal pulses. BP 113/80 (BP Location: Right Arm, Patient Position: Sitting, Cuff Size: Large)   Pulse 82   Temp (!) 97.3 F (36.3 C) (Temporal)   Ht 5\' 2"  (1.575 m)   Wt 245 lb 6.4 oz (111.3 kg)   LMP 03/21/2019 (Exact Date)   SpO2 97%   Breastfeeding No   BMI 44.88 kg/m  Wt Readings from Last 3 Encounters:  04/22/19 245 lb 6.4 oz (111.3 kg)  04/05/19 242 lb 9.6 oz (110 kg)  01/04/19 246 lb 6.4 oz (111.8 kg)     Health Maintenance Due  Topic Date Due  . PAP-Cervical Cytology Screening  08/31/2016  . PAP SMEAR-Modifier  08/31/2016    There are no preventive care reminders to display for this patient.  Lab Results  Component Value Date   TSH 4.390 01/04/2019   Lab Results  Component Value Date   WBC 8.2 01/04/2019   HGB 11.7 01/04/2019   HCT 37.4 01/04/2019   MCV 79 01/04/2019   PLT 281 01/04/2019   Lab Results  Component Value Date   NA 142 01/04/2019   K 3.9 01/04/2019   CO2 25 01/04/2019   GLUCOSE 111 (H) 01/04/2019   BUN 10 01/04/2019   CREATININE 0.70 01/04/2019   BILITOT <0.2 01/04/2019   ALKPHOS 82 01/04/2019   AST 17 01/04/2019   ALT 10 01/04/2019   PROT 7.2 01/04/2019   ALBUMIN 4.2 01/04/2019   CALCIUM 9.1 01/04/2019   ANIONGAP 9 05/25/2018   No results found for: CHOL No results found for: HDL No results found for: LDLCALC No results found for: TRIG No results found for: CHOLHDL Lab Results  Component Value Date   HGBA1C 5.5 04/05/2019      Assessment & Plan:  Maricella was seen today for gynecologic exam.  Diagnoses and all orders for this visit:  Encounter for Papanicolaou smear for cervical cancer screening -     Cytology - PAP([facility])  Vaginal discharge -     Cervicovaginal ancillary only  At risk for sexually transmitted disease due to unprotected sex -     Cervicovaginal ancillary only Pt counseled regarding condom use with each sexual activity to promote wellness and  prevention of transmission of HIV, syphilis, herpes simplex virus, gonorrhea, chlamydia and trichomoniasis..  Reliable web sites such as Raylene Everts and thebody.com were given to patient since  preferences for learning is the Internet.   Breast self examination education, encounter for Only [employer] taught how to perform SBE illustration and what cyst feel like and how to describe location with clock illustration    Problem List Items Addressed This Visit    None      No orders of the defined types were placed in this encounter.   Follow-up: No follow-ups on file.    [email or web address], NP

## 2019-04-22 NOTE — Patient Instructions (Signed)
Health Maintenance, Female Adopting a healthy lifestyle and getting preventive care are important in promoting health and wellness. Ask your health care provider about:  The right schedule for you to have regular tests and exams.  Things you can do on your own to prevent diseases and keep yourself healthy. What should I know about diet, weight, and exercise? Eat a healthy diet   Eat a diet that includes plenty of vegetables, fruits, low-fat dairy products, and lean protein.  Do not eat a lot of foods that are high in solid fats, added sugars, or sodium. Maintain a healthy weight Body mass index (BMI) is used to identify weight problems. It estimates body fat based on height and weight. Your health care provider can help determine your BMI and help you achieve or maintain a healthy weight. Get regular exercise Get regular exercise. This is one of the most important things you can do for your health. Most adults should:  Exercise for at least 150 minutes each week. The exercise should increase your heart rate and make you sweat (moderate-intensity exercise).  Do strengthening exercises at least twice a week. This is in addition to the moderate-intensity exercise.  Spend less time sitting. Even light physical activity can be beneficial. Watch cholesterol and blood lipids Have your blood tested for lipids and cholesterol at 24 years of age, then have this test every 5 years. Have your cholesterol levels checked more often if:  Your lipid or cholesterol levels are high.  You are older than 24 years of age.  You are at high risk for heart disease. What should I know about cancer screening? Depending on your health history and family history, you may need to have cancer screening at various ages. This may include screening for:  Breast cancer.  Cervical cancer.  Colorectal cancer.  Skin cancer.  Lung cancer. What should I know about heart disease, diabetes, and high blood  pressure? Blood pressure and heart disease  High blood pressure causes heart disease and increases the risk of stroke. This is more likely to develop in people who have high blood pressure readings, are of African descent, or are overweight.  Have your blood pressure checked: ? Every 3-5 years if you are 18-39 years of age. ? Every year if you are 40 years old or older. Diabetes Have regular diabetes screenings. This checks your fasting blood sugar level. Have the screening done:  Once every three years after age 40 if you are at a normal weight and have a low risk for diabetes.  More often and at a younger age if you are overweight or have a high risk for diabetes. What should I know about preventing infection? Hepatitis B If you have a higher risk for hepatitis B, you should be screened for this virus. Talk with your health care provider to find out if you are at risk for hepatitis B infection. Hepatitis C Testing is recommended for:  Everyone born from 1945 through 1965.  Anyone with known risk factors for hepatitis C. Sexually transmitted infections (STIs)  Get screened for STIs, including gonorrhea and chlamydia, if: ? You are sexually active and are younger than 24 years of age. ? You are older than 24 years of age and your health care provider tells you that you are at risk for this type of infection. ? Your sexual activity has changed since you were last screened, and you are at increased risk for chlamydia or gonorrhea. Ask your health care provider if   you are at risk.  Ask your health care provider about whether you are at high risk for HIV. Your health care provider may recommend a prescription medicine to help prevent HIV infection. If you choose to take medicine to prevent HIV, you should first get tested for HIV. You should then be tested every 3 months for as long as you are taking the medicine. Pregnancy  If you are about to stop having your period (premenopausal) and  you may become pregnant, seek counseling before you get pregnant.  Take 400 to 800 micrograms (mcg) of folic acid every day if you become pregnant.  Ask for birth control (contraception) if you want to prevent pregnancy. Osteoporosis and menopause Osteoporosis is a disease in which the bones lose minerals and strength with aging. This can result in bone fractures. If you are 65 years old or older, or if you are at risk for osteoporosis and fractures, ask your health care provider if you should:  Be screened for bone loss.  Take a calcium or vitamin D supplement to lower your risk of fractures.  Be given hormone replacement therapy (HRT) to treat symptoms of menopause. Follow these instructions at home: Lifestyle  Do not use any products that contain nicotine or tobacco, such as cigarettes, e-cigarettes, and chewing tobacco. If you need help quitting, ask your health care provider.  Do not use street drugs.  Do not share needles.  Ask your health care provider for help if you need support or information about quitting drugs. Alcohol use  Do not drink alcohol if: ? Your health care provider tells you not to drink. ? You are pregnant, may be pregnant, or are planning to become pregnant.  If you drink alcohol: ? Limit how much you use to 0-1 drink a day. ? Limit intake if you are breastfeeding.  Be aware of how much alcohol is in your drink. In the [place] one drink equals one 12 oz bottle of beer (355 mL), one 5 oz glass of wine (148 mL), or one 1 oz glass of hard liquor (44 mL). General instructions  Schedule regular health, dental, and eye exams.  Stay current with your vaccines.  Tell your health care provider if: ? You often feel depressed. ? You have ever been abused or do not feel safe at home. Summary  Adopting a healthy lifestyle and getting preventive care are important in promoting health and wellness.  Follow your health care provider's instructions about healthy  diet, exercising, and getting tested or screened for diseases.  Follow your health care provider's instructions on monitoring your cholesterol and blood pressure. This information is not intended to replace advice given to you by your health care provider. Make sure you discuss any questions you have with your health care provider. Document Revised: 02/03/2018 Document Reviewed: 02/03/2018 Elsevier Patient Education  2020 Elsevier Inc.  

## 2019-04-25 ENCOUNTER — Telehealth (INDEPENDENT_AMBULATORY_CARE_PROVIDER_SITE_OTHER): Payer: Self-pay

## 2019-04-25 LAB — CERVICOVAGINAL ANCILLARY ONLY
Bacterial Vaginitis (gardnerella): POSITIVE — AB
Candida Glabrata: NEGATIVE
Candida Vaginitis: NEGATIVE
Chlamydia: NEGATIVE
Comment: NEGATIVE
Comment: NEGATIVE
Comment: NEGATIVE
Comment: NEGATIVE
Comment: NEGATIVE
Comment: NORMAL
Neisseria Gonorrhea: NEGATIVE
Trichomonas: NEGATIVE

## 2019-04-25 LAB — CYTOLOGY - PAP: Diagnosis: NEGATIVE

## 2019-04-25 NOTE — Telephone Encounter (Signed)
Patient verified date of birth. She is aware that her pap is normal. Alyssa Hartman, CMA

## 2019-04-25 NOTE — Telephone Encounter (Signed)
-----   Message from Grayce Sessions, NP sent at 04/25/2019 10:37 AM EST ----- Pap results are normal repeat in 3 years

## 2019-04-26 ENCOUNTER — Telehealth (INDEPENDENT_AMBULATORY_CARE_PROVIDER_SITE_OTHER): Payer: Self-pay

## 2019-04-26 ENCOUNTER — Other Ambulatory Visit (INDEPENDENT_AMBULATORY_CARE_PROVIDER_SITE_OTHER): Payer: Self-pay | Admitting: Primary Care

## 2019-04-26 MED ORDER — METRONIDAZOLE 500 MG PO TABS: 500.0000 mg | ORAL_TABLET | Freq: Two times a day (BID) | ORAL | 0 refills | Status: DC

## 2019-04-26 NOTE — Telephone Encounter (Signed)
Patient verified date of birth. She is aware of positive BV and metronidazole being sent to pharmacy. She verbalized understanding. Maryjean Morn, CMA

## 2019-04-26 NOTE — Telephone Encounter (Signed)
-----   Message from Grayce Sessions, NP sent at 04/26/2019  1:40 PM EST ----- Pap was normal will need to repeat in 3 years. You do have Bacterial Vaginosis. A prescription for metronidazole. You take this medication twice a day for 7 days. Be sure that you do not drink alcohol when you take this medication because the combination can give you severe nausea and vomiting.  It can sometimes give people a metallic taste in their mouth while they are taking it. When you take antibiotics, it can wipe out your gut flora.  This can cause problems like diarrhea.  It would be helpful to restore your gut flora and potentially decrease the side effects if you were to take either an over-the-counter probiotic such as [employer], Sport and exercise psychologist or [employer].  I would recommend taking this on a daily basis for at least 3-4 weeks.

## 2019-06-04 ENCOUNTER — Encounter (HOSPITAL_COMMUNITY): Payer: Self-pay | Admitting: Emergency Medicine

## 2019-06-04 ENCOUNTER — Emergency Department (HOSPITAL_COMMUNITY)
Admission: EM | Admit: 2019-06-04 | Discharge: 2019-06-04 | Disposition: A | Discharge status: Home / Self Care | Payer: Medicaid Other | Attending: Emergency Medicine | Admitting: Emergency Medicine

## 2019-06-04 ENCOUNTER — Other Ambulatory Visit: Payer: Self-pay

## 2019-06-04 ENCOUNTER — Emergency Department (HOSPITAL_COMMUNITY): Payer: Medicaid Other

## 2019-06-04 DIAGNOSIS — N760 Acute vaginitis: Secondary | ICD-10-CM | POA: Insufficient documentation

## 2019-06-04 DIAGNOSIS — B9689 Other specified bacterial agents as the cause of diseases classified elsewhere: Secondary | ICD-10-CM | POA: Diagnosis not present

## 2019-06-04 DIAGNOSIS — R102 Pelvic and perineal pain: Secondary | ICD-10-CM | POA: Diagnosis not present

## 2019-06-04 DIAGNOSIS — R238 Other skin changes: Secondary | ICD-10-CM

## 2019-06-04 DIAGNOSIS — N898 Other specified noninflammatory disorders of vagina: Secondary | ICD-10-CM | POA: Diagnosis present

## 2019-06-04 DIAGNOSIS — N730 Acute parametritis and pelvic cellulitis: Secondary | ICD-10-CM | POA: Insufficient documentation

## 2019-06-04 DIAGNOSIS — Z87891 Personal history of nicotine dependence: Secondary | ICD-10-CM | POA: Insufficient documentation

## 2019-06-04 LAB — URINALYSIS, ROUTINE W REFLEX MICROSCOPIC
Bacteria, UA: NONE SEEN
Bilirubin Urine: NEGATIVE
Glucose, UA: NEGATIVE mg/dL
Ketones, ur: 5 mg/dL — AB
Nitrite: NEGATIVE
Protein, ur: 100 mg/dL — AB
Specific Gravity, Urine: 1.031 — ABNORMAL HIGH (ref 1.005–1.030)
WBC, UA: >50 WBC/hpf — ABNORMAL HIGH (ref 0–5)
pH: 5 (ref 5.0–8.0)

## 2019-06-04 LAB — HIV ANTIBODY (ROUTINE TESTING W REFLEX): HIV Screen 4th Generation wRfx: NONREACTIVE

## 2019-06-04 LAB — WET PREP, GENITAL
Sperm: NONE SEEN
Trich, Wet Prep: NONE SEEN
Yeast Wet Prep HPF POC: NONE SEEN

## 2019-06-04 LAB — POC URINE PREG, ED: Preg Test, Ur: NEGATIVE

## 2019-06-04 MED ORDER — METRONIDAZOLE 500 MG PO TABS
500.0000 mg | ORAL_TABLET | Freq: Once | ORAL | Status: AC
  Administered 2019-06-04: 500 mg via ORAL
  Filled 2019-06-04: qty 1

## 2019-06-04 MED ORDER — ACETAMINOPHEN 325 MG PO TABS
650.0000 mg | ORAL_TABLET | Freq: Once | ORAL | Status: AC
  Administered 2019-06-04: 650 mg via ORAL
  Filled 2019-06-04: qty 2

## 2019-06-04 MED ORDER — DOXYCYCLINE HYCLATE 100 MG PO TABS
100.0000 mg | ORAL_TABLET | Freq: Once | ORAL | Status: AC
  Administered 2019-06-04: 100 mg via ORAL
  Filled 2019-06-04: qty 1

## 2019-06-04 MED ORDER — METRONIDAZOLE 500 MG PO TABS: 500.0000 mg | ORAL_TABLET | Freq: Two times a day (BID) | ORAL | 0 refills | Status: AC

## 2019-06-04 MED ORDER — CEFTRIAXONE SODIUM 500 MG IJ SOLR
500.0000 mg | Freq: Once | INTRAMUSCULAR | Status: AC
  Administered 2019-06-04: 500 mg via INTRAMUSCULAR
  Filled 2019-06-04: qty 500

## 2019-06-04 MED ORDER — ACYCLOVIR 400 MG PO TABS: 400.0000 mg | ORAL_TABLET | Freq: Three times a day (TID) | ORAL | 0 refills | Status: DC

## 2019-06-04 MED ORDER — DOXYCYCLINE HYCLATE 100 MG PO CAPS: 100.0000 mg | ORAL_CAPSULE | Freq: Two times a day (BID) | ORAL | 0 refills | Status: AC

## 2019-06-04 NOTE — ED Notes (Signed)
No reaction to rocephin

## 2019-06-04 NOTE — ED Triage Notes (Signed)
Pt reports hx of BV that has been ongoing, she reports that she has had thick, yellow discharge with pain in her vaginal area for the past few days, states she was recently treated for BV in march. Also endorses dysuria without frequency.

## 2019-06-04 NOTE — Discharge Instructions (Addendum)
Your exam is concerning for pelvic inflammatory disease.  We have treated you with antibiotics.  Your wet prep did show bacterial vaginosis which we are also giving you medications for.  Do not drink alcohol while taking this.  You did have 2 sores which are concerning for herpes.  We are treating you for these.  I would suggest follow-up with OB/GYN for laboratory testing

## 2019-06-04 NOTE — ED Provider Notes (Signed)
[facility] EMERGENCY DEPARTMENT Provider Note   CSN: [id number] Arrival date & time: 06/04/19  1220    History Chief Complaint  Patient presents with  . Vaginal Discharge    Alyssa Hartman is a 24 y.o. female with no significant past medical records who presents for evaluation of vaginal discharge.  Patient also states she has a painful labial sore.  Was treated last month for BV.  Since then she has had a thick yellow vaginal discharge.  Denies any fevers or chills.  She is sexually active intermittently uses protection.  Admits to prior history of STDs however does not know what these were.  Denies chest pain, shortness of breath, abdominal pain, pain with bowel movements, rashes or lesions.  Does admit to dysuria without hematuria.  Denies additional aggravating or alleviating factors.  History obtained from patient and past medical records. No interpreter is used.   HPI     Past Medical History:  Diagnosis Date  . Carpal tunnel syndrome   . Hx of Hashimoto thyroiditis   . Hypothyroid     Patient Active Problem List   Diagnosis Date Noted  . Pregnancy 03/18/2018  . GBS (group B Streptococcus carrier), +RV culture, currently pregnant 03/16/2018  . Gestational diabetes 02/03/2018  . Unwanted fertility 12/08/2017  . History of genetic disease 10/13/2017  . History of prior pregnancy with IUGR newborn 10/13/2017  . Biological false positive RPR test 12/06/2016  . Supervision of high risk pregnancy, antepartum 07/28/2016  . Hashimoto's thyroiditis 10/08/2012  . Obesity 10/08/2012  . Bipolar 1 disorder (HCC) 06/23/2011    Past Surgical History:  Procedure Laterality Date  . NO PAST SURGERIES       OB History    Gravida  3   Para  3   Term  3   Preterm  0   AB      Living  3     SAB      TAB      Ectopic      Multiple  0   Live Births  3           Family History  Problem Relation Age of Onset  . Diabetes Maternal  Grandmother   . Hypertension Maternal Grandmother     Social History   Tobacco Use  . Smoking status: Former Smoker    Quit date: 03/18/2013    Years since quitting: 6.2  . Smokeless tobacco: Never Used  Substance Use Topics  . Alcohol use: No  . Drug use: No    Home Medications Prior to Admission medications   Medication Sig Start Date End Date Taking? Authorizing Provider  acyclovir (ZOVIRAX) 400 MG tablet Take 1 tablet (400 mg total) by mouth 3 (three) times daily for 5 days. 06/04/19 06/09/19  Tasharra Nodine A, PA-C  doxycycline (VIBRAMYCIN) 100 MG capsule Take 1 capsule (100 mg total) by mouth 2 (two) times daily for 14 days. 06/04/19 06/18/19  Lema Heinkel A, PA-C  metroNIDAZOLE (FLAGYL) 500 MG tablet Take 1 tablet (500 mg total) by mouth 2 (two) times daily for 14 days. 06/04/19 06/18/19  Vernel Donlan A, PA-C    Allergies    Patient has no known allergies.  Review of Systems   Review of Systems  Constitutional: Negative.   HENT: Negative.   Respiratory: Negative.   Cardiovascular: Negative.   Gastrointestinal: Negative.   Genitourinary: Positive for dysuria, vaginal discharge and vaginal pain. Negative for decreased urine volume, difficulty  urinating, flank pain, frequency, genital sores, menstrual problem, pelvic pain, urgency and vaginal bleeding.  Musculoskeletal: Negative.   Skin: Negative.   Neurological: Negative.   All other systems reviewed and are negative.   Physical Exam Updated Vital Signs BP 106/65 (BP Location: Left Arm)   Pulse (!) 101   Temp (!) 100.7 F (38.2 C) (Oral)   Resp 12   Ht 5\' 2"  (1.575 m)   Wt 104.3 kg   LMP 05/27/2019 (Exact Date)   SpO2 95%   BMI 42.07 kg/m   Physical Exam Vitals and nursing note reviewed. Exam conducted with a chaperone present.  Constitutional:      General: She is not in acute distress.    Appearance: She is well-developed. She is not ill-appearing or toxic-appearing.  HENT:     Head:  Normocephalic and atraumatic.     Nose: Nose normal.     Mouth/Throat:     Mouth: Mucous membranes are moist.     Pharynx: Oropharynx is clear.  Eyes:     Pupils: Pupils are equal, round, and reactive to light.  Cardiovascular:     Rate and Rhythm: Normal rate.     Pulses: Normal pulses.     Heart sounds: Normal heart sounds.  Pulmonary:     Effort: Pulmonary effort is normal. No respiratory distress.     Breath sounds: Normal breath sounds.  Abdominal:     General: Bowel sounds are normal. There is no distension.  Genitourinary:    Comments: Normal appearing external female genitalia without rashes. #3 vesicular appearing lesions to left labia, tender to palpation. Cervix with yellow, frothy discharge. No cervical petechiae. Cervical os is closed. There is no bleeding noted at the os. Moderate Odor. Bimanual: CMT tenderness with mild left adnexal tenderness.  No palpable adnexal masses. Uterus midline and not fixed. Rectovaginal exam was deferred.  No cystocele or rectocele noted. No pelvic lymphadenopathy noted. Wet prep was obtained.  Cultures for gonorrhea and chlamydia collected. Exam performed with chaperone in room. Musculoskeletal:        General: Normal range of motion.     Cervical back: Normal range of motion.  Skin:    General: Skin is warm and dry.  Neurological:     Mental Status: She is alert.     ED Results / Procedures / Treatments   Labs (all labs ordered are listed, but only abnormal results are displayed) Labs Reviewed  WET PREP, GENITAL - Abnormal; Notable for the following components:      Result Value   Clue Cells Wet Prep HPF POC PRESENT (*)    WBC, Wet Prep HPF POC MANY (*)    All other components within normal limits  URINALYSIS, ROUTINE W REFLEX MICROSCOPIC - Abnormal; Notable for the following components:   Color, Urine AMBER (*)    APPearance CLOUDY (*)    Specific Gravity, Urine 1.031 (*)    Hgb urine dipstick SMALL (*)    Ketones, ur 5 (*)     Protein, ur 100 (*)    Leukocytes,Ua LARGE (*)    WBC, UA >50 (*)    All other components within normal limits  URINE CULTURE  HIV ANTIBODY (ROUTINE TESTING W REFLEX)  RPR  POC URINE PREG, ED  GC/CHLAMYDIA PROBE AMP () NOT AT [facility]    EKG None  Radiology US PELVIC COMPLETE W TRANSVAGINAL AND TORSION R/O  Result Date: 06/04/2019 CLINICAL DATA:  Left adnexal tenderness, pelvic pain EXAM: TRANSABDOMINAL AND  TRANSVAGINAL ULTRASOUND OF PELVIS DOPPLER ULTRASOUND OF OVARIES TECHNIQUE: Both transabdominal and transvaginal ultrasound examinations of the pelvis were performed. Transabdominal technique was performed for global imaging of the pelvis including uterus, ovaries, adnexal regions, and pelvic cul-de-sac. It was necessary to proceed with endovaginal exam following the transabdominal exam to visualize the right ovary. Color and duplex Doppler ultrasound was utilized to evaluate blood flow to the ovaries. COMPARISON:  None. FINDINGS: Uterus Measurements: 10.1 x 5.2 x 6.5 cm = volume: 176 mL. No fibroids or other mass visualized. Endometrium Thickness: 10 mm.  No focal abnormality visualized. Right ovary Measurements: 3.2 x 1.6 x 2.7 cm = volume: 7 mL. Normal appearance/no adnexal mass. Left ovary Measurements: 3.6 x 3.1 x 2.2 cm = volume: 13 mL. Normal appearance/no adnexal mass. Pulsed Doppler evaluation of the right ovary demonstrates normal low-resistance arterial and venous waveforms. Evaluation of the left ovary is limited to transabdominal and therefore slightly constrained, but also favored to demonstrate both arterial and venous flow on the single provided waveform. Other findings No abnormal free fluid. IMPRESSION: Negative pelvic ultrasound. No findings suspicious for ovarian torsion. Electronically Signed   By: Charline Bills M.D.   On: 06/04/2019 15:47    Procedures Procedures (including critical care time)  Medications Ordered in ED Medications  cefTRIAXone (ROCEPHIN)  injection 500 mg (has no administration in time range)  doxycycline (VIBRA-TABS) tablet 100 mg (has no administration in time range)  metroNIDAZOLE (FLAGYL) tablet 500 mg (has no administration in time range)  acetaminophen (TYLENOL) tablet 650 mg (650 mg Oral Given 06/04/19 1414)    ED Course  I have reviewed the triage vital signs and the nursing notes.  Pertinent labs & imaging results that were available during my care of the patient were reviewed by me and considered in my medical decision making (see chart for details).  24 year old female presents for evaluation of vaginal discharge and labial lesion.  Does not appear septic or ill. Recently treated 1 month ago for bacterial vaginosis.  Has had thick yellow vaginal discharge since then.  Sexually active with history of STDs however does not recall what they were.  Admits to dysuria without hematuria or flank pain.  On arrival she has temperature to 100.7.  She is nonseptic, non-ill-appearing.  Heart and lungs clear.  Abdomen soft, nontender.  Plan on CT testing as well as urinalysis.  GU with yellow frothy discharge at cervical vault.  Patient also with #3 vesicular type lesions to left labia consistent with herpes labialis.  Positive CMT tenderness, with very minimal left adnexal tenderness.  Clinical suspicion for PID and herpetic lesion. Plan on treating for STD as well as ultrasound.  Imaging without any acute findings  Patient tolerating p.o. intake without difficulty.  Patient defervesced with Tylenol.  Heart rate at 80s.  Appears overall well.  Reevaluation benign abdominal exam without rebound or guarding.  Low suspicion for acute surgical abdomen.  Will be discharged home with antibiotics.  She will need follow-up in 2 days for reevaluation  The patient has been appropriately medically screened and/or stabilized in the ED. I have low suspicion for any other emergent medical condition which would require further screening, evaluation  or treatment in the ED or require inpatient management.  Patient is hemodynamically stable and in no acute distress.  Patient able to ambulate in department prior to ED.  Evaluation does not show acute pathology that would require ongoing or additional emergent interventions while in the emergency department or further inpatient  treatment.  I have discussed the diagnosis with the patient and answered all questions.  Pain is been managed while in the emergency department and patient has no further complaints prior to discharge.  Patient is comfortable with plan discussed in room and is stable for discharge at this time.  I have discussed strict return precautions for returning to the emergency department.  Patient was encouraged to follow-up with PCP/specialist refer to at discharge.      MDM Rules/Calculators/A&P                       Final Clinical Impression(s) / ED Diagnoses Final diagnoses:  Adnexal tenderness, left  PID (acute pelvic inflammatory disease)  BV (bacterial vaginosis)  Vesicular lesion    Rx / DC Orders ED Discharge Orders         Ordered    doxycycline (VIBRAMYCIN) 100 MG capsule  2 times daily     06/04/19 1508    metroNIDAZOLE (FLAGYL) 500 MG tablet  2 times daily     06/04/19 1508    acyclovir (ZOVIRAX) 400 MG tablet  3 times daily     06/04/19 1508           Salvatore Shear A, PA-C 06/04/19 1557    Jacalyn Lefevre, MD 06/04/19 1611

## 2019-06-04 NOTE — ED Notes (Signed)
Pt transported to US

## 2019-06-05 LAB — URINE CULTURE: Culture: <10000 — AB

## 2019-06-05 LAB — RPR
RPR Ser Ql: REACTIVE — AB
RPR Titer: 1:2 {titer}

## 2019-06-06 LAB — GC/CHLAMYDIA PROBE AMP (~~LOC~~) NOT AT ARMC
Chlamydia: NEGATIVE
Comment: NEGATIVE
Comment: NORMAL
Neisseria Gonorrhea: NEGATIVE

## 2019-06-06 LAB — T.PALLIDUM AB, TOTAL: T Pallidum Abs: NONREACTIVE

## 2019-06-10 ENCOUNTER — Telehealth (INDEPENDENT_AMBULATORY_CARE_PROVIDER_SITE_OTHER): Payer: Self-pay

## 2019-06-10 NOTE — Telephone Encounter (Signed)
Sent to PCP ?

## 2019-06-10 NOTE — Telephone Encounter (Signed)
Patient called to make a medication refill for   acyclovir (ZOVIRAX) 400 MG tablet  Patient uses   [facility] - [place] ([place]), [place] - [address]   Please advice   [phone]

## 2019-06-13 ENCOUNTER — Other Ambulatory Visit (INDEPENDENT_AMBULATORY_CARE_PROVIDER_SITE_OTHER): Payer: Self-pay | Admitting: Primary Care

## 2019-06-13 MED ORDER — ACYCLOVIR 400 MG PO TABS: 400.0000 mg | ORAL_TABLET | Freq: Three times a day (TID) | ORAL | 0 refills | Status: AC

## 2019-06-15 ENCOUNTER — Inpatient Hospital Stay (INDEPENDENT_AMBULATORY_CARE_PROVIDER_SITE_OTHER): Payer: Medicaid Other | Admitting: Primary Care

## 2019-06-22 ENCOUNTER — Other Ambulatory Visit: Payer: Self-pay

## 2019-06-22 ENCOUNTER — Ambulatory Visit (INDEPENDENT_AMBULATORY_CARE_PROVIDER_SITE_OTHER): Payer: Medicaid Other | Admitting: Primary Care

## 2019-06-22 ENCOUNTER — Encounter (INDEPENDENT_AMBULATORY_CARE_PROVIDER_SITE_OTHER): Payer: Self-pay | Admitting: Primary Care

## 2019-06-22 VITALS — BP 101/62 | HR 96 | Temp 97.3°F | Ht 62.0 in | Wt 241.0 lb

## 2019-06-22 DIAGNOSIS — N898 Other specified noninflammatory disorders of vagina: Secondary | ICD-10-CM | POA: Diagnosis not present

## 2019-06-22 DIAGNOSIS — B009 Herpesviral infection, unspecified: Secondary | ICD-10-CM

## 2019-06-22 MED ORDER — ACYCLOVIR 400 MG PO TABS: 400.0000 mg | ORAL_TABLET | Freq: Two times a day (BID) | ORAL | 1 refills | Status: DC

## 2019-06-22 NOTE — Progress Notes (Signed)
Acute Office Visit  Subjective:    Patient ID: Alyssa Hartman, female    DOB: September 03, 1995, 24 y.o.   MRN: [id number]  Chief Complaint  Patient presents with  . Pelvic Inflammatory Disease    with bacteria vaginits   . Medication Refill    acyclovir     HPI [name] is a 24 year old African American female that is sexually active with a history sexually transmitted disease. Recently seen in emergency room and treated for pelvic inflammatory disease. Requesting refills on HSV medications. Reviewed records no cultures but presentation was  genital sores. #3 vesicular appearing lesions to left labia, tender to palpation.  Past Medical History:  Diagnosis Date  . Carpal tunnel syndrome   . Hx of Hashimoto thyroiditis   . Hypothyroid     Past Surgical History:  Procedure Laterality Date  . NO PAST SURGERIES      Family History  Problem Relation Age of Onset  . Diabetes Maternal Grandmother   . Hypertension Maternal Grandmother     Social History   Socioeconomic History  . Marital status: Single    Spouse name: Not on file  . Number of children: Not on file  . Years of education: Not on file  . Highest education level: Not on file  Occupational History  . Not on file  Tobacco Use  . Smoking status: Former Smoker    Quit date: 03/18/2013    Years since quitting: 6.2  . Smokeless tobacco: Never Used  Substance and Sexual Activity  . Alcohol use: No  . Drug use: No  . Sexual activity: Not Currently    Birth control/protection: None  Other Topics Concern  . Not on file  Social History Narrative  . Not on file   Social Determinants of Health   Financial Resource Strain:   . Difficulty of Paying Living Expenses:   Food Insecurity:   . Worried About Programme researcher, broadcasting/film/video in the Last Year:   . Barista in the Last Year:   Transportation Needs:   . Freight forwarder (Medical):   Marland Kitchen Lack of Transportation (Non-Medical):   Physical Activity:    . Days of Exercise per Week:   . Minutes of Exercise per Session:   Stress:   . Feeling of Stress :   Social Connections:   . Frequency of Communication with Friends and Family:   . Frequency of Social Gatherings with Friends and Family:   . Attends Religious Services:   . Active Member of Clubs or Organizations:   . Attends Banker Meetings:   Marland Kitchen Marital Status:   Intimate Partner Violence:   . Fear of Current or Ex-Partner:   . Emotionally Abused:   Marland Kitchen Physically Abused:   . Sexually Abused:     No outpatient medications prior to visit.   No facility-administered medications prior to visit.    No Known Allergies  Review of Systems  Genitourinary: Positive for vaginal discharge.  All other systems reviewed and are negative.      Objective:    Physical Exam Vitals reviewed.  Constitutional:      Appearance: She is obese.  HENT:     Head: Normocephalic.  Cardiovascular:     Rate and Rhythm: Normal rate and regular rhythm.  Genitourinary:    Comments: Small papules inner labia dried unable to culture, normal vagina discharge  Musculoskeletal:        General: Normal range  of motion.     Cervical back: Normal range of motion.  Skin:    General: Skin is warm and dry.  Neurological:     Comments: Question actual ability of understanding with comprehension   Psychiatric:        Mood and Affect: Mood normal.        Behavior: Behavior normal.     BP 101/62 (BP Location: Left Arm, Patient Position: Sitting, Cuff Size: Large)   Pulse 96   Temp (!) 97.3 F (36.3 C) (Temporal)   Ht 5\' 2"  (1.575 m)   Wt 241 lb (109.3 kg)   LMP 05/27/2019 (Exact Date)   SpO2 100%   Breastfeeding No   BMI 44.08 kg/m  Wt Readings from Last 3 Encounters:  06/22/19 241 lb (109.3 kg)  06/04/19 230 lb (104.3 kg)  04/22/19 245 lb 6.4 oz (111.3 kg)    Health Maintenance Due  Topic Date Due  . COVID-19 Vaccine (1) Never done    There are no preventive care reminders  to display for this patient.   Lab Results  Component Value Date   TSH 4.390 01/04/2019   Lab Results  Component Value Date   WBC 8.2 01/04/2019   HGB 11.7 01/04/2019   HCT 37.4 01/04/2019   MCV 79 01/04/2019   PLT 281 01/04/2019   Lab Results  Component Value Date   NA 142 01/04/2019   K 3.9 01/04/2019   CO2 25 01/04/2019   GLUCOSE 111 (H) 01/04/2019   BUN 10 01/04/2019   CREATININE 0.70 01/04/2019   BILITOT <0.2 01/04/2019   ALKPHOS 82 01/04/2019   AST 17 01/04/2019   ALT 10 01/04/2019   PROT 7.2 01/04/2019   ALBUMIN 4.2 01/04/2019   CALCIUM 9.1 01/04/2019   ANIONGAP 9 05/25/2018   No results found for: CHOL No results found for: HDL No results found for: LDLCALC No results found for: TRIG No results found for: CHOLHDL Lab Results  Component Value Date   HGBA1C 5.5 04/05/2019       Assessment & Plan:  Alyssa Hartman was seen today for pelvic inflammatory disease and medication refill.  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 Treating prophylactic with  acyclovir (ZOVIRAX) 400 MG tablet; Take 1 tablet (400 mg total) by mouth 2 (two) times daily. If a break out instructed to call office and with treat for acute recurrent HSV2.  Other orders -     acyclovir (ZOVIRAX) 400 MG tablet; Take 1 tablet (400 mg total) by mouth 2 (two) times daily.     Meds ordered this encounter  Medications  . acyclovir (ZOVIRAX) 400 MG tablet    Sig: Take 1 tablet (400 mg total) by mouth 2 (two) times daily.    Dispense:  180 tablet    Refill:  1     Kerin Perna, NP

## 2019-08-01 ENCOUNTER — Other Ambulatory Visit: Payer: Self-pay

## 2019-08-01 ENCOUNTER — Telehealth (INDEPENDENT_AMBULATORY_CARE_PROVIDER_SITE_OTHER): Payer: Medicaid Other | Admitting: Primary Care

## 2019-08-01 ENCOUNTER — Encounter (INDEPENDENT_AMBULATORY_CARE_PROVIDER_SITE_OTHER): Payer: Self-pay | Admitting: Primary Care

## 2019-08-01 DIAGNOSIS — N898 Other specified noninflammatory disorders of vagina: Secondary | ICD-10-CM

## 2019-08-01 DIAGNOSIS — A64 Unspecified sexually transmitted disease: Secondary | ICD-10-CM

## 2019-08-01 NOTE — Progress Notes (Addendum)
Virtual Visit via Telephone Note  I connected with Alyssa Hartman on 08/01/19 at  1:30 PM EDT by telephone and verified that I am speaking with the correct person using two identifiers.   I discussed the limitations, risks, security and privacy concerns of performing an evaluation and management service by telephone and the availability of in person appointments. I also discussed with the patient that there may be a patient responsible charge related to this service. The patient expressed understanding and agreed to proceed. Patient location Home [facility] Passe, NP location [facility]  History of Present Illness: Alyssa Hartman 24 year old female having a tele visit for vaginal discharge odor , itching and burning-asked did she sleep with the same person that she got a STD before yes. Explained it is no need to keep treating you for STD when your partners not being treated. She states he says he has not been sleeping with anyone else so he has nothing but never tested or treated. Stated you need to make some life choices and think how do you keep getting the same STD if he's the only one for years she's been with. Agreed she needs to be tested swab and partner because this doe not make any sense.. Patient agreed.   Past Medical History:  Diagnosis Date  . Carpal tunnel syndrome   . Hx of Hashimoto thyroiditis   . Hypothyroid   . Observations/Objective: Review of Systems  Genitourinary:       Vaginal discharge, odor ,   All other systems reviewed and are negative.   Assessment and Plan: Alyssa Hartman was seen today for pelvic inflammatory disease.  Diagnoses and all orders for this visit:  Vaginal discharge See HPI -     Cervicovaginal ancillary only; Future  Sexually transmitted disease (STD) Unable to understand why patient continues to have unprotected sex with her partner and gets STD. Last visit she was in the office she broke down crying stating this is it  I am done. Calls today with same symptoms partner never got tested or treated Pt counseled regarding condom use with each sexual activity to promote wellness and prevention of transmission of HIV, syphilis, herpes simplex virus, gonorrhea, chlamydia and trichomoniasis..    Follow Up Instructions:    I discussed the assessment and treatment plan with the patient. The patient was provided an opportunity to ask questions and all were answered. The patient agreed with the plan and demonstrated an understanding of the instructions.   The patient was advised to call back or seek an in-person evaluation if the symptoms worsen or if the condition fails to improve as anticipated.  I provided 10 minutes of non-face-to-face time during this encounter.   Grayce Sessions, NP

## 2019-08-01 NOTE — Progress Notes (Signed)
Pt is still experiencing intermittent pelvic pain and burning She would like to be rechecked for PID

## 2019-09-09 IMAGING — CR CHEST - 2 VIEW
2 series · 2 of 2 positions shown · non-contrast
Comparison: None.

CLINICAL DATA: Chest pain, shortness of breath and cough for 1
week. Orthopnea.

EXAM:
CHEST - 2 VIEW

[chest pa]
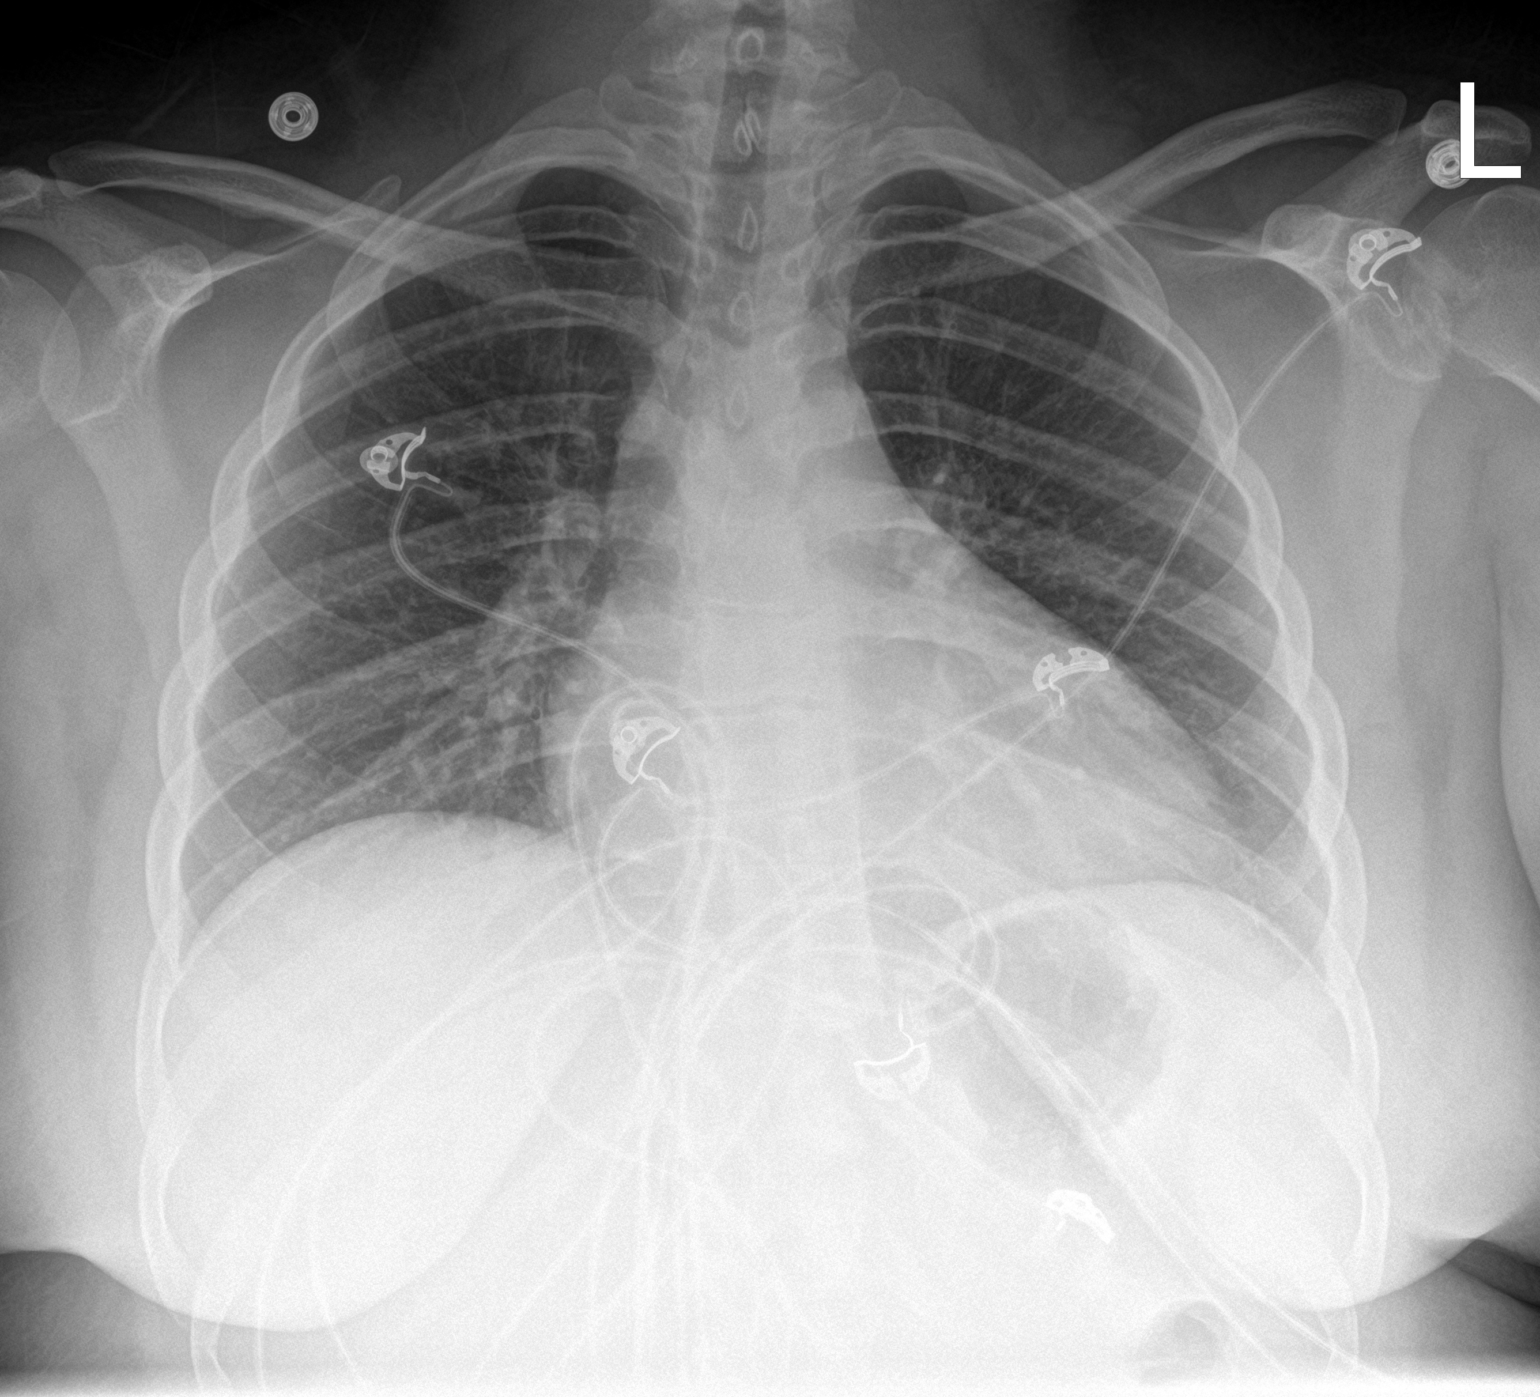

[chest lat]
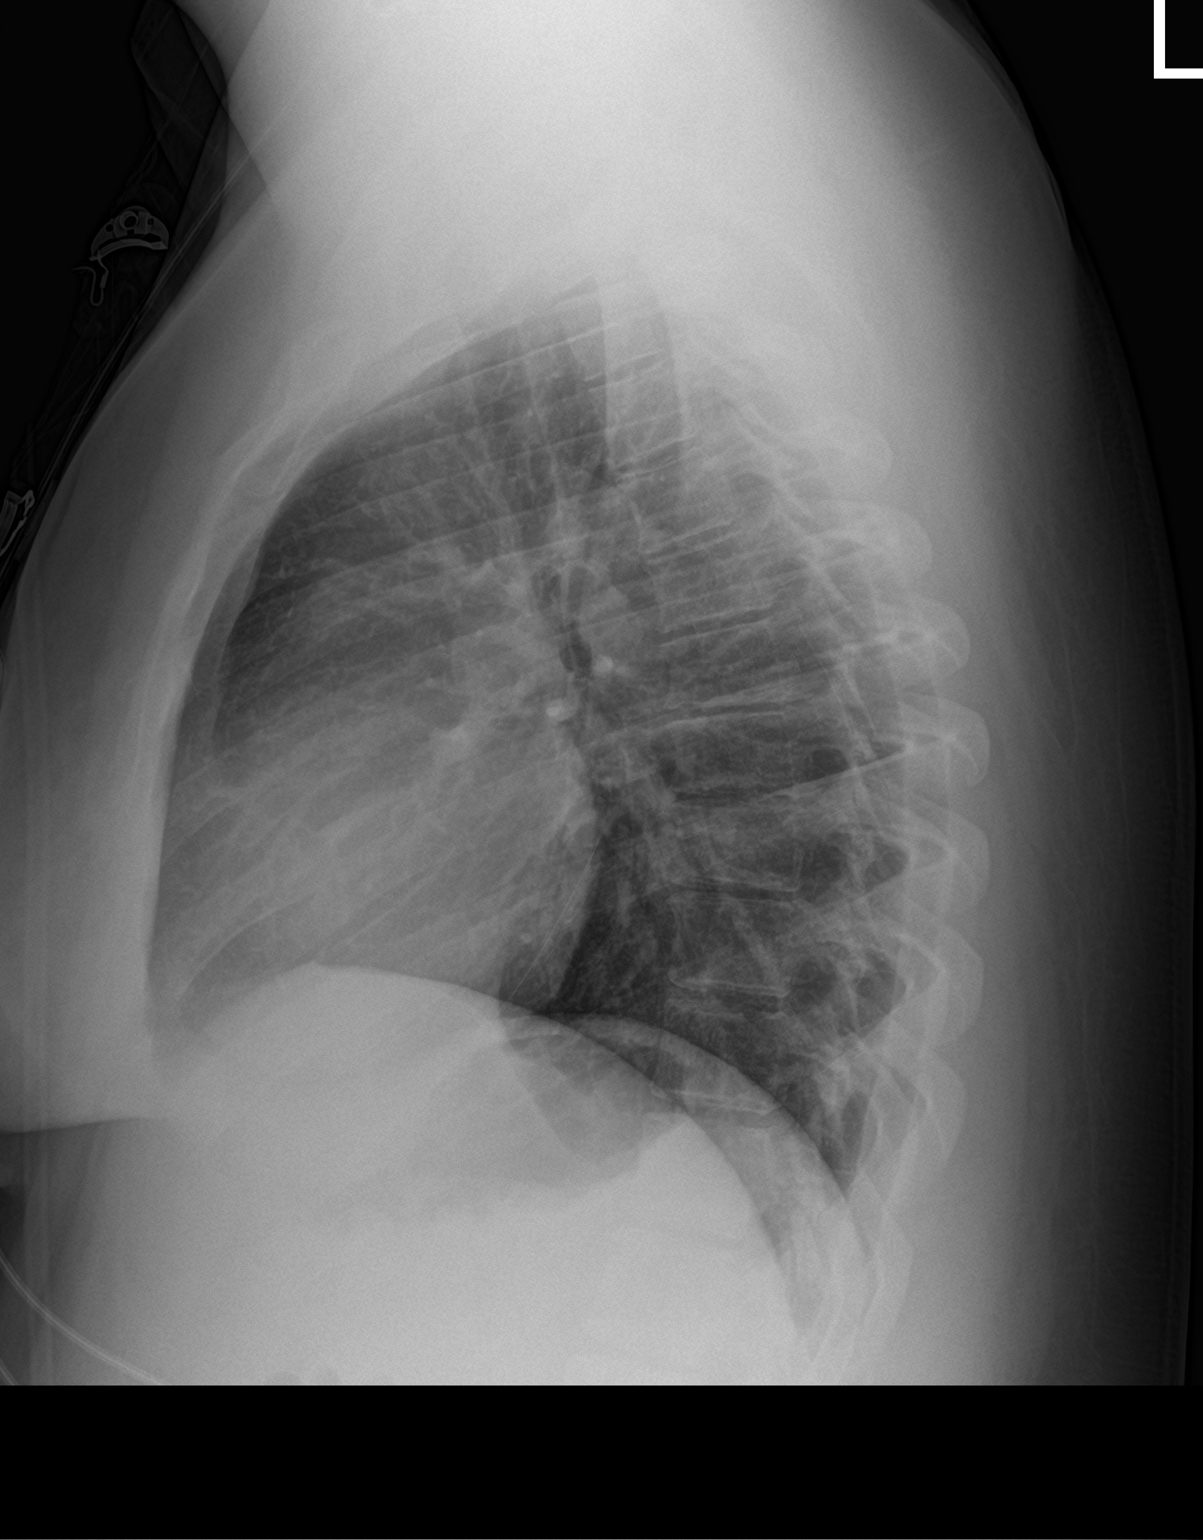

[2 of 2 positions shown; findings below may reference images not displayed]

FINDINGS: Cardiac silhouette is mildly enlarged. Mediastinal silhouette is
unremarkable. No pleural effusion or focal consolidation. No
pneumothorax. Soft tissue planes and included osseous structures are
nonacute.
IMPRESSION: 1. Mild cardiomegaly.  No acute pulmonary process.

## 2019-12-20 ENCOUNTER — Ambulatory Visit (INDEPENDENT_AMBULATORY_CARE_PROVIDER_SITE_OTHER): Payer: Medicaid Other | Admitting: Primary Care

## 2019-12-20 ENCOUNTER — Other Ambulatory Visit: Payer: Self-pay

## 2019-12-27 ENCOUNTER — Encounter (INDEPENDENT_AMBULATORY_CARE_PROVIDER_SITE_OTHER): Payer: Self-pay | Admitting: Primary Care

## 2019-12-27 ENCOUNTER — Other Ambulatory Visit (HOSPITAL_COMMUNITY)
Admission: RE | Admit: 2019-12-27 | Discharge: 2019-12-27 | Disposition: A | Discharge status: Home / Self Care | Payer: Medicaid Other | Source: Ambulatory Visit | Attending: Primary Care | Admitting: Primary Care

## 2019-12-27 ENCOUNTER — Ambulatory Visit (INDEPENDENT_AMBULATORY_CARE_PROVIDER_SITE_OTHER): Payer: Medicaid Other | Admitting: Primary Care

## 2019-12-27 ENCOUNTER — Other Ambulatory Visit: Payer: Self-pay

## 2019-12-27 VITALS — BP 119/83 | HR 101 | Temp 97.5°F | Ht 62.0 in | Wt 245.8 lb

## 2019-12-27 DIAGNOSIS — N898 Other specified noninflammatory disorders of vagina: Secondary | ICD-10-CM | POA: Diagnosis present

## 2019-12-27 NOTE — Patient Instructions (Signed)
Vaginitis Vaginitis is a condition in which the vaginal tissue swells and becomes red (inflamed). This condition is most often caused by a change in the normal balance of bacteria and yeast that live in the vagina. This change causes an overgrowth of certain bacteria or yeast, which causes the inflammation. There are different types of vaginitis, but the most common types are:  Bacterial vaginosis.  Yeast infection (candidiasis).  Trichomoniasis vaginitis. This is a sexually transmitted disease (STD).  Viral vaginitis.  Atrophic vaginitis.  Allergic vaginitis. What are the causes? The cause of this condition depends on the type of vaginitis. It can be caused by:  Bacteria (bacterial vaginosis).  Yeast, which is a fungus (yeast infection).  A parasite (trichomoniasis vaginitis).  A virus (viral vaginitis).  Low hormone levels (atrophic vaginitis). Low hormone levels can occur during pregnancy, breastfeeding, or after menopause.  Irritants, such as bubble baths, scented tampons, and feminine sprays (allergic vaginitis). Other factors can change the normal balance of the yeast and bacteria that live in the vagina. These include:  Antibiotic medicines.  Poor hygiene.  Diaphragms, vaginal sponges, spermicides, birth control pills, and intrauterine devices (IUD).  Sex.  Infection.  Uncontrolled diabetes.  A weakened defense (immune) system. What increases the risk? This condition is more likely to develop in women who:  Smoke.  Use vaginal douches, scented tampons, or scented sanitary pads.  Wear tight-fitting pants.  Wear thong underwear.  Use oral birth control pills or an IUD.  Have sex without a condom.  Have multiple sex partners.  Have an STD.  Frequently use the spermicide nonoxynol-9.  Eat lots of foods high in sugar.  Have uncontrolled diabetes.  Have low estrogen levels.  Have a weakened immune system from an immune disorder or medical  treatment.  Are pregnant or breastfeeding. What are the signs or symptoms? Symptoms vary depending on the cause of the vaginitis. Common symptoms include:  Abnormal vaginal discharge. ? The discharge is white, gray, or yellow with bacterial vaginosis. ? The discharge is thick, white, and cheesy with a yeast infection. ? The discharge is frothy and yellow or greenish with trichomoniasis.  A bad vaginal smell. The smell is fishy with bacterial vaginosis.  Vaginal itching, pain, or swelling.  Sex that is painful.  Pain or burning when urinating. Sometimes there are no symptoms. How is this diagnosed? This condition is diagnosed based on your symptoms and medical history. A physical exam, including a pelvic exam, will also be done. You may also have other tests, including:  Tests to determine the pH level (acidity or alkalinity) of your vagina.  A whiff test, to assess the odor that results when a sample of your vaginal discharge is mixed with a potassium hydroxide solution.  Tests of vaginal fluid. A sample will be examined under a microscope. How is this treated? Treatment varies depending on the type of vaginitis you have. Your treatment may include:  Antibiotic creams or pills to treat bacterial vaginosis and trichomoniasis.  Antifungal medicines, such as vaginal creams or suppositories, to treat a yeast infection.  Medicine to ease discomfort if you have viral vaginitis. Your sexual partner should also be treated.  Estrogen delivered in a cream, pill, suppository, or vaginal ring to treat atrophic vaginitis. If vaginal dryness occurs, lubricants and moisturizing creams may help. You may need to avoid scented soaps, sprays, or douches.  Stopping use of a product that is causing allergic vaginitis. Then using a vaginal cream to treat the symptoms. Follow   these instructions at home: Lifestyle  Keep your genital area clean and dry. Avoid soap, and only rinse the area with  water.  Do not douche or use tampons until your health care provider says it is okay to do so. Use sanitary pads, if needed.  Do not have sex until your health care provider approves. When you can return to sex, practice safe sex and use condoms.  Wipe from front to back. This avoids the spread of bacteria from the rectum to the vagina. General instructions  Take over-the-counter and prescription medicines only as told by your health care provider.  If you were prescribed an antibiotic medicine, take or use it as told by your health care provider. Do not stop taking or using the antibiotic even if you start to feel better.  Keep all follow-up visits as told by your health care provider. This is important. How is this prevented?  Use mild, non-scented products. Do not use things that can irritate the vagina, such as fabric softeners. Avoid the following products if they are scented: ? Feminine sprays. ? Detergents. ? Tampons. ? Feminine hygiene products. ? Soaps or bubble baths.  Let air reach your genital area. ? Wear cotton underwear to reduce moisture buildup. ? Avoid wearing underwear while you sleep. ? Avoid wearing tight pants and underwear or nylons without a cotton panel. ? Avoid wearing thong underwear.  Take off any wet clothing, such as bathing suits, as soon as possible.  Practice safe sex and use condoms. Contact a health care provider if:  You have abdominal pain.  You have a fever.  You have symptoms that last for more than 2-3 days. Get help right away if:  You have a fever and your symptoms suddenly get worse. Summary  Vaginitis is a condition in which the vaginal tissue becomes inflamed.This condition is most often caused by a change in the normal balance of bacteria and yeast that live in the vagina.  Treatment varies depending on the type of vaginitis you have.  Do not douche, use tampons , or have sex until your health care provider approves. When  you can return to sex, practice safe sex and use condoms. This information is not intended to replace advice given to you by your health care provider. Make sure you discuss any questions you have with your health care provider. Document Revised: 01/23/2017 Document Reviewed: 03/18/2016 Elsevier Patient Education  2020 Elsevier Inc.  

## 2019-12-27 NOTE — Progress Notes (Signed)
Acute Office Visit  Subjective:    Patient ID: Alyssa Hartman, female    DOB: 11/29/1995, 24 y.o.   MRN: [id number]  Chief Complaint  Patient presents with  . Pelvic Inflammatory Disease  . Vaginal Discharge    HPI Ms. CONDA WANNAMAKER is a 24 y.o. [id number] who LMP was Patient's last menstrual period was 12/01/2019 (exact date)., presents today for a problem visit.   Patient complains of an abnormal vaginal discharge for 3 months. Discharge described as: white, yellow, green and malodorous. Vaginal symptoms include odor, pain and vulvar itching.Vulvar symptoms include odor and pain.STI Risk: Possible STD exposure.   Other associated symptoms: burning, odor, pain and vulvar itching.Menstrual pattern: She had been bleeding regularly. Contraception: none.   She reports changing detergent  powders extra to gain no other changes in soaps, no other coinciding with the onset of her symptoms.  She has not previously self treated or been under treatment by another provider for these symptoms.    Past Medical History:  Diagnosis Date  . Carpal tunnel syndrome   . Hx of Hashimoto thyroiditis   . Hypothyroid     Past Surgical History:  Procedure Laterality Date  . NO PAST SURGERIES      Family History  Problem Relation Age of Onset  . Diabetes Maternal Grandmother   . Hypertension Maternal Grandmother     Social History   Socioeconomic History  . Marital status: Single    Spouse name: Not on file  . Number of children: Not on file  . Years of education: Not on file  . Highest education level: Not on file  Occupational History  . Not on file  Tobacco Use  . Smoking status: Former Smoker    Quit date: 03/18/2013    Years since quitting: 6.7  . Smokeless tobacco: Never Used  Vaping Use  . Vaping Use: Never used  Substance and Sexual Activity  . Alcohol use: No  . Drug use: No  . Sexual activity: Not Currently    Birth control/protection: None  Other Topics Concern  . Not  on file  Social History Narrative  . Not on file   Social Determinants of Health   Financial Resource Strain:   . Difficulty of Paying Living Expenses: Not on file  Food Insecurity:   . Worried About Programme researcher, broadcasting/film/video in the Last Year: Not on file  . Ran Out of Food in the Last Year: Not on file  Transportation Needs:   . Lack of Transportation (Medical): Not on file  . Lack of Transportation (Non-Medical): Not on file  Physical Activity:   . Days of Exercise per Week: Not on file  . Minutes of Exercise per Session: Not on file  Stress:   . Feeling of Stress : Not on file  Social Connections:   . Frequency of Communication with Friends and Family: Not on file  . Frequency of Social Gatherings with Friends and Family: Not on file  . Attends Religious Services: Not on file  . Active Member of Clubs or Organizations: Not on file  . Attends Banker Meetings: Not on file  . Marital Status: Not on file  Intimate Partner Violence:   . Fear of Current or Ex-Partner: Not on file  . Emotionally Abused: Not on file  . Physically Abused: Not on file  . Sexually Abused: Not on file    Outpatient Medications Prior to Visit  Medication Sig Dispense Refill  .  acyclovir (ZOVIRAX) 400 MG tablet Take 1 tablet (400 mg total) by mouth 2 (two) times daily. 180 tablet 1   No facility-administered medications prior to visit.    No Known Allergies  Review of Systems  Genitourinary: Positive for vaginal discharge and vaginal pain.  All other systems reviewed and are negative.      Objective:    Physical Exam Vitals reviewed.  Constitutional:      Appearance: She is obese.  HENT:     Nose: Nose normal.  Eyes:     Extraocular Movements: Extraocular movements intact.  Cardiovascular:     Rate and Rhythm: Normal rate and regular rhythm.  Pulmonary:     Effort: Pulmonary effort is normal.     Breath sounds: Normal breath sounds.  Abdominal:     General: Bowel sounds  are normal. There is distension.     Palpations: Abdomen is soft.  Musculoskeletal:        General: Normal range of motion.     Cervical back: Normal range of motion and neck supple.  Skin:    General: Skin is warm and dry.  Neurological:     Mental Status: She is alert and oriented to person, place, and time.  Psychiatric:        Mood and Affect: Mood normal.        Behavior: Behavior normal.        Thought Content: Thought content normal.        Judgment: Judgment normal.     BP 119/83 (BP Location: Right Arm, Patient Position: Sitting, Cuff Size: Large)   Pulse (!) 101   Temp (!) 97.5 F (36.4 C) (Temporal)   Ht 5\' 2"  (1.575 m)   Wt 245 lb 12.8 oz (111.5 kg)   LMP 12/01/2019 (Exact Date)   SpO2 96%   Breastfeeding No   BMI 44.96 kg/m  Wt Readings from Last 3 Encounters:  12/27/19 245 lb 12.8 oz (111.5 kg)  06/22/19 241 lb (109.3 kg)  06/04/19 230 lb (104.3 kg)    Health Maintenance Due  Topic Date Due  . Hepatitis C Screening  Never done  . COVID-19 Vaccine (1) Never done    There are no preventive care reminders to display for this patient.   Lab Results  Component Value Date   TSH 4.390 01/04/2019   Lab Results  Component Value Date   WBC 8.2 01/04/2019   HGB 11.7 01/04/2019   HCT 37.4 01/04/2019   MCV 79 01/04/2019   PLT 281 01/04/2019   Lab Results  Component Value Date   NA 142 01/04/2019   K 3.9 01/04/2019   CO2 25 01/04/2019   GLUCOSE 111 (H) 01/04/2019   BUN 10 01/04/2019   CREATININE 0.70 01/04/2019   BILITOT <0.2 01/04/2019   ALKPHOS 82 01/04/2019   AST 17 01/04/2019   ALT 10 01/04/2019   PROT 7.2 01/04/2019   ALBUMIN 4.2 01/04/2019   CALCIUM 9.1 01/04/2019   ANIONGAP 9 05/25/2018   No results found for: CHOL No results found for: HDL No results found for: LDLCALC No results found for: TRIG No results found for: CHOLHDL Lab Results  Component Value Date   HGBA1C 5.5 04/05/2019       Assessment & Plan:  [place] was seen  today for pelvic inflammatory disease and vaginal discharge.  Diagnoses and all orders for this visit:  Vaginal discharge -     Cervicovaginal ancillary only     No orders of  the defined types were placed in this encounter.    Grayce Sessions, NP

## 2019-12-28 LAB — CERVICOVAGINAL ANCILLARY ONLY
Bacterial Vaginitis (gardnerella): POSITIVE — AB
Candida Glabrata: NEGATIVE
Candida Vaginitis: NEGATIVE
Chlamydia: NEGATIVE
Comment: NEGATIVE
Comment: NEGATIVE
Comment: NEGATIVE
Comment: NEGATIVE
Comment: NEGATIVE
Comment: NORMAL
Neisseria Gonorrhea: NEGATIVE
Trichomonas: NEGATIVE

## 2019-12-29 ENCOUNTER — Telehealth (INDEPENDENT_AMBULATORY_CARE_PROVIDER_SITE_OTHER): Payer: Self-pay

## 2019-12-29 ENCOUNTER — Other Ambulatory Visit (INDEPENDENT_AMBULATORY_CARE_PROVIDER_SITE_OTHER): Payer: Self-pay | Admitting: Primary Care

## 2019-12-29 MED ORDER — FLUCONAZOLE 150 MG PO TABS: 150.0000 mg | ORAL_TABLET | Freq: Once | ORAL | 0 refills | Status: AC

## 2019-12-29 MED ORDER — METRONIDAZOLE 500 MG PO TABS: 500.0000 mg | ORAL_TABLET | Freq: Two times a day (BID) | ORAL | 0 refills | Status: DC

## 2019-12-29 NOTE — Telephone Encounter (Signed)
Left voicemail notifying patient that she does not havr an STD but is positive for BV. Metronidazole has been sent to the pharmacy. Take twice daily for 7 days and do not drink any alcohol while taking. Return call to [facility] at [phone] withy any questions or concerns. Maryjean Morn, CMA

## 2019-12-29 NOTE — Telephone Encounter (Signed)
-----   Message from Grayce Sessions, NP sent at 12/29/2019  3:23 PM EDT ----- No STD. Positive for BV. Metronidazole has been sent to the pharmacy. Take twice daily for 7 days and do not drink any alcohol while taking. Grayce Sessions, NP

## 2020-01-09 ENCOUNTER — Telehealth (INDEPENDENT_AMBULATORY_CARE_PROVIDER_SITE_OTHER): Payer: Self-pay | Admitting: Primary Care

## 2020-01-09 NOTE — Telephone Encounter (Signed)
Patient called to ask the doctor or nurse to send in a script for her medication to pharmacy.  She did not know the name of the medication but said it was for vaginitis.  Please advise and call patient to discuss at [phone]

## 2020-09-24 ENCOUNTER — Other Ambulatory Visit (INDEPENDENT_AMBULATORY_CARE_PROVIDER_SITE_OTHER): Payer: Self-pay | Admitting: Primary Care

## 2020-09-24 DIAGNOSIS — B009 Herpesviral infection, unspecified: Secondary | ICD-10-CM

## 2020-09-24 MED ORDER — ACYCLOVIR 400 MG PO TABS: 400.0000 mg | ORAL_TABLET | Freq: Two times a day (BID) | ORAL | 1 refills | Status: DC

## 2020-09-24 NOTE — Telephone Encounter (Signed)
Sent to pcp

## 2020-09-24 NOTE — Telephone Encounter (Signed)
Requested medications are due for refill today.  yes  Requested medications are on the active medications list.  yes  Last refill. 06/22/2019  Future visit scheduled.   yes  Notes to clinic.  Prescription is expired.

## 2020-10-01 ENCOUNTER — Telehealth (INDEPENDENT_AMBULATORY_CARE_PROVIDER_SITE_OTHER): Payer: Self-pay

## 2020-10-01 NOTE — Telephone Encounter (Signed)
Copied from CRM [id number]. Topic: General - Other >> Sep 28, 2020  4:33 PM Marylen Ponto wrote: Reason for CRM: Pt requests to speak with Gwinda Passe because [facility] is no longer giving out medications and she needs to know what she should do regarding getting a release. Pt stated she needs her mental health medication so she requests that Marcelino Duster return her call.Cb# [phone]  Please advice

## 2020-10-04 ENCOUNTER — Ambulatory Visit (INDEPENDENT_AMBULATORY_CARE_PROVIDER_SITE_OTHER): Payer: Medicaid Other | Admitting: Family

## 2020-10-04 ENCOUNTER — Other Ambulatory Visit (INDEPENDENT_AMBULATORY_CARE_PROVIDER_SITE_OTHER): Payer: Self-pay | Admitting: Primary Care

## 2020-10-04 ENCOUNTER — Encounter (INDEPENDENT_AMBULATORY_CARE_PROVIDER_SITE_OTHER): Payer: Self-pay | Admitting: Family

## 2020-10-04 ENCOUNTER — Other Ambulatory Visit: Payer: Self-pay

## 2020-10-04 ENCOUNTER — Other Ambulatory Visit (HOSPITAL_COMMUNITY)
Admission: RE | Admit: 2020-10-04 | Discharge: 2020-10-04 | Disposition: A | Discharge status: Home / Self Care | Payer: Medicaid Other | Source: Ambulatory Visit | Attending: Primary Care | Admitting: Primary Care

## 2020-10-04 VITALS — BP 130/83 | HR 71 | Temp 96.3°F | Ht 62.0 in | Wt 242.6 lb

## 2020-10-04 DIAGNOSIS — Z113 Encounter for screening for infections with a predominantly sexual mode of transmission: Secondary | ICD-10-CM | POA: Diagnosis not present

## 2020-10-04 DIAGNOSIS — F4323 Adjustment disorder with mixed anxiety and depressed mood: Secondary | ICD-10-CM

## 2020-10-04 NOTE — Patient Instructions (Signed)
Always use protection (Condom) during sexual intercourse Abstence is the best way to prevent STDs 3. Call or come back with new or worsening symptoms 4. If suicidal thoughts or plans, please call 911 or go to the nearest ER.

## 2020-10-04 NOTE — Progress Notes (Signed)
Alyssa Hartman, is a 25 y.o. female  IPJ:[id number]  BHA:[id number]  DOB - 1996/01/24  Subjective:  Chief Complaint and HPI: Alyssa Hartman is a 25 y.o. female who presents to the clinic this afternoon for STD screening.  Last sexual intercourse about 3 days ago, was unprotected.  Denies any consistent, dysuria, discharge, itching, odor, or any other symptoms related to the above condition.  Patient is emotional in the room, reports a scheduled court room appearance tomorrow due to her social situation with her daughter.  Would like to see psychiatry.  Denies any suicidal thoughts, plan or attempts.   ED/Hospital notes reviewed.    ROS:   Constitutional:  No f/c, No night sweats, No unexplained weight loss. EENT:  No vision changes, No blurry vision, No hearing changes. No mouth, throat, or ear problems.  Respiratory: No cough, No SOB Cardiac: No CP, no palpitations GI:  No abd pain, No N/V/D. GU: No Urinary s/sx Neuro: No headache, no dizziness, no motor weakness.  Skin: No rash Endocrine:  No polydipsia. No polyuria.  Psych: Denies SI/HI  No problems updated.  ALLERGIES: No Known Allergies  PAST MEDICAL HISTORY: Past Medical History:  Diagnosis Date   Carpal tunnel syndrome    Hx of Hashimoto thyroiditis    Hypothyroid     MEDICATIONS AT HOME: Prior to Admission medications   Medication Sig Start Date End Date Taking? Authorizing Provider  acyclovir (ZOVIRAX) 400 MG tablet Take 1 tablet by mouth twice daily 09/24/20   Grayce Sessions, NP  metroNIDAZOLE (FLAGYL) 500 MG tablet Take 1 tablet (500 mg total) by mouth 2 (two) times daily. 12/29/19   Grayce Sessions, NP     Objective:  EXAM:   Vitals:   10/04/20 1336  BP: 130/83  Pulse: 71  Temp: (!) 96.3 F (35.7 C)  TempSrc: Temporal  SpO2: 97%  Weight: 242 lb 9.6 oz (110 kg)  Height: 5\' 2"  (1.575 m)    General appearance : A&OX3. NAD. Non-toxic-appearing HEENT: Atraumatic and Normocephalic.  PERRLA.  EOM intact.  TM clear B. Mouth-MMM, post pharynx WNL w/o erythema, No PND. Neck: supple, no JVD. No cervical lymphadenopathy. No thyromegaly Chest/Lungs:  Breathing-non-labored, Good air entry bilaterally, breath sounds normal without rales, rhonchi, or wheezing  CVS: S1 S2 regular, no murmurs, gallops, rubs  Abdomen: Bowel sounds present, Non tender and not distended with no gaurding, rigidity or rebound. Psych:  TP linear. J/I WNL. Normal speech. Appropriate eye contact and affect.  Skin:  No Rash  Data Review Lab Results  Component Value Date   HGBA1C 5.5 04/05/2019   HGBA1C 5.4 01/04/2019   HGBA1C 5.7 (H) 03/12/2018     Assessment & Plan   1. Screening for STD (sexually transmitted disease) -Practice safe sex - Cervicovaginal ancillary only - HIV antibody (with reflex) - Hepatitis C Antibody  2. Adjustment disorder with mixed anxiety and depressed mood -Clinical social worker consult placed - Ambulatory referral to Social Work - Ambulatory referral to Psychiatry     Patient have been counseled extensively about nutrition and exercise  Return in about 3 months (around 01/04/2021) for Regular check up.  The patient was given clear instructions to go to ER or return to medical center if symptoms don't improve, worsen or new problems develop. The patient verbalized understanding. The patient was told to call to get lab results if they haven't heard anything in the next week.    13/12/2020, APRN, FNP-C [facility] [place], [place] [phone]   10/04/2020, 2:05 PM

## 2020-10-04 NOTE — Telephone Encounter (Signed)
Requested medications are due for refill today yes  Requested medications are on the active medication list yes  Last refill 01/09/20  Last visit today, 8/11  Notes to clinic Was taking this for vaginitis is Nov 2021, unsure if to be restarted.

## 2020-10-05 ENCOUNTER — Telehealth: Payer: Self-pay | Admitting: Clinical

## 2020-10-05 LAB — CERVICOVAGINAL ANCILLARY ONLY
Bacterial Vaginitis (gardnerella): NEGATIVE
Candida Glabrata: NEGATIVE
Candida Vaginitis: POSITIVE — AB
Chlamydia: POSITIVE — AB
Comment: NEGATIVE
Comment: NEGATIVE
Comment: NEGATIVE
Comment: NEGATIVE
Comment: NEGATIVE
Comment: NORMAL
Neisseria Gonorrhea: NEGATIVE
Trichomonas: NEGATIVE

## 2020-10-05 NOTE — Telephone Encounter (Signed)
LCSWA spoke with pt during visit with provider on 10/04/20. Pt mentioned that she needed to begin psychiatry and outpatient therapy again. Pt reports that she was previously a pt with [facility] but hasn't been seen in 2 years. Reports that she was diagnosed with bipolar disorder. Reports that she is having a custody battle for her two year old daughter. Reports that her friend has custody of her and she is trying to gain custody again. LCSWA scheduled visit with pt and will gather more information at visit in order to properly refer pt. Pt had to leave for a meeting. LCSWA will fu with pt at appt on 10/18/20.

## 2020-10-05 NOTE — Telephone Encounter (Signed)
PCP is out of the office.  Asante can you please reach out to the patient with regards to options for obtaining her mental health medications including the walk-in clinic at [facility]?  Thank you

## 2020-10-18 ENCOUNTER — Other Ambulatory Visit: Payer: Self-pay

## 2020-10-18 ENCOUNTER — Ambulatory Visit (INDEPENDENT_AMBULATORY_CARE_PROVIDER_SITE_OTHER): Payer: Medicaid Other | Admitting: Clinical

## 2020-10-18 DIAGNOSIS — R4587 Impulsiveness: Secondary | ICD-10-CM

## 2020-10-18 DIAGNOSIS — F4325 Adjustment disorder with mixed disturbance of emotions and conduct: Secondary | ICD-10-CM | POA: Diagnosis not present
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
## 2020-11-08 ENCOUNTER — Ambulatory Visit (INDEPENDENT_AMBULATORY_CARE_PROVIDER_SITE_OTHER): Payer: Medicaid Other | Admitting: Clinical

## 2020-12-06 ENCOUNTER — Ambulatory Visit (INDEPENDENT_AMBULATORY_CARE_PROVIDER_SITE_OTHER): Payer: Medicaid Other | Admitting: Clinical

## 2020-12-25 ENCOUNTER — Ambulatory Visit (HOSPITAL_COMMUNITY): Payer: Medicaid Other | Admitting: Clinical

## 2020-12-31 ENCOUNTER — Ambulatory Visit (HOSPITAL_COMMUNITY): Payer: Medicaid Other

## 2020-12-31 ENCOUNTER — Other Ambulatory Visit: Payer: Self-pay

## 2021-01-16 ENCOUNTER — Other Ambulatory Visit: Payer: Self-pay

## 2021-01-16 ENCOUNTER — Ambulatory Visit (HOSPITAL_COMMUNITY): Payer: Medicaid Other | Admitting: Clinical

## 2021-01-16 DIAGNOSIS — F331 Major depressive disorder, recurrent, moderate: Secondary | ICD-10-CM
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
## 2021-01-21 ENCOUNTER — Other Ambulatory Visit: Payer: Self-pay

## 2021-01-21 ENCOUNTER — Encounter (HOSPITAL_COMMUNITY): Payer: Self-pay | Admitting: *Deleted

## 2021-01-21 ENCOUNTER — Ambulatory Visit (HOSPITAL_COMMUNITY)
Admission: EM | Admit: 2021-01-21 | Discharge: 2021-01-21 | Disposition: A | Discharge status: Home / Self Care | Payer: Medicaid Other | Attending: Student | Admitting: Student

## 2021-01-21 DIAGNOSIS — N760 Acute vaginitis: Secondary | ICD-10-CM | POA: Insufficient documentation

## 2021-01-21 LAB — POC URINE PREG, ED: Preg Test, Ur: NEGATIVE

## 2021-01-21 MED ORDER — METRONIDAZOLE 500 MG PO TABS: 500.0000 mg | ORAL_TABLET | Freq: Two times a day (BID) | ORAL | 0 refills | Status: DC

## 2021-01-21 NOTE — ED Triage Notes (Signed)
Pt reports vomiting  

## 2021-01-21 NOTE — ED Provider Notes (Signed)
Alyssa Hartman    CSN: [id number] Arrival date & time: 01/21/21  1048      History   Chief Complaint Chief Complaint  Patient presents with   Emesis   Dysuria    HPI Alyssa Hartman is a 25 y.o. female presenting with 1 month of vaginal discharge with odor.  Medical history chlamydia, BV, yeast.  Describes 1 month of thin white vaginal discharge with odor. Same female partner.  Also with intermittent crampy lower abdominal pain left worse than right.  Denies flank pain, fever/chills, frequency.  HPI  Past Medical History:  Diagnosis Date   Carpal tunnel syndrome    Hx of Hashimoto thyroiditis    Hypothyroid     Patient Active Problem List   Diagnosis Date Noted   Pregnancy 03/18/2018   GBS (group B Streptococcus carrier), +RV culture, currently pregnant 03/16/2018   Gestational diabetes 02/03/2018   Unwanted fertility 12/08/2017   History of genetic disease 10/13/2017   History of prior pregnancy with IUGR newborn 10/13/2017   Biological false positive RPR test 12/06/2016   Supervision of high risk pregnancy, antepartum 07/28/2016   Hashimoto's thyroiditis 10/08/2012   Obesity 10/08/2012   Bipolar 1 disorder ([place]) 06/23/2011    Past Surgical History:  Procedure Laterality Date   NO PAST SURGERIES      OB History     Gravida  3   Para  3   Term  3   Preterm  0   AB      Living  3      SAB      IAB      Ectopic      Multiple  0   Live Births  3            Home Medications    Prior to Admission medications   Medication Sig Start Date End Date Taking? Authorizing Provider  acyclovir (ZOVIRAX) 400 MG tablet Take 1 tablet by mouth twice daily 09/24/20   Kerin Perna, NP  metroNIDAZOLE (FLAGYL) 500 MG tablet Take 1 tablet (500 mg total) by mouth 2 (two) times daily. Avoid alcohol while taking this medication and for 2 days after 01/21/21  Yes Hazel Sams, PA-C    Family History Family History  Problem Relation Age  of Onset   Diabetes Maternal Grandmother    Hypertension Maternal Grandmother     Social History Social History   Tobacco Use   Smoking status: Former    Types: Cigarettes    Quit date: 03/18/2013    Years since quitting: 7.8   Smokeless tobacco: Never  Vaping Use   Vaping Use: Never used  Substance Use Topics   Alcohol use: No   Drug use: No     Allergies   Patient has no known allergies.   Review of Systems Review of Systems  Constitutional:  Negative for chills and fever.  HENT:  Negative for sore throat.   Eyes:  Negative for pain and redness.  Respiratory:  Negative for shortness of breath.   Cardiovascular:  Negative for chest pain.  Gastrointestinal:  Positive for abdominal pain. Negative for diarrhea, nausea and vomiting.  Genitourinary:  Positive for vaginal discharge. Negative for decreased urine volume, difficulty urinating, dysuria, flank pain, frequency, genital sores, hematuria, urgency, vaginal bleeding and vaginal pain.  Musculoskeletal:  Negative for back pain.  Skin:  Negative for rash.    Physical Exam Triage Vital Signs ED Triage Vitals  Enc Vitals Group  BP 01/21/21 1340 125/69     Pulse Rate 01/21/21 1340 80     Resp 01/21/21 1340 20     Temp 01/21/21 1340 98.6 F (37 C)     Temp src --      SpO2 01/21/21 1340 100 %     Weight --      Height --      Head Circumference --      Peak Flow --      Pain Score 01/21/21 1338 0     Pain Loc --      Pain Edu? --      Excl. in [place]? --    No data found.  Updated Vital Signs BP 125/69   Pulse 80   Temp 98.6 F (37 C)   Resp 20   LMP 12/24/2020   SpO2 100%   Visual Acuity Right Eye Distance:   Left Eye Distance:   Bilateral Distance:    Right Eye Near:   Left Eye Near:    Bilateral Near:     Physical Exam Vitals reviewed.  Constitutional:      General: She is not in acute distress.    Appearance: Normal appearance. She is not ill-appearing.  HENT:     Head: Normocephalic  and atraumatic.     Mouth/Throat:     Mouth: Mucous membranes are moist.     Comments: Moist mucous membranes Eyes:     Extraocular Movements: Extraocular movements intact.     Pupils: Pupils are equal, round, and reactive to light.  Cardiovascular:     Rate and Rhythm: Normal rate and regular rhythm.     Heart sounds: Normal heart sounds.  Pulmonary:     Effort: Pulmonary effort is normal.     Breath sounds: Normal breath sounds. No wheezing, rhonchi or rales.  Abdominal:     General: Bowel sounds are normal. There is no distension.     Palpations: Abdomen is soft. There is no mass.     Tenderness: There is no abdominal tenderness. There is no right CVA tenderness, left CVA tenderness, guarding or rebound.  Skin:    General: Skin is warm.     Capillary Refill: Capillary refill takes less than 2 seconds.     Comments: Good skin turgor  Neurological:     General: No focal deficit present.     Mental Status: She is alert and oriented to person, place, and time.  Psychiatric:        Mood and Affect: Mood normal.        Behavior: Behavior normal.     UC Treatments / Results  Labs (all labs ordered are listed, but only abnormal results are displayed) Labs Reviewed  POC URINE PREG, ED  CERVICOVAGINAL ANCILLARY ONLY    EKG   Radiology No results found.  Procedures Procedures (including critical care time)  Medications Ordered in UC Medications - No data to display  Initial Impression / Assessment and Plan / UC Course  I have reviewed the triage vital signs and the nursing notes.  Pertinent labs & imaging results that were available during my care of the patient were reviewed by me and considered in my medical decision making (see chart for details).     This patient is a very pleasant 25 y.o. year old female presenting with suspected BV. Afebrile, nontachycardic, no reproducible abd pain or CVAT.  Last STI screen performed by family medicine 8/22 due to positive  for chlamydia and  yeast. Will send self-swab for G/C, trich, yeast, BV testing. Declines HIV, RPR. Safe sex precautions.   Flagyl sent.   ED return precautions discussed. Patient verbalizes understanding and agreement.   Coding Level 4 for review of past notes/labs, order and interpretation of labs today, and prescription drug management   Final Clinical Impressions(s) / UC Diagnoses   Final diagnoses:  Vaginitis and vulvovaginitis     Discharge Instructions      -For bacterial vaginosis, start the antibiotic-Flagyl (metronidazole), 2 pills daily for 7 days.  You can take this with food if you have a sensitive stomach.  Avoid alcohol while taking this medication and for 2 days after as this will cause severe nausea and vomiting. -We have sent testing for sexually transmitted infections. We will notify you of any positive results once they are received. If required, we will prescribe any medications you might need. Please refrain from all sexual activity until treatment is complete.  -Seek additional medical attention if you develop fevers/chills, new/worsening abdominal pain, new/worsening vaginal discomfort/discharge, etc.       ED Prescriptions     Medication Sig Dispense Auth. Provider   metroNIDAZOLE (FLAGYL) 500 MG tablet Take 1 tablet (500 mg total) by mouth 2 (two) times daily. Avoid alcohol while taking this medication and for 2 days after 14 tablet Rhys Martini, PA-C      PDMP not reviewed this encounter.   Rhys Martini, PA-C 01/21/21 1401

## 2021-01-21 NOTE — ED Triage Notes (Signed)
Vag DC with odor

## 2021-01-21 NOTE — Discharge Instructions (Addendum)
-  For bacterial vaginosis, start the antibiotic-Flagyl (metronidazole), 2 pills daily for 7 days.  You can take this with food if you have a sensitive stomach.  Avoid alcohol while taking this medication and for 2 days after as this will cause severe nausea and vomiting. -We have sent testing for sexually transmitted infections. We will notify you of any positive results once they are received. If required, we will prescribe any medications you might need. Please refrain from all sexual activity until treatment is complete.  -Seek additional medical attention if you develop fevers/chills, new/worsening abdominal pain, new/worsening vaginal discomfort/discharge, etc.   

## 2021-01-22 LAB — CERVICOVAGINAL ANCILLARY ONLY
Bacterial Vaginitis (gardnerella): POSITIVE — AB
Candida Glabrata: NEGATIVE
Candida Vaginitis: NEGATIVE
Chlamydia: NEGATIVE
Comment: NEGATIVE
Comment: NEGATIVE
Comment: NEGATIVE
Comment: NEGATIVE
Comment: NEGATIVE
Comment: NORMAL
Neisseria Gonorrhea: NEGATIVE
Trichomonas: NEGATIVE

## 2021-03-11 ENCOUNTER — Ambulatory Visit (HOSPITAL_COMMUNITY): Payer: Medicaid Other | Admitting: Clinical

## 2021-03-14 ENCOUNTER — Ambulatory Visit (HOSPITAL_COMMUNITY): Payer: Medicaid Other | Admitting: Student in an Organized Health Care Education/Training Program

## 2021-04-01 ENCOUNTER — Ambulatory Visit (HOSPITAL_COMMUNITY): Payer: Medicaid Other | Admitting: Clinical

## 2021-08-09 ENCOUNTER — Encounter (HOSPITAL_COMMUNITY): Payer: Self-pay

## 2021-08-09 ENCOUNTER — Emergency Department (HOSPITAL_COMMUNITY)
Admission: EM | Admit: 2021-08-09 | Discharge: 2021-08-09 | Disposition: A | Discharge status: Home / Self Care | Payer: Medicaid Other | Attending: Emergency Medicine | Admitting: Emergency Medicine

## 2021-08-09 ENCOUNTER — Other Ambulatory Visit: Payer: Self-pay

## 2021-08-09 DIAGNOSIS — Z3201 Encounter for pregnancy test, result positive: Secondary | ICD-10-CM | POA: Diagnosis present

## 2021-08-09 LAB — PREGNANCY, URINE: Preg Test, Ur: POSITIVE — AB

## 2021-08-09 NOTE — ED Provider Triage Note (Signed)
Emergency Medicine Provider Triage Evaluation Note  Alyssa Hartman , a 26 y.o. female  was evaluated in triage.  Pt complains of possible pregnancy. Had positive pregnancy test at home and was attempting to be evaluated by the health department today, however, they ran out of pregnancy tests and was told to come into the ED for a pregnancy test.   Review of Systems  Positive: As per HPI above Negative:   Physical Exam  BP 114/78 (BP Location: Right Arm)   Pulse 77   Temp 98.8 F (37.1 C) (Oral)   Resp 18   LMP 06/24/2021   SpO2 100%  Gen:   Awake, no distress   Resp:  Normal effort  MSK:   Moves extremities without difficulty  Other:    Medical Decision Making  Medically screening exam initiated at 3:04 PM.  Appropriate orders placed.  [name] was informed that the remainder of the evaluation will be completed by another provider, this initial triage assessment does not replace that evaluation, and the importance of remaining in the ED until their evaluation is complete.  Work-up initiated    Kayah Hecker A, PA-C 08/09/21 1511

## 2021-08-09 NOTE — ED Triage Notes (Signed)
Reports possibly pregnant and took home pregnancy test that were positive.  Was trying to go to health department but they wanted documentation of pregnancy and EDD

## 2021-08-09 NOTE — ED Provider Notes (Signed)
[facility] EMERGENCY DEPARTMENT Provider Note   CSN: [id number] Arrival date & time: 08/09/21  1445     History  Chief Complaint  Patient presents with   Possible Pregnancy    Alyssa Hartman is a 26 y.o. female.  26 year old female, G4 [id number], approximately [redacted] weeks pregnant based on last menstrual period on May 1 presents to the ED after she was found to have a positive pregnancy test at home.  Patient states that she contacted the health department and was advised to present to the emergency room for estimated delivery date.  She states that she has had episodes of nausea but denies vomiting, abdominal pain, vaginal bleeding, or abnormal vaginal discharge.  The history is provided by the patient.       Home Medications Prior to Admission medications   Medication Sig Start Date End Date Taking? Authorizing Provider  acyclovir (ZOVIRAX) 400 MG tablet Take 1 tablet by mouth twice daily 09/24/20   Grayce Sessions, NP  metroNIDAZOLE (FLAGYL) 500 MG tablet Take 1 tablet (500 mg total) by mouth 2 (two) times daily. Avoid alcohol while taking this medication and for 2 days after 01/21/21   Rhys Martini, PA-C      Allergies    Patient has no known allergies.    Review of Systems   Review of Systems  Constitutional:  Negative for fever.  Gastrointestinal:  Positive for nausea. Negative for abdominal pain, diarrhea and vomiting.  Genitourinary:  Negative for dysuria, urgency, vaginal bleeding, vaginal discharge and vaginal pain.    Physical Exam Updated Vital Signs BP 114/78 (BP Location: Right Arm)   Pulse 77   Temp 98.8 F (37.1 C) (Oral)   Resp 18   LMP 06/24/2021   SpO2 100%  Physical Exam Vitals and nursing note reviewed.  Constitutional:      General: She is not in acute distress.    Appearance: Normal appearance. She is well-developed. She is obese. She is not ill-appearing.  HENT:     Head: Normocephalic.  Cardiovascular:     Rate and  Rhythm: Normal rate and regular rhythm.     Heart sounds: No murmur heard. Pulmonary:     Effort: Pulmonary effort is normal. No respiratory distress.     Breath sounds: Normal breath sounds.  Abdominal:     Palpations: Abdomen is soft.     Tenderness: There is no abdominal tenderness. There is no guarding.  Skin:    General: Skin is warm and dry.  Neurological:     Mental Status: She is alert.     ED Results / Procedures / Treatments   Labs (all labs ordered are listed, but only abnormal results are displayed) Labs Reviewed  PREGNANCY, URINE - Abnormal; Notable for the following components:      Result Value   Preg Test, Ur POSITIVE (*)    All other components within normal limits    EKG None  Radiology No results found.  Procedures Procedures    Medications Ordered in ED Medications - No data to display  ED Course/ Medical Decision Making/ A&P                           Medical Decision Making Amount and/or Complexity of Data Reviewed Labs: ordered.   26 year old female with a history as above presents following a home pregnancy test.  Estimated [redacted] weeks pregnant based on last menstrual period.  On arrival,  patient is well-appearing and hemodynamically stable.  She denies abdominal pain or vaginal bleeding, abdomen is soft and nontender on exam.  At this time, patient does not require emergent evaluation for ectopic pregnancy given that she is asymptomatic.  She has no urinary symptoms thus lower suspicion for acute cystitis or pyelonephritis.  Shared decision making with the patient who is comfortable with plan for outpatient follow-up with OB/GYN.  Obstetrics clinic number placed in AVS.  Strict ED return precautions given including development of vaginal bleeding or lower abdominal pain.  At this time, patient is stable for discharge home.  Final Clinical Impression(s) / ED Diagnoses Final diagnoses:  Positive pregnancy test    Rx / DC Orders ED Discharge  Orders     None         Asaiah Scarber, [place], MD 08/09/21 2337    Tegeler, Canary Brim, MD 08/12/21 0007

## 2021-08-09 NOTE — ED Notes (Signed)
RN reviewed discharge instructions w/ pt. Follow up reviewed, pt had no further questions 

## 2021-08-09 NOTE — Discharge Instructions (Signed)
You are approximately [redacted] weeks pregnant based on your last period on May 1st. Your estimated due date is February 24th, 2024. Please contact the number above to schedule your OBGYN appointment.

## 2021-09-03 ENCOUNTER — Telehealth (INDEPENDENT_AMBULATORY_CARE_PROVIDER_SITE_OTHER): Payer: Self-pay

## 2021-09-03 DIAGNOSIS — O0990 Supervision of high risk pregnancy, unspecified, unspecified trimester: Secondary | ICD-10-CM

## 2021-09-03 NOTE — Progress Notes (Signed)
Called pt at 0815; busy signal. Direct link to video visit sent via phone number and email. Called pt at [phone]; busy signal. MyChart message sent to reschedule patient.   Fleet Contras RN 09/03/21

## 2021-09-11 ENCOUNTER — Other Ambulatory Visit (INDEPENDENT_AMBULATORY_CARE_PROVIDER_SITE_OTHER): Payer: Self-pay | Admitting: Primary Care

## 2021-09-11 NOTE — Telephone Encounter (Addendum)
Medication Refill - Medication: Prenatal vitamins, (pt doesn't know the name of it)acyclovir (ZOVIRAX) 400 MG tablet   Has the patient contacted their pharmacy? yes (Agent: If no, request that the patient contact the pharmacy for the refill. If patient does not wish to contact the pharmacy document the reason why and proceed with request.) (Agent: If yes, when and what did the pharmacy advise?)contact pcp  Preferred Pharmacy (with phone number or street name):  [facility] - [place], [place] - [address] Phone:  [phone]  Fax:  [phone]     [phone]. Fx [phone]  Has the patient been seen for an appointment in the last year OR does the patient have an upcoming appointment? yes  Agent: Please be advised that RX refills may take up to 3 business days. We ask that you follow-up with your pharmacy.

## 2021-09-12 MED ORDER — ACYCLOVIR 400 MG PO TABS: 400.0000 mg | ORAL_TABLET | Freq: Two times a day (BID) | ORAL | 0 refills | Status: DC

## 2021-09-12 NOTE — Telephone Encounter (Signed)
Requested Prescriptions  Pending Prescriptions Disp Refills  . Prenatal Vit-Fe Fumarate-FA (PRENATAL MULTIVITAMIN) TABS tablet      Sig: Take 1 tablet by mouth daily at 12 noon.     There is no refill protocol information for this order    . acyclovir (ZOVIRAX) 400 MG tablet 180 tablet 0    Sig: Take 1 tablet (400 mg total) by mouth 2 (two) times daily.     Antimicrobials:  Antiviral Agents - Anti-Herpetic Passed - 09/11/2021 11:20 AM      Passed - Valid encounter within last 12 months    Recent Outpatient Visits          11 months ago Screening for STD (sexually transmitted disease)   [facility] Eleonore Chiquito, FNP   1 year ago Vaginal discharge   [facility] Grayce Sessions, NP   2 years ago Vaginal discharge   [facility] Grayce Sessions, NP   2 years ago Vaginal discharge   [facility] Grayce Sessions, NP   2 years ago Encounter for Papanicolaou smear for cervical cancer screening   [facility] Grayce Sessions, NP

## 2021-09-12 NOTE — Telephone Encounter (Signed)
Requested medication (s) are due for refill today: yes  Requested medication (s) are on the active medication list: no  Last refill:  10/29/17  Future visit scheduled:no  Notes to clinic:  Unable to refill per protocol, Rx expired.Medication was discontinued 10/29/17 by PCP, routing for review. Historical provider      Requested Prescriptions  Pending Prescriptions Disp Refills   Prenatal Vit-Fe Fumarate-FA (PRENATAL MULTIVITAMIN) TABS tablet      Sig: Take 1 tablet by mouth daily at 12 noon.     There is no refill protocol information for this order    Signed Prescriptions Disp Refills   acyclovir (ZOVIRAX) 400 MG tablet 180 tablet 0    Sig: Take 1 tablet (400 mg total) by mouth 2 (two) times daily.     Antimicrobials:  Antiviral Agents - Anti-Herpetic Passed - 09/11/2021 11:20 AM      Passed - Valid encounter within last 12 months    Recent Outpatient Visits           11 months ago Screening for STD (sexually transmitted disease)   [facility] Eleonore Chiquito, FNP   1 year ago Vaginal discharge   [facility] Grayce Sessions, NP   2 years ago Vaginal discharge   [facility] Grayce Sessions, NP   2 years ago Vaginal discharge   [facility] Grayce Sessions, NP   2 years ago Encounter for Papanicolaou smear for cervical cancer screening   [facility] Grayce Sessions, NP

## 2021-09-24 ENCOUNTER — Ambulatory Visit (HOSPITAL_COMMUNITY)
Admission: EM | Admit: 2021-09-24 | Discharge: 2021-09-24 | Disposition: A | Discharge status: Home / Self Care | Payer: Medicaid Other | Attending: Physician Assistant | Admitting: Physician Assistant

## 2021-09-24 ENCOUNTER — Encounter (HOSPITAL_COMMUNITY): Payer: Self-pay

## 2021-09-24 DIAGNOSIS — Z3A13 13 weeks gestation of pregnancy: Secondary | ICD-10-CM

## 2021-09-24 DIAGNOSIS — M79621 Pain in right upper arm: Secondary | ICD-10-CM | POA: Diagnosis not present

## 2021-09-24 DIAGNOSIS — L02411 Cutaneous abscess of right axilla: Secondary | ICD-10-CM | POA: Diagnosis not present

## 2021-09-24 DIAGNOSIS — O99711 Diseases of the skin and subcutaneous tissue complicating pregnancy, first trimester: Secondary | ICD-10-CM

## 2021-09-24 DIAGNOSIS — M25521 Pain in right elbow: Secondary | ICD-10-CM

## 2021-09-24 DIAGNOSIS — L0291 Cutaneous abscess, unspecified: Secondary | ICD-10-CM

## 2021-09-24 MED ORDER — MUPIROCIN CALCIUM 2 % EX CREA: 1.0000 | TOPICAL_CREAM | Freq: Two times a day (BID) | CUTANEOUS | 0 refills | Status: DC

## 2021-09-24 MED ORDER — CEPHALEXIN 500 MG PO CAPS: 500.0000 mg | ORAL_CAPSULE | Freq: Three times a day (TID) | ORAL | 0 refills | Status: DC

## 2021-09-24 NOTE — Discharge Instructions (Addendum)
Advised to use the Bactroban ointment, apply to area 2-3 times a day lightly. Advised take Keflex 500 mg 1 every 8 hours to treat the cyst infection. Advised to follow-up with PCP or return to urgent care if symptoms fail to improve.

## 2021-09-24 NOTE — ED Triage Notes (Signed)
Pt states abscess to right armpit for the past week. Pt is [redacted] weeks pregnant.

## 2021-09-24 NOTE — ED Provider Notes (Signed)
[facility]    CSN: [id number] Arrival date & time: 09/24/21  3016      History   Chief Complaint Chief Complaint  Patient presents with   Abscess    HPI Alyssa Hartman is a 26 y.o. female.   26 year old female presents with a cyst under her right arm.  Patient is [redacted] weeks pregnant.  Patient indicates she has a history of having recurrent cystic formations due to hidradenitis suppurativa that is worse on the right axilla.  Patient relates for the past week she has been having a cystic formation develop in the right arm.,  This has been draining intermittently, it is painful and tender.  Patient indicates she has ran a low-grade fever over the past couple days, she is using warm compresses to the area which tends to help.  Patient indicates that the cystic formations tend to occur more often when she is using regular deodorant, she did use a deodorant spray that did not have a luminal content to it but this burned the cyst area and caused a stinging sensation.  Patient relates she has no cystic formations under the left axilla.   Abscess   Past Medical History:  Diagnosis Date   Carpal tunnel syndrome    Hx of Hashimoto thyroiditis    Hypothyroid     Patient Active Problem List   Diagnosis Date Noted   Pregnancy 03/18/2018   GBS (group B Streptococcus carrier), +RV culture, currently pregnant 03/16/2018   Gestational diabetes 02/03/2018   Unwanted fertility 12/08/2017   History of genetic disease 10/13/2017   History of prior pregnancy with IUGR newborn 10/13/2017   Biological false positive RPR test 12/06/2016   Supervision of high risk pregnancy, antepartum 07/28/2016   Hashimoto's thyroiditis 10/08/2012   Obesity 10/08/2012   Bipolar 1 disorder (HCC) 06/23/2011    Past Surgical History:  Procedure Laterality Date   NO PAST SURGERIES      OB History     Gravida  4   Para  3   Term  3   Preterm  0   AB      Living  3      SAB      IAB       Ectopic      Multiple  0   Live Births  3            Home Medications    Prior to Admission medications   Medication Sig Start Date End Date Taking? Authorizing Provider  acyclovir (ZOVIRAX) 400 MG tablet Take 1 tablet (400 mg total) by mouth 2 (two) times daily. 09/12/21   Grayce Sessions, NP  cephALEXin (KEFLEX) 500 MG capsule Take 1 capsule (500 mg total) by mouth 3 (three) times daily. 09/24/21   Ellsworth Lennox, PA-C  metroNIDAZOLE (FLAGYL) 500 MG tablet Take 1 tablet (500 mg total) by mouth 2 (two) times daily. Avoid alcohol while taking this medication and for 2 days after 01/21/21   Rhys Martini, PA-C  mupirocin cream (BACTROBAN) 2 % Apply 1 Application topically 2 (two) times daily. 09/24/21   Ellsworth Lennox, PA-C    Family History Family History  Problem Relation Age of Onset   Diabetes Maternal Grandmother    Hypertension Maternal Grandmother     Social History Social History   Tobacco Use   Smoking status: Former    Types: Cigarettes    Quit date: 03/18/2013    Years since quitting: 8.5  Smokeless tobacco: Never  Vaping Use   Vaping Use: Never used  Substance Use Topics   Alcohol use: Yes   Drug use: No     Allergies   Patient has no known allergies.   Review of Systems Review of Systems  Skin:  Positive for wound (right axilla abcess).     Physical Exam Triage Vital Signs ED Triage Vitals  Enc Vitals Group     BP 09/24/21 1035 101/71     Pulse Rate 09/24/21 1035 84     Resp 09/24/21 1035 16     Temp 09/24/21 1035 99.4 F (37.4 C)     Temp Source 09/24/21 1035 Oral     SpO2 09/24/21 1035 98 %     Weight --      Height --      Head Circumference --      Peak Flow --      Pain Score 09/24/21 1037 0     Pain Loc --      Pain Edu? --      Excl. in GC? --    No data found.  Updated Vital Signs BP 101/71 (BP Location: Left Arm)   Pulse 84   Temp 99.4 F (37.4 C) (Oral)   Resp 16   LMP 06/24/2021   SpO2 98%   Visual  Acuity Right Eye Distance:   Left Eye Distance:   Bilateral Distance:    Right Eye Near:   Left Eye Near:    Bilateral Near:     Physical Exam Constitutional:      Appearance: Normal appearance.  Skin:    Comments: Right axilla: There is a 1.5 cm x 1.25 cm erythematous abscess present with drainage.  The area is without swelling, and there is no streaking associated.  The rest of the axilla is normal.  Neurological:     Mental Status: She is alert.      UC Treatments / Results  Labs (all labs ordered are listed, but only abnormal results are displayed) Labs Reviewed - No data to display  EKG   Radiology No results found.  Procedures Procedures (including critical care time)  Medications Ordered in UC Medications - No data to display  Initial Impression / Assessment and Plan / UC Course  I have reviewed the triage vital signs and the nursing notes.  Pertinent labs & imaging results that were available during my care of the patient were reviewed by me and considered in my medical decision making (see chart for details).    Plan: 1.  Advised to use the Bactroban ointment apply lightly to the area twice a day until healing occurs. 2.  Advised to take Keflex 500 mg 1 3 times a day until completed. 3.  Advised to use warm compresses on a frequent basis to encourage drainage. 4.  Advised follow-up PCP or return to urgent care if symptoms fail to improve. Final Clinical Impressions(s) / UC Diagnoses   Final diagnoses:  Abscess  Arthralgia of right upper arm     Discharge Instructions      Advised to use the Bactroban ointment, apply to area 2-3 times a day lightly. Advised take Keflex 500 mg 1 every 8 hours to treat the cyst infection. Advised to follow-up with PCP or return to urgent care if symptoms fail to improve.    ED Prescriptions     Medication Sig Dispense Auth. Provider   mupirocin cream (BACTROBAN) 2 %  (Status: Discontinued) Apply 1  Application  topically 2 (two) times daily. 15 g Nyoka Lint, PA-C   cephALEXin (KEFLEX) 500 MG capsule  (Status: Discontinued) Take 1 capsule (500 mg total) by mouth 3 (three) times daily. 21 capsule Nyoka Lint, PA-C   cephALEXin (KEFLEX) 500 MG capsule  (Status: Discontinued) Take 1 capsule (500 mg total) by mouth 3 (three) times daily. 21 capsule Nyoka Lint, PA-C   mupirocin cream (BACTROBAN) 2 %  (Status: Discontinued) Apply 1 Application topically 2 (two) times daily. 15 g Nyoka Lint, PA-C   cephALEXin (KEFLEX) 500 MG capsule Take 1 capsule (500 mg total) by mouth 3 (three) times daily. 21 capsule Nyoka Lint, PA-C   mupirocin cream (BACTROBAN) 2 % Apply 1 Application topically 2 (two) times daily. 15 g Nyoka Lint, PA-C      PDMP not reviewed this encounter.   Nyoka Lint, PA-C 09/24/21 1114

## 2021-09-26 ENCOUNTER — Telehealth (INDEPENDENT_AMBULATORY_CARE_PROVIDER_SITE_OTHER): Payer: Medicaid Other | Admitting: *Deleted

## 2021-09-26 DIAGNOSIS — O09299 Supervision of pregnancy with other poor reproductive or obstetric history, unspecified trimester: Secondary | ICD-10-CM

## 2021-09-26 DIAGNOSIS — E063 Autoimmune thyroiditis: Secondary | ICD-10-CM

## 2021-09-26 DIAGNOSIS — F32A Depression, unspecified: Secondary | ICD-10-CM

## 2021-09-26 DIAGNOSIS — Z87898 Personal history of other specified conditions: Secondary | ICD-10-CM

## 2021-09-26 DIAGNOSIS — O0990 Supervision of high risk pregnancy, unspecified, unspecified trimester: Secondary | ICD-10-CM | POA: Insufficient documentation

## 2021-09-26 DIAGNOSIS — O99210 Obesity complicating pregnancy, unspecified trimester: Secondary | ICD-10-CM

## 2021-09-26 DIAGNOSIS — O352XX0 Maternal care for (suspected) hereditary disease in fetus, not applicable or unspecified: Secondary | ICD-10-CM

## 2021-09-26 DIAGNOSIS — Z658 Other specified problems related to psychosocial circumstances: Secondary | ICD-10-CM | POA: Insufficient documentation

## 2021-09-26 DIAGNOSIS — Z3A00 Weeks of gestation of pregnancy not specified: Secondary | ICD-10-CM

## 2021-09-26 DIAGNOSIS — F419 Anxiety disorder, unspecified: Secondary | ICD-10-CM

## 2021-09-26 MED ORDER — GOJJI WEIGHT SCALE MISC: 1.0000 | 0 refills | Status: DC | PRN

## 2021-09-26 MED ORDER — BLOOD PRESSURE KIT DEVI: 1.0000 | 0 refills | Status: DC | PRN

## 2021-09-26 MED ORDER — PRENATAL VITAMIN 27-0.8 MG PO TABS: 1.0000 | ORAL_TABLET | Freq: Every day | ORAL | 11 refills | Status: AC

## 2021-09-26 NOTE — Progress Notes (Signed)
New OB Intake  I connected with  Alyssa Hartman on 09/26/21 at 10:15 AM EDT by MyChart Video Visit and verified that I am speaking with the correct person using two identifiers. Nurse is located at [facility] and pt is located at home.  I discussed the limitations, risks, security and privacy concerns of performing an evaluation and management service by telephone and the availability of in person appointments. I also discussed with the patient that there may be a patient responsible charge related to this service. The patient expressed understanding and agreed to proceed.  I explained I am completing New OB Intake today. We discussed her EDD of 03/31/21 that is based on LMP of 06/24/21. She also informed me she had went to [facility] and they did [place] which showed EDD a few days different. I asked her to call them and request they send [place] report to [place].  Pt is G4/P3003. I reviewed her allergies, medications, Medical/Surgical/OB history, and appropriate screenings. I informed her of [facility] services. [facility] information placed in [facility] referral offered to patient due to score, patient accepted referral and appointment was made.  Aneta informed me she lives at [facility]. She also reports she is being treated for abcess under her arm and has had these many times. 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 She states the FOB of first 2 children had BOR syndrome also and that this pregnancy has different FOB.   Patient Active Problem List   Diagnosis Date Noted   Supervision of high risk pregnancy, antepartum 09/26/2021   Psychosocial stressors 09/26/2021   Pregnancy 03/18/2018   GBS (group B Streptococcus carrier), +RV culture, currently pregnant 03/16/2018   Gestational diabetes 02/03/2018   Unwanted fertility 12/08/2017   History of genetic disease 10/13/2017   History of prior  pregnancy with IUGR newborn 10/13/2017   Biological false positive RPR test 12/06/2016   Hashimoto's thyroiditis 10/08/2012   Obesity 10/08/2012   Bipolar 1 disorder (HCC) 06/23/2011    Concerns addressed today  Delivery Plans Plans to deliver at [facility]. Patient given information for [facility] website for more [facility]. Patient is not interested in water birth. Offered upcoming OB visit with CNM to discuss further.  MyChart/Babyscripts MyChart access verified. I explained pt will have some visits in office and some virtually. Babyscripts instructions given and order placed.   Blood Pressure Cuff/Weight Scale Blood pressure cuff ordered for patient to pick-up from [employer]. Explained after first prenatal appt pt will check weekly and document in Babyscripts. Patient does not  have weight scale. Weight scale ordered for patient to pick up from [employer].   Anatomy US Explained first scheduled [place] will be around 19 weeks. Anatomy US scheduled for 11/04/21 at 130. Pt notified to arrive at 115.  Virtual visit disconnected before visit completed. Called patient twice but did not connect with patient.  Social Determinants of Health Food Insecurity: Patient denies food insecurity. WIC Referral: Patient no offerd due to disconnect.   Transportation: Patient denies transportation needs. Childcare: Discussed no children allowed at ultrasound appointments. Offered childcare services; patient declines childcare services at this time.  First visit review I reviewed new OB appt with pt. I explained she will have a provider visit that includes exam, bloodwork.. Explained pt will be seen by Edd Arbour, CNM  at first visit; encounter routed to appropriate provider. Explained that patient will be seen by pregnancy navigator following visit with provider.   Nancy Fetter 09/26/2021  12:23 PM

## 2021-09-26 NOTE — Patient Instructions (Signed)
  At our [facility], we work as an integrated team, providing care to address both physical and emotional health. Your medical provider may refer you to see our Behavioral Health Clinician (BHC) on the same day you see your medical provider, as availability permits; often scheduled virtually at your convenience.  Our BHC is available to all patients, visits generally last between 20-30 minutes, but can be longer or shorter, depending on patient need. The BHC offers help with stress management, coping with symptoms of depression and anxiety, major life changes , sleep issues, changing risky behavior, grief and loss, life stress, working on personal life goals, and  behavioral health issues, as these all affect your overall health and wellness.  The BHC is NOT available for the following: FMLA paperwork, court-ordered evaluations, specialty assessments (custody or disability), letters to employers, or obtaining certification for an emotional support animal. The BHC does not provide long-term therapy. You have the right to refuse integrated behavioral health services, or to reschedule to see the BHC at a later date.  Confidentiality exception: If it is suspected that a child or disabled adult is being abused or neglected, we are required by law to report that to either Child Protective Services or Adult Protective Services.  If you have a diagnosis of Bipolar affective disorder, Schizophrenia, or recurrent Major depressive disorder, we will recommend that you establish care with a psychiatrist, as these are lifelong, chronic conditions, and we want your overall emotional health and medications to be more closely monitored. If you anticipate needing extended maternity leave due to mental health issues postpartum, it it recommended you inform your medical provider, so we can put in a referral to a psychiatrist as soon as possible. The BHC is unable to recommend an extended maternity leave for mental  health issues. Your medical provider or BHC may refer you to a therapist for ongoing, traditional therapy, or to a psychiatrist, for medication management, if it would benefit your overall health. Depending on your insurance, you may have a copay or be charged a deductible, depending on your insurance, to see the BHC. If you are uninsured, it is recommended that you apply for financial assistance. (Forms may be requested at the front desk for in-person visits, via MyChart, or request a form during a virtual visit).  If you see the BHC more than 6 times, you will have to complete a comprehensive clinical assessment interview with the BHC to resume integrated services.  For virtual visits with the BHC, you must be physically in the state of [place] at the time of the visit. For example, if you live in [place], you will have to do an in-person visit with the BHC, and your out-of-state insurance may not cover behavioral health services in [place]. If you are going out of the state or country for any reason, the BHC may see you virtually when you return to [place], but not while you are physically outside of .    

## 2021-10-01 ENCOUNTER — Encounter: Payer: Self-pay | Admitting: Certified Nurse Midwife

## 2021-10-01 NOTE — BH Specialist Note (Signed)
Error

## 2021-10-02 ENCOUNTER — Other Ambulatory Visit: Payer: Self-pay

## 2021-10-02 ENCOUNTER — Ambulatory Visit (INDEPENDENT_AMBULATORY_CARE_PROVIDER_SITE_OTHER): Payer: Medicaid Other | Admitting: Certified Nurse Midwife

## 2021-10-02 ENCOUNTER — Other Ambulatory Visit (HOSPITAL_COMMUNITY)
Admission: RE | Admit: 2021-10-02 | Discharge: 2021-10-02 | Disposition: A | Discharge status: Home / Self Care | Payer: Medicaid Other | Source: Ambulatory Visit | Attending: Certified Nurse Midwife | Admitting: Certified Nurse Midwife

## 2021-10-02 VITALS — BP 115/79 | HR 101 | Wt 265.3 lb

## 2021-10-02 DIAGNOSIS — O0932 Supervision of pregnancy with insufficient antenatal care, second trimester: Secondary | ICD-10-CM

## 2021-10-02 DIAGNOSIS — Z8632 Personal history of gestational diabetes: Secondary | ICD-10-CM | POA: Diagnosis not present

## 2021-10-02 DIAGNOSIS — Z3A14 14 weeks gestation of pregnancy: Secondary | ICD-10-CM | POA: Diagnosis not present

## 2021-10-02 DIAGNOSIS — O0992 Supervision of high risk pregnancy, unspecified, second trimester: Secondary | ICD-10-CM

## 2021-10-02 DIAGNOSIS — Z59819 Housing instability, housed unspecified: Secondary | ICD-10-CM | POA: Diagnosis not present

## 2021-10-02 DIAGNOSIS — E039 Hypothyroidism, unspecified: Secondary | ICD-10-CM

## 2021-10-02 DIAGNOSIS — O0990 Supervision of high risk pregnancy, unspecified, unspecified trimester: Secondary | ICD-10-CM

## 2021-10-02 NOTE — Progress Notes (Signed)
History:   Alyssa Hartman is a 26 y.o. [phone] at 12w2dby LMP being seen today for her first obstetrical visit.  Her obstetrical history is significant for  exposure to hepatitis C, unstable housing situation (is at [facility] other children are with family/friends), previous children with different father have BOR syndrome, one child had IUGR . Patient does intend to breast feed. Pregnancy history fully reviewed.  Patient reports no complaints.      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)     Name: WILVeatrice Kells  Apgar1: 8  Apgar5: 9  1 Term 01/18/16 38w54w2dlb 14 oz (2.665 kg) M Vag-Vacuum  N LIV     Birth Comments: Induced due to 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     Complications: IUGR (intrauterine growth restriction) affecting care of mother    Last pap smear was done 04/22/19 and was normal  Past Medical History:  Diagnosis Date   Abscess of right axilla    Anxiety    BV (bacterial vaginosis)    Carpal tunnel syndrome    Depression    GBS (group B Streptococcus carrier), +RV culture, currently pregnant 03/16/2018   GDM (gestational diabetes mellitus)    Gestational diabetes 02/03/2018   Current Diabetic Medications:  None   Required Referrals for A1GDM or A2GDM: _1  Diabetes Education and Testing Supplies _2  Nutrition Cousult   Baseline and  surve[facility]  Lab Results  Component Value Date   CREATININE 0.96 05/08/2017   AST 32 05/08/2017   ALT 29 05/08/2017   TSH 3.830 11/10/2017   Lab Results  Component Value Date   HGBA1C 5.7 (   HSV-1 (herpes simplex virus 1) infection    Hx of Hashimoto thyroiditis    Hypothyroid    Past Surgical History:  Procedure Laterality Date   NO PAST SURGERIES     Family History  Problem Relation Age of Onset   Asthma Sister    Thyroid disease Sister    Diabetes Maternal Grandmother    Hypertension Maternal Grandmother    Social History   Tobacco Use   Smoking status: Former    Types: Cigarettes    Quit date: 03/18/2013    Years since quitting: 8.5   Smokeless tobacco: Never  Vaping Use   Vaping Use: Never used  Substance Use Topics   Alcohol use: Not Currently   Drug use: Not Currently    Types: Marijuana    Comment: last used January 2023   No Known Allergies Current Outpatient Medications on File Prior to Visit  Medication Sig  Dispense Refill   acyclovir (ZOVIRAX) 400 MG tablet Take 1 tablet (400 mg total) by mouth 2 (two) times daily. 180 tablet 0   Blood Pressure Monitoring (BLOOD PRESSURE KIT) DEVI 1 Device by Does not apply route as needed. 1 each 0   Misc. Devices (GOJJI WEIGHT SCALE) MISC 1 Device by Does not apply route as needed. 1 each 0   Prenatal Vit-Fe Fumarate-FA (PRENATAL VITAMIN) 27-0.8 MG TABS Take 1 tablet by mouth daily. 30 tablet 11   Prenatal Vit-Fe Fumarate-FA (PRENATAL VITAMINS PO) Take by mouth.     cephALEXin (KEFLEX) 500 MG capsule Take 1 capsule (500 mg total) by mouth 3 (three) times daily. (Patient not taking: Reported on 09/26/2021) 21 capsule 0   metroNIDAZOLE (FLAGYL) 500 MG tablet Take 1 tablet (500 mg total) by mouth 2 (two) times daily. Avoid alcohol while taking this medication and for 2 days after 14 tablet 0   mupirocin cream (BACTROBAN) 2 % Apply 1 Application topically 2 (two) times daily.  (Patient not taking: Reported on 09/26/2021) 15 g 0   No current facility-administered medications on file prior to visit.    Review of Systems Pertinent items noted in HPI and remainder of comprehensive ROS otherwise negative. Physical Exam:   Vitals:   10/02/21 1503  BP: 115/79  Pulse: (!) 101  Weight: 265 lb 4.8 oz (120.3 kg)   Fetal Heart Rate (bpm): 150  Constitutional: Well-developed, obese pregnant female in no acute distress.  HEENT: PERRLA Skin: normal color and turgor, no rash Cardiovascular: normal rate & rhythm, warm and well perfused Respiratory: normal effort, no problems with respiration noted GI: Abd soft, non-tender MS: Extremities nontender, no edema, normal ROM Neurologic: Alert and oriented x 4.  GU: no CVA tenderness Pelvic: exam deferred  Assessment:    Pregnancy: [id number] Patient Active Problem List   Diagnosis Date Noted   Supervision of high risk pregnancy, antepartum 09/26/2021   Psychosocial stressors 09/26/2021   Pregnancy 03/18/2018   Unwanted fertility 12/08/2017   History of genetic disease 10/13/2017   History of prior pregnancy with IUGR newborn 10/13/2017   Biological false positive RPR test 12/06/2016   Hashimoto's thyroiditis 10/08/2012   Obesity 10/08/2012   Bipolar 1 disorder ([place]) 06/23/2011     Plan:  1. Supervision of high risk pregnancy in second trimester - GC/Chlamydia probe amp ([place])not at [facility] - Culture, OB Urine - CBC/D/Plt+RPR+Rh+ABO+RubIgG... - TSH  2. [redacted] weeks gestation of pregnancy - Routine OB care, will get AFP at next visit  3. Housing situation unstable - Living at [facility], children are not with her right now  4. History of gestational diabetes - Hemoglobin A1c  5. Initial obstetric visit in second trimester - Initial labs drawn. - Continue prenatal vitamins. - Problem list reviewed and updated. - Genetic Screening discussed, First trimester screen, Quad screen, and NIPS: ordered. -  Ultrasound discussed; fetal anatomic survey: ordered. - Anticipatory guidance about prenatal visits given including labs, ultrasounds, and testing. - Discussed usage of Babyscripts and virtual visits as additional source of managing and completing prenatal visits in midst of coronavirus and pandemic.   - Encouraged to complete MyChart Registration for her ability to review results, send requests, and have questions addressed.  - The nature of [place] for [facility]Faculty Practice with multiple MDs and Advanced Practice Providers was explained to patient; also emphasized that residents, students are part of our team. - Routine obstetric precautions reviewed. Encouraged to seek  out care at office or emergency room [place] MAU preferred) for urgent and/or emergent concerns.  Return in about 4 weeks (around 10/30/2021) for IN-PERSON, LOB.    Gaylan Gerold, MSN, CNM, [place] Certified Nurse Midwife, [facility]

## 2021-10-03 LAB — CBC/D/PLT+RPR+RH+ABO+RUBIGG...
Antibody Screen: NEGATIVE
Basophils Absolute: 0 10*3/uL (ref 0.0–0.2)
Basos: 0 %
EOS (ABSOLUTE): 0.1 10*3/uL (ref 0.0–0.4)
Eos: 2 %
HCV Ab: NONREACTIVE
HIV Screen 4th Generation wRfx: NONREACTIVE
Hematocrit: 32.1 % — ABNORMAL LOW (ref 34.0–46.6)
Hemoglobin: 10.7 g/dL — ABNORMAL LOW (ref 11.1–15.9)
Hepatitis B Surface Ag: NEGATIVE
Immature Grans (Abs): 0.1 10*3/uL (ref 0.0–0.1)
Immature Granulocytes: 1 %
Lymphocytes Absolute: 1.7 10*3/uL (ref 0.7–3.1)
Lymphs: 20 %
MCH: 24.7 pg — ABNORMAL LOW (ref 26.6–33.0)
MCHC: 33.3 g/dL (ref 31.5–35.7)
MCV: 74 fL — ABNORMAL LOW (ref 79–97)
Monocytes Absolute: 0.7 10*3/uL (ref 0.1–0.9)
Monocytes: 8 %
Neutrophils Absolute: 5.8 10*3/uL (ref 1.4–7.0)
Neutrophils: 69 %
Platelets: 216 10*3/uL (ref 150–450)
RBC: 4.34 x10E6/uL (ref 3.77–5.28)
RDW: 17 % — ABNORMAL HIGH (ref 11.7–15.4)
RPR Ser Ql: REACTIVE — AB
Rh Factor: POSITIVE
Rubella Antibodies, IGG: 6.32 index (ref >0.99)
WBC: 8.4 10*3/uL (ref 3.4–10.8)

## 2021-10-03 LAB — HEMOGLOBIN A1C
Est. average glucose Bld gHb Est-mCnc: 120 mg/dL
Hgb A1c MFr Bld: 5.8 % — ABNORMAL HIGH (ref 4.8–5.6)

## 2021-10-03 LAB — TSH: TSH: 4.94 u[IU]/mL — ABNORMAL HIGH (ref 0.450–4.500)

## 2021-10-03 LAB — GC/CHLAMYDIA PROBE AMP (~~LOC~~) NOT AT ARMC
Chlamydia: NEGATIVE
Comment: NEGATIVE
Comment: NORMAL
Neisseria Gonorrhea: NEGATIVE

## 2021-10-03 LAB — RPR, QUANT+TP ABS (REFLEX)
Rapid Plasma Reagin, Quant: 1:2 {titer} — ABNORMAL HIGH
T Pallidum Abs: NONREACTIVE

## 2021-10-03 LAB — HCV INTERPRETATION

## 2021-10-04 ENCOUNTER — Encounter: Payer: Self-pay | Admitting: Certified Nurse Midwife

## 2021-10-04 DIAGNOSIS — R8271 Bacteriuria: Secondary | ICD-10-CM

## 2021-10-04 DIAGNOSIS — E039 Hypothyroidism, unspecified: Secondary | ICD-10-CM

## 2021-10-04 MED ORDER — LEVOTHYROXINE SODIUM 125 MCG PO TABS: 125.0000 ug | ORAL_TABLET | Freq: Every day | ORAL | 5 refills | Status: DC

## 2021-10-04 NOTE — Addendum Note (Signed)
Addended by: Edd Arbour on: 10/04/2021 10:22 PM   Modules accepted: Orders

## 2021-10-05 LAB — URINE CULTURE, OB REFLEX

## 2021-10-05 LAB — CULTURE, OB URINE

## 2021-10-07 ENCOUNTER — Telehealth: Payer: Self-pay | Admitting: Family Medicine

## 2021-10-07 DIAGNOSIS — R8271 Bacteriuria: Secondary | ICD-10-CM | POA: Insufficient documentation

## 2021-10-07 MED ORDER — LEVOTHYROXINE SODIUM 125 MCG PO TABS: 125.0000 ug | ORAL_TABLET | Freq: Every day | ORAL | 5 refills | Status: DC

## 2021-10-07 NOTE — Telephone Encounter (Signed)
Called patient to schedule early 2 hour, there was no answer to the call and the option to leave a voicemail was unavailable.

## 2021-10-08 ENCOUNTER — Other Ambulatory Visit (INDEPENDENT_AMBULATORY_CARE_PROVIDER_SITE_OTHER): Payer: Self-pay | Admitting: Primary Care

## 2021-10-08 LAB — PANORAMA PRENATAL TEST FULL PANEL:PANORAMA TEST PLUS 5 ADDITIONAL MICRODELETIONS: FETAL FRACTION: 5.4

## 2021-10-08 NOTE — Telephone Encounter (Signed)
Routed to PCP 

## 2021-10-09 NOTE — Telephone Encounter (Signed)
Pt is calling back to follow up. Pt stated she is out of her medication.    Please advise.

## 2021-10-14 LAB — HORIZON CUSTOM: REPORT SUMMARY: POSITIVE — AB

## 2021-10-15 ENCOUNTER — Other Ambulatory Visit: Payer: Self-pay

## 2021-10-15 DIAGNOSIS — O0990 Supervision of high risk pregnancy, unspecified, unspecified trimester: Secondary | ICD-10-CM

## 2021-10-16 ENCOUNTER — Other Ambulatory Visit: Payer: Medicaid Other

## 2021-10-16 ENCOUNTER — Other Ambulatory Visit: Payer: Self-pay

## 2021-10-16 DIAGNOSIS — O0990 Supervision of high risk pregnancy, unspecified, unspecified trimester: Secondary | ICD-10-CM

## 2021-10-17 LAB — GLUCOSE TOLERANCE, 2 HOURS W/ 1HR
Glucose, 1 hour: 187 mg/dL — ABNORMAL HIGH (ref 70–179)
Glucose, 2 hour: 173 mg/dL — ABNORMAL HIGH (ref 70–152)
Glucose, Fasting: 84 mg/dL (ref 70–91)

## 2021-10-18 ENCOUNTER — Other Ambulatory Visit: Payer: Self-pay | Admitting: Certified Nurse Midwife

## 2021-10-18 ENCOUNTER — Encounter: Payer: Self-pay | Admitting: Certified Nurse Midwife

## 2021-10-18 DIAGNOSIS — O24912 Unspecified diabetes mellitus in pregnancy, second trimester: Secondary | ICD-10-CM

## 2021-10-18 MED ORDER — BLOOD GLUCOSE MONITOR KIT: PACK | 0 refills | Status: DC

## 2021-10-18 NOTE — Progress Notes (Signed)
Diabetes supplies sent to pharmacy, referral to nutritionist placed.  Edd Arbour, CNM, MSN, IBCLC Certified Nurse Midwife, [facility]

## 2021-10-21 ENCOUNTER — Other Ambulatory Visit: Payer: Self-pay | Admitting: *Deleted

## 2021-10-21 MED ORDER — ACCU-CHEK SOFTCLIX LANCETS MISC: 12 refills | Status: DC

## 2021-10-21 MED ORDER — ACCU-CHEK GUIDE W/DEVICE KIT: 1.0000 | PACK | Freq: Four times a day (QID) | 0 refills | Status: DC

## 2021-10-21 MED ORDER — ACCU-CHEK GUIDE VI STRP: ORAL_STRIP | 12 refills | Status: DC

## 2021-10-21 NOTE — BH Specialist Note (Unsigned)
Pt did not arrive to video visit and did not answer the phone; Unable to leave voice message as phone went to busy signal at both attempts to call; left MyChart message for patient.

## 2021-10-23 ENCOUNTER — Other Ambulatory Visit: Payer: Self-pay

## 2021-10-23 ENCOUNTER — Encounter (HOSPITAL_COMMUNITY): Payer: Self-pay | Admitting: Obstetrics & Gynecology

## 2021-10-23 ENCOUNTER — Inpatient Hospital Stay (HOSPITAL_COMMUNITY)
Admission: AD | Admit: 2021-10-23 | Discharge: 2021-10-23 | Disposition: A | Discharge status: Home / Self Care | Payer: Medicaid Other | Attending: Obstetrics & Gynecology | Admitting: Obstetrics & Gynecology

## 2021-10-23 DIAGNOSIS — O24312 Unspecified pre-existing diabetes mellitus in pregnancy, second trimester: Secondary | ICD-10-CM | POA: Insufficient documentation

## 2021-10-23 DIAGNOSIS — Z658 Other specified problems related to psychosocial circumstances: Secondary | ICD-10-CM

## 2021-10-23 DIAGNOSIS — O99212 Obesity complicating pregnancy, second trimester: Secondary | ICD-10-CM | POA: Diagnosis present

## 2021-10-23 DIAGNOSIS — O99891 Other specified diseases and conditions complicating pregnancy: Secondary | ICD-10-CM | POA: Insufficient documentation

## 2021-10-23 DIAGNOSIS — Z3A17 17 weeks gestation of pregnancy: Secondary | ICD-10-CM

## 2021-10-23 DIAGNOSIS — R8271 Bacteriuria: Secondary | ICD-10-CM

## 2021-10-23 DIAGNOSIS — U071 COVID-19: Secondary | ICD-10-CM

## 2021-10-23 DIAGNOSIS — R059 Cough, unspecified: Secondary | ICD-10-CM | POA: Insufficient documentation

## 2021-10-23 DIAGNOSIS — K219 Gastro-esophageal reflux disease without esophagitis: Secondary | ICD-10-CM | POA: Diagnosis not present

## 2021-10-23 DIAGNOSIS — O0990 Supervision of high risk pregnancy, unspecified, unspecified trimester: Secondary | ICD-10-CM

## 2021-10-23 LAB — COMPREHENSIVE METABOLIC PANEL
ALT: 40 U/L (ref 0–44)
AST: 27 U/L (ref 15–41)
Albumin: 2.9 g/dL — ABNORMAL LOW (ref 3.5–5.0)
Alkaline Phosphatase: 46 U/L (ref 38–126)
Anion gap: 7 (ref 5–15)
BUN: <5 mg/dL — ABNORMAL LOW (ref 6–20)
CO2: 24 mmol/L (ref 22–32)
Calcium: 8.9 mg/dL (ref 8.9–10.3)
Chloride: 108 mmol/L (ref 98–111)
Creatinine, Ser: 0.62 mg/dL (ref 0.44–1.00)
GFR, Estimated: >60 mL/min (ref >60)
Glucose, Bld: 84 mg/dL (ref 70–99)
Potassium: 3.7 mmol/L (ref 3.5–5.1)
Sodium: 139 mmol/L (ref 135–145)
Total Bilirubin: 0.3 mg/dL (ref 0.3–1.2)
Total Protein: 6.2 g/dL — ABNORMAL LOW (ref 6.5–8.1)

## 2021-10-23 LAB — CBC WITH DIFFERENTIAL/PLATELET
Abs Immature Granulocytes: 0.07 10*3/uL (ref 0.00–0.07)
Basophils Absolute: 0 10*3/uL (ref 0.0–0.1)
Basophils Relative: 0 %
Eosinophils Absolute: 0.1 10*3/uL (ref 0.0–0.5)
Eosinophils Relative: 2 %
HCT: 33.5 % — ABNORMAL LOW (ref 36.0–46.0)
Hemoglobin: 11.1 g/dL — ABNORMAL LOW (ref 12.0–15.0)
Immature Granulocytes: 1 %
Lymphocytes Relative: 21 %
Lymphs Abs: 1.3 10*3/uL (ref 0.7–4.0)
MCH: 25.3 pg — ABNORMAL LOW (ref 26.0–34.0)
MCHC: 33.1 g/dL (ref 30.0–36.0)
MCV: 76.5 fL — ABNORMAL LOW (ref 80.0–100.0)
Monocytes Absolute: 0.6 10*3/uL (ref 0.1–1.0)
Monocytes Relative: 9 %
Neutro Abs: 4.2 10*3/uL (ref 1.7–7.7)
Neutrophils Relative %: 67 %
Platelets: 216 10*3/uL (ref 150–400)
RBC: 4.38 MIL/uL (ref 3.87–5.11)
RDW: 17.2 % — ABNORMAL HIGH (ref 11.5–15.5)
WBC: 6.2 10*3/uL (ref 4.0–10.5)
nRBC: 0 % (ref 0.0–0.2)

## 2021-10-23 LAB — URINALYSIS, ROUTINE W REFLEX MICROSCOPIC
Bilirubin Urine: NEGATIVE
Glucose, UA: NEGATIVE mg/dL
Hgb urine dipstick: NEGATIVE
Ketones, ur: NEGATIVE mg/dL
Nitrite: NEGATIVE
Protein, ur: 30 mg/dL — AB
Specific Gravity, Urine: 1.021 (ref 1.005–1.030)
pH: 5 (ref 5.0–8.0)

## 2021-10-23 LAB — RESP PANEL BY RT-PCR (FLU A&B, COVID) ARPGX2
Influenza A by PCR: NEGATIVE
Influenza B by PCR: NEGATIVE
SARS Coronavirus 2 by RT PCR: POSITIVE — AB

## 2021-10-23 LAB — LIPASE, BLOOD: Lipase: 30 U/L (ref 11–51)

## 2021-10-23 MED ORDER — DICYCLOMINE HCL 10 MG/5ML PO SOLN
10.0000 mg | Freq: Once | ORAL | Status: AC
  Administered 2021-10-23: 10 mg via ORAL
  Filled 2021-10-23: qty 5

## 2021-10-23 MED ORDER — FAMOTIDINE 20 MG PO TABS
20.0000 mg | ORAL_TABLET | Freq: Every day | ORAL | Status: AC
  Administered 2021-10-23: 20 mg via ORAL
  Filled 2021-10-23: qty 1

## 2021-10-23 MED ORDER — FAMOTIDINE 20 MG PO TABS: 20.0000 mg | ORAL_TABLET | Freq: Every day | ORAL | 1 refills | Status: DC

## 2021-10-23 MED ORDER — ALUM & MAG HYDROXIDE-SIMETH 200-200-20 MG/5ML PO SUSP
30.0000 mL | Freq: Once | ORAL | Status: AC
  Administered 2021-10-23: 30 mL via ORAL
  Filled 2021-10-23: qty 30

## 2021-10-23 NOTE — MAU Note (Signed)
Alyssa Hartman is a 26 y.o. at [redacted]w[redacted]d here in MAU reporting: intermittent sharp mid to upper abdominal pain that began this morning.  Reports pain comes in waves. Denies VB. LMP: N/A Onset of complaint: today Pain score: 0 now but when pain comes 10 Vitals:   10/23/21 0833  BP: 111/62  Pulse: (!) 105  Resp: 18  Temp: 97.9 F (36.6 C)  SpO2: 97%     FHT:143 bpm Lab orders placed from triage:  UA

## 2021-10-23 NOTE — Progress Notes (Signed)
Upon assessment patient complained of runny nose cough, chills with sweating. Provider made aware.  Precautions followed

## 2021-10-23 NOTE — MAU Provider Note (Addendum)
History     CSN: [id number]  Arrival date and time: 10/23/21 0817   None     No chief complaint on file.  HPI  Patient presenting for intermittent epigastric pain since early this morning that comes and goes but is not associated with eating, drinking, nausea, emesis or activity. Denies SOB, chest pain, RUQ pain, constipation/diarrhea. Patient endorses history of GERD since adolescence often responsive to peppermint.   Patient also endorses nonproductive cough x1 week with associated myalgias, chills, nasal congestion, 2x loose stools. Negative home COVID test last week. No sick contacts. No fevers.   +FM No VB, LOF, contractions.  OB History     Gravida  4   Para  3   Term  3   Preterm  0   AB      Living  3      SAB      IAB      Ectopic      Multiple  0   Live Births  3           Past Medical History:  Diagnosis Date   Abscess of right axilla    Anxiety    BV (bacterial vaginosis)    Carpal tunnel syndrome    Depression    GBS (group B Streptococcus carrier), +RV culture, currently pregnant 03/16/2018   GDM (gestational diabetes mellitus)    Gestational diabetes 02/03/2018   Current Diabetic Medications:  None   Required Referrals for A1GDM or A2GDM: [ ]  Diabetes Education and Testing Supplies [ ]  Nutrition Cousult   Baseline and surveillance labs (pulled in from [facility], refresh links as needed)  Lab Results  Component Value Date   CREATININE 0.96 05/08/2017   AST 32 05/08/2017   ALT 29 05/08/2017   TSH 3.830 11/10/2017   Lab Results  Component Value Date   HGBA1C 5.7 (   HSV-1 (herpes simplex virus 1) infection    Hx of Hashimoto thyroiditis    Hypothyroid     Past Surgical History:  Procedure Laterality Date   NO PAST SURGERIES      Family History  Problem Relation Age of Onset   Asthma Sister    Thyroid disease Sister    Diabetes Maternal Grandmother    Hypertension Maternal Grandmother     Social History   Tobacco Use   Smoking  status: Former    Types: Cigarettes    Quit date: 03/18/2013    Years since quitting: 8.6   Smokeless tobacco: Never  Vaping Use   Vaping Use: Never used  Substance Use Topics   Alcohol use: Not Currently   Drug use: Not Currently    Types: Marijuana    Comment: last used January 2023    Allergies: No Known Allergies  Medications Prior to Admission  Medication Sig Dispense Refill Last Dose   Accu-Chek Softclix Lancets lancets Use 4 times daily as instructed 100 each 12    acyclovir (ZOVIRAX) 400 MG tablet Take 1 tablet by mouth twice daily 60 tablet 0    Blood Glucose Monitoring Suppl (ACCU-CHEK GUIDE) w/Device KIT 1 Device by Does not apply route 4 (four) times daily. 1 kit 0    Blood Pressure Monitoring (BLOOD PRESSURE KIT) DEVI 1 Device by Does not apply route as needed. 1 each 0    glucose blood (ACCU-CHEK GUIDE) test strip Use 4 times daily as instructed 100 each 12    levothyroxine (SYNTHROID) 125 MCG tablet Take 1 tablet (125 mcg  total) by mouth daily before breakfast. 30 tablet 5    Misc. Devices (GOJJI WEIGHT SCALE) MISC 1 Device by Does not apply route as needed. 1 each 0    Prenatal Vit-Fe Fumarate-FA (PRENATAL VITAMIN) 27-0.8 MG TABS Take 1 tablet by mouth daily. 30 tablet 11    Prenatal Vit-Fe Fumarate-FA (PRENATAL VITAMINS PO) Take by mouth.       Review of Systems  Constitutional:  Positive for chills and diaphoresis. Negative for activity change, appetite change, fatigue and fever.  HENT:  Positive for congestion and rhinorrhea. Negative for sinus pressure, sinus pain, sneezing and sore throat.   Eyes:  Negative for visual disturbance.  Respiratory:  Positive for cough. Negative for chest tightness, shortness of breath and wheezing.   Cardiovascular:  Negative for chest pain, palpitations and leg swelling.  Gastrointestinal:  Positive for abdominal pain. Negative for abdominal distention, constipation, diarrhea, nausea and vomiting.  Endocrine: Negative for  polyuria.  Genitourinary:  Negative for difficulty urinating, dysuria, flank pain, frequency, pelvic pain, vaginal bleeding and vaginal discharge.  Skin:  Negative for rash.  Neurological: Negative.   Psychiatric/Behavioral: Negative.    FHT 143   Results for orders placed or performed during the hospital encounter of 10/23/21 (from the past 24 hour(s))  Urinalysis, Routine w reflex microscopic Urine, Clean Catch     Status: Abnormal   Collection Time: 10/23/21  9:11 AM  Result Value Ref Range   Color, Urine AMBER (A) YELLOW   APPearance CLOUDY (A) CLEAR   Specific Gravity, Urine 1.021 1.005 - 1.030   pH 5.0 5.0 - 8.0   Glucose, UA NEGATIVE NEGATIVE mg/dL   Hgb urine dipstick NEGATIVE NEGATIVE   Bilirubin Urine NEGATIVE NEGATIVE   Ketones, ur NEGATIVE NEGATIVE mg/dL   Protein, ur 30 (A) NEGATIVE mg/dL   Nitrite NEGATIVE NEGATIVE   Leukocytes,Ua LARGE (A) NEGATIVE   RBC / HPF 0-5 0 - 5 RBC/hpf   WBC, UA 11-20 0 - 5 WBC/hpf   Bacteria, UA RARE (A) NONE SEEN   Squamous Epithelial / LPF 11-20 0 - 5   Mucus PRESENT   Resp Panel by RT-PCR (Flu A&B, Covid) Anterior Nasal Swab     Status: Abnormal   Collection Time: 10/23/21  9:15 AM   Specimen: Anterior Nasal Swab  Result Value Ref Range   SARS Coronavirus 2 by RT PCR POSITIVE (A) NEGATIVE   Influenza A by PCR NEGATIVE NEGATIVE   Influenza B by PCR NEGATIVE NEGATIVE  CBC with Differential/Platelet     Status: Abnormal   Collection Time: 10/23/21 11:34 AM  Result Value Ref Range   WBC 6.2 4.0 - 10.5 K/uL   RBC 4.38 3.87 - 5.11 MIL/uL   Hemoglobin 11.1 (L) 12.0 - 15.0 g/dL   HCT 33.5 (L) 36.0 - 46.0 %   MCV 76.5 (L) 80.0 - 100.0 fL   MCH 25.3 (L) 26.0 - 34.0 pg   MCHC 33.1 30.0 - 36.0 g/dL   RDW 17.2 (H) 11.5 - 15.5 %   Platelets 216 150 - 400 K/uL   nRBC 0.0 0.0 - 0.2 %   Neutrophils Relative % 67 %   Neutro Abs 4.2 1.7 - 7.7 K/uL   Lymphocytes Relative 21 %   Lymphs Abs 1.3 0.7 - 4.0 K/uL   Monocytes Relative 9 %    Monocytes Absolute 0.6 0.1 - 1.0 K/uL   Eosinophils Relative 2 %   Eosinophils Absolute 0.1 0.0 - 0.5 K/uL   Basophils Relative 0 %  Basophils Absolute 0.0 0.0 - 0.1 K/uL   Immature Granulocytes 1 %   Abs Immature Granulocytes 0.07 0.00 - 0.07 K/uL  Comprehensive metabolic panel     Status: Abnormal   Collection Time: 10/23/21 11:34 AM  Result Value Ref Range   Sodium 139 135 - 145 mmol/L   Potassium 3.7 3.5 - 5.1 mmol/L   Chloride 108 98 - 111 mmol/L   CO2 24 22 - 32 mmol/L   Glucose, Bld 84 70 - 99 mg/dL   BUN <5 (L) 6 - 20 mg/dL   Creatinine, Ser 0.62 0.44 - 1.00 mg/dL   Calcium 8.9 8.9 - 10.3 mg/dL   Total Protein 6.2 (L) 6.5 - 8.1 g/dL   Albumin 2.9 (L) 3.5 - 5.0 g/dL   AST 27 15 - 41 U/L   ALT 40 0 - 44 U/L   Alkaline Phosphatase 46 38 - 126 U/L   Total Bilirubin 0.3 0.3 - 1.2 mg/dL   GFR, Estimated >60 >60 mL/min   Anion gap 7 5 - 15  Lipase, blood     Status: None   Collection Time: 10/23/21 11:34 AM  Result Value Ref Range   Lipase 30 11 - 51 U/L    Physical Exam   Blood pressure 111/62, pulse (!) 105, temperature 97.9 F (36.6 C), temperature source Oral, resp. rate 18, height 5' 2"  (1.575 m), weight 118.5 kg, last menstrual period 06/24/2021, SpO2 97 %.  Physical Exam Constitutional:      Appearance: Normal appearance. She is obese.  HENT:     Head: Normocephalic and atraumatic.     Nose: Congestion and rhinorrhea present.     Mouth/Throat:     Mouth: Mucous membranes are moist.  Eyes:     Extraocular Movements: Extraocular movements intact.  Cardiovascular:     Rate and Rhythm: Normal rate and regular rhythm.  Pulmonary:     Effort: Pulmonary effort is normal. No respiratory distress.     Breath sounds: Normal breath sounds. No wheezing, rhonchi or rales.  Abdominal:     General: Bowel sounds are normal. There is no distension.     Palpations: Abdomen is soft.     Tenderness: There is no abdominal tenderness. There is no right CVA tenderness or  guarding.  Musculoskeletal:        General: No swelling. Normal range of motion.     Cervical back: Normal range of motion.  Skin:    General: Skin is warm and dry.     Capillary Refill: Capillary refill takes less than 2 seconds.     Findings: No rash.  Neurological:     Mental Status: She is alert. Mental status is at baseline.  Psychiatric:        Mood and Affect: Mood normal.     MAU Course  Procedures  Suspect GERD vs gastritis complicated by viral URI - respiratory swab collected - UA with culture - CBC, CMP, lipase collected - GI cocktail, with transient relief of epigastric pain - Pepcid, provided minimal symptom improvement  MDM   Assessment and Plan  Obese female with acute intermittent onset epigastric pain since waking this morning, and history of GERD. Complicated by 1 week of URI symptoms.   COVID positive - supportive care discussed; tylenol for pain, cough drops, hydration, rest. Precautions handout provided. GERD - Pepcid prescription sent to preferred pharmacy  Discharge home with supportive care.   Janice Norrie MD PGY3 10/23/2021, 8:41 AM    I supervised  the resident during this patient encounter. Please see my provider note for complete documentation.

## 2021-10-23 NOTE — MAU Provider Note (Cosign Needed Addendum)
History     CSN: [id number]  Arrival date and time: 10/23/21 7829   Event Date/Time   First Provider Initiated Contact with Patient 10/23/21 [phone]      Chief Complaint  Patient presents with   Abdominal Pain   26 y.o. G4P3003 _0 .2 wks presenting with epigastric pain. Pain started this am and is intermittent. Pain not associated with eating, drinking, nausea, emesis, or activity. Endorses nonproductive cough x1 week with associated myalgias, chills, nasal congestion and loose stools. Had negative home Covid test last week. No sick contacts or fevers. Reports +FM. No VB, LOF, or ctx.    OB History     Gravida  4   Para  3   Term  3   Preterm  0   AB      Living  3      SAB      IAB      Ectopic      Multiple  0   Live Births  3           Past Medical History:  Diagnosis Date   Abscess of right axilla    Anxiety    BV (bacterial vaginosis)    Carpal tunnel syndrome    Depression    GBS (group B Streptococcus carrier), +RV culture, currently pregnant 03/16/2018   GDM (gestational diabetes mellitus)    Gestational diabetes 02/03/2018   Current Diabetic Medications:  None   Required Referrals for A1GDM or A2GDM: _1  Diabetes Education and Testing Supplies _2  Nutrition Cousult   Baseline and surveillance labs (pulled in from [facility], refresh links as needed)  Lab Results  Component Value Date   CREATININE 0.96 05/08/2017   AST 32 05/08/2017   ALT 29 05/08/2017   TSH 3.830 11/10/2017   Lab Results  Component Value Date   HGBA1C 5.7 (   HSV-1 (herpes simplex virus 1) infection    Hx of Hashimoto thyroiditis    Hypothyroid     Past Surgical History:  Procedure Laterality Date   NO PAST SURGERIES      Family History  Problem Relation Age of Onset   Asthma Sister    Thyroid disease Sister    Diabetes Maternal Grandmother    Hypertension Maternal Grandmother     Social History   Tobacco Use   Smoking status: Former    Types: Cigarettes    Quit date:  03/18/2013    Years since quitting: 8.6   Smokeless tobacco: Never  Vaping Use   Vaping Use: Never used  Substance Use Topics   Alcohol use: Not Currently   Drug use: Not Currently    Types: Marijuana    Comment: last used January 2023    Allergies:  Allergies  Allergen Reactions   Milk-Related Compounds     No medications prior to admission.    Review of Systems  Constitutional:  Positive for chills. Negative for fever.  HENT:  Positive for congestion.   Respiratory:  Positive for cough.   Gastrointestinal:  Positive for abdominal pain.  Genitourinary:  Negative for vaginal bleeding.   Physical Exam   Blood pressure 99/62, pulse 95, temperature 98.2 F (36.8 C), temperature source Oral, resp. rate 16, height _3  (1.575 m), weight 118.5 kg, last menstrual period 06/24/2021, SpO2 97 %.  Physical Exam Vitals and nursing note reviewed.  Constitutional:      General: She is not in acute distress.    Appearance: Normal appearance.  HENT:  Head: Normocephalic and atraumatic.  Cardiovascular:     Rate and Rhythm: Normal rate.  Pulmonary:     Effort: Pulmonary effort is normal. No respiratory distress.  Abdominal:     General: There is no distension.     Palpations: Abdomen is soft. There is no mass.     Tenderness: There is no abdominal tenderness. There is no guarding or rebound.     Hernia: No hernia is present.  Musculoskeletal:        General: Normal range of motion.     Cervical back: Normal range of motion.  Neurological:     General: No focal deficit present.     Mental Status: She is alert and oriented to person, place, and time.  Psychiatric:        Mood and Affect: Mood normal.        Behavior: Behavior normal.   FHT 143  Results for orders placed or performed during the hospital encounter of 10/23/21 (from the past 24 hour(s))  Urinalysis, Routine w reflex microscopic Urine, Clean Catch     Status: Abnormal   Collection Time: 10/23/21  9:11 AM   Result Value Ref Range   Color, Urine AMBER (A) YELLOW   APPearance CLOUDY (A) CLEAR   Specific Gravity, Urine 1.021 1.005 - 1.030   pH 5.0 5.0 - 8.0   Glucose, UA NEGATIVE NEGATIVE mg/dL   Hgb urine dipstick NEGATIVE NEGATIVE   Bilirubin Urine NEGATIVE NEGATIVE   Ketones, ur NEGATIVE NEGATIVE mg/dL   Protein, ur 30 (A) NEGATIVE mg/dL   Nitrite NEGATIVE NEGATIVE   Leukocytes,Ua LARGE (A) NEGATIVE   RBC / HPF 0-5 0 - 5 RBC/hpf   WBC, UA 11-20 0 - 5 WBC/hpf   Bacteria, UA RARE (A) NONE SEEN   Squamous Epithelial / LPF 11-20 0 - 5   Mucus PRESENT   Resp Panel by RT-PCR (Flu A&B, Covid) Anterior Nasal Swab     Status: Abnormal   Collection Time: 10/23/21  9:15 AM   Specimen: Anterior Nasal Swab  Result Value Ref Range   SARS Coronavirus 2 by RT PCR POSITIVE (A) NEGATIVE   Influenza A by PCR NEGATIVE NEGATIVE   Influenza B by PCR NEGATIVE NEGATIVE  CBC with Differential/Platelet     Status: Abnormal   Collection Time: 10/23/21 11:34 AM  Result Value Ref Range   WBC 6.2 4.0 - 10.5 K/uL   RBC 4.38 3.87 - 5.11 MIL/uL   Hemoglobin 11.1 (L) 12.0 - 15.0 g/dL   HCT 33.5 (L) 36.0 - 46.0 %   MCV 76.5 (L) 80.0 - 100.0 fL   MCH 25.3 (L) 26.0 - 34.0 pg   MCHC 33.1 30.0 - 36.0 g/dL   RDW 17.2 (H) 11.5 - 15.5 %   Platelets 216 150 - 400 K/uL   nRBC 0.0 0.0 - 0.2 %   Neutrophils Relative % 67 %   Neutro Abs 4.2 1.7 - 7.7 K/uL   Lymphocytes Relative 21 %   Lymphs Abs 1.3 0.7 - 4.0 K/uL   Monocytes Relative 9 %   Monocytes Absolute 0.6 0.1 - 1.0 K/uL   Eosinophils Relative 2 %   Eosinophils Absolute 0.1 0.0 - 0.5 K/uL   Basophils Relative 0 %   Basophils Absolute 0.0 0.0 - 0.1 K/uL   Immature Granulocytes 1 %   Abs Immature Granulocytes 0.07 0.00 - 0.07 K/uL  Comprehensive metabolic panel     Status: Abnormal   Collection Time: 10/23/21 11:34 AM  Result Value Ref Range   Sodium 139 135 - 145 mmol/L   Potassium 3.7 3.5 - 5.1 mmol/L   Chloride 108 98 - 111 mmol/L   CO2 24 22 - 32  mmol/L   Glucose, Bld 84 70 - 99 mg/dL   BUN <5 (L) 6 - 20 mg/dL   Creatinine, Ser 0.62 0.44 - 1.00 mg/dL   Calcium 8.9 8.9 - 10.3 mg/dL   Total Protein 6.2 (L) 6.5 - 8.1 g/dL   Albumin 2.9 (L) 3.5 - 5.0 g/dL   AST 27 15 - 41 U/L   ALT 40 0 - 44 U/L   Alkaline Phosphatase 46 38 - 126 U/L   Total Bilirubin 0.3 0.3 - 1.2 mg/dL   GFR, Estimated >60 >60 mL/min   Anion gap 7 5 - 15  Lipase, blood     Status: None   Collection Time: 10/23/21 11:34 AM  Result Value Ref Range   Lipase 30 11 - 51 U/L   MAU Course  Procedures GI cocktail Pepcid  MDM Labs ordered and reviewed. UA with large leuks, likely contaminated, culture sent. Had initial improvement after meds but pain returned. Covid likely to cause sx, discussed supportive measures and quarantine. Stable for discharge home.   Assessment and Plan  [redacted] weeks gestation Covid infection GERD  Discharge home [facility] as scheduled Rx Pepcid Return precautions  Allergies as of 10/23/2021       Reactions   Milk-related Compounds         Medication List     TAKE these medications    Accu-Chek Guide test strip Generic drug: glucose blood Use 4 times daily as instructed   Accu-Chek Guide w/Device Kit 1 Device by Does not apply route 4 (four) times daily.   Accu-Chek Softclix Lancets lancets Use 4 times daily as instructed   acyclovir 400 MG tablet Commonly known as: ZOVIRAX Take 1 tablet by mouth twice daily   Blood Pressure Kit Devi 1 Device by Does not apply route as needed.   famotidine 20 MG tablet Commonly known as: PEPCID Take 1 tablet (20 mg total) by mouth at bedtime.   Gojji Weight Scale Misc 1 Device by Does not apply route as needed.   levothyroxine 125 MCG tablet Commonly known as: Synthroid Take 1 tablet (125 mcg total) by mouth daily before breakfast.   Prenatal Vitamin 27-0.8 MG Tabs Take 1 tablet by mouth daily. What changed: Another medication with the same name was removed.  Continue taking this medication, and follow the directions you see here.        Julianne Handler, CNM 10/23/2021, 2:16 PM

## 2021-10-23 NOTE — Progress Notes (Signed)
11am Patient stated that her abd pain has subsided. She was able to take a nap.  1129am  Pt stated that her pain has returned. Provider made aware.

## 2021-10-23 NOTE — Discharge Instructions (Signed)
Person Under Monitoring Name: Alyssa Hartman  Location: [address] [place] [address]   Infection Prevention Recommendations for Individuals Confirmed to have, or Being Evaluated for, 2019 Novel Coronavirus (COVID-19) Infection Who Receive Care at Home  Individuals who are confirmed to have, or are being evaluated for, COVID-19 should follow the prevention steps below until a healthcare provider or local or state health department says they can return to normal activities.  Stay home except to get medical care You should restrict activities outside your home, except for getting medical care. Do not go to work, school, or public areas, and do not use public transportation or taxis.  Call ahead before visiting your doctor Before your medical appointment, call the healthcare provider and tell them that you have, or are being evaluated for, COVID-19 infection. This will help the healthcare provider's office take steps to keep other people from getting infected. Ask your healthcare provider to call the local or state health department.  Monitor your symptoms Seek prompt medical attention if your illness is worsening (e.g., difficulty breathing). Before going to your medical appointment, call the healthcare provider and tell them that you have, or are being evaluated for, COVID-19 infection. Ask your healthcare provider to call the local or state health department.  Wear a facemask You should wear a facemask that covers your nose and mouth when you are in the same room with other people and when you visit a healthcare provider. People who live with or visit you should also wear a facemask while they are in the same room with you.  Separate yourself from other people in your home As much as possible, you should stay in a different room from other people in your home. Also, you should use a separate bathroom, if available.  Avoid sharing household items You should not share  dishes, drinking glasses, cups, eating utensils, towels, bedding, or other items with other people in your home. After using these items, you should wash them thoroughly with soap and water.  Cover your coughs and sneezes Cover your mouth and nose with a tissue when you cough or sneeze, or you can cough or sneeze into your sleeve. Throw used tissues in a lined trash can, and immediately wash your hands with soap and water for at least 20 seconds or use an alcohol-based hand rub.  Wash your [employer] your hands often and thoroughly with soap and water for at least 20 seconds. You can use an alcohol-based hand sanitizer if soap and water are not available and if your hands are not visibly dirty. Avoid touching your eyes, nose, and mouth with unwashed hands.   Prevention Steps for Caregivers and Household Members of Individuals Confirmed to have, or Being Evaluated for, COVID-19 Infection Being Cared for in the Home  If you live with, or provide care at home for, a person confirmed to have, or being evaluated for, COVID-19 infection please follow these guidelines to prevent infection:  Follow healthcare provider's instructions Make sure that you understand and can help the patient follow any healthcare provider instructions for all care.  Provide for the patient's basic needs You should help the patient with basic needs in the home and provide support for getting groceries, prescriptions, and other personal needs.  Monitor the patient's symptoms If they are getting sicker, call his or her medical provider and tell them that the patient has, or is being evaluated for, COVID-19 infection. This will help the healthcare provider's office  take steps to keep other people from getting infected. Ask the healthcare provider to call the local or state health department.  Limit the number of people who have contact with the patient If possible, have only one caregiver for the patient. Other  household members should stay in another home or place of residence. If this is not possible, they should stay in another room, or be separated from the patient as much as possible. Use a separate bathroom, if available. Restrict visitors who do not have an essential need to be in the home.  Keep older adults, very young children, and other sick people away from the patient Keep older adults, very young children, and those who have compromised immune systems or chronic health conditions away from the patient. This includes people with chronic heart, lung, or kidney conditions, diabetes, and cancer.  Ensure good ventilation Make sure that shared spaces in the home have good air flow, such as from an air conditioner or an opened window, weather permitting.  Wash your hands often Wash your hands often and thoroughly with soap and water for at least 20 seconds. You can use an alcohol based hand sanitizer if soap and water are not available and if your hands are not visibly dirty. Avoid touching your eyes, nose, and mouth with unwashed hands. Use disposable paper towels to dry your hands. If not available, use dedicated cloth towels and replace them when they become wet.  Wear a facemask and gloves Wear a disposable facemask at all times in the room and gloves when you touch or have contact with the patient's blood, body fluids, and/or secretions or excretions, such as sweat, saliva, sputum, nasal mucus, vomit, urine, or feces.  Ensure the mask fits over your nose and mouth tightly, and do not touch it during use. Throw out disposable facemasks and gloves after using them. Do not reuse. Wash your hands immediately after removing your facemask and gloves. If your personal clothing becomes contaminated, carefully remove clothing and launder. Wash your hands after handling contaminated clothing. Place all used disposable facemasks, gloves, and other waste in a lined container before disposing them with  other household waste. Remove gloves and wash your hands immediately after handling these items.  Do not share dishes, glasses, or other household items with the patient Avoid sharing household items. You should not share dishes, drinking glasses, cups, eating utensils, towels, bedding, or other items with a patient who is confirmed to have, or being evaluated for, COVID-19 infection. After the person uses these items, you should wash them thoroughly with soap and water.  Wash laundry thoroughly Immediately remove and wash clothes or bedding that have blood, body fluids, and/or secretions or excretions, such as sweat, saliva, sputum, nasal mucus, vomit, urine, or feces, on them. Wear gloves when handling laundry from the patient. Read and follow directions on labels of laundry or clothing items and detergent. In general, wash and dry with the warmest temperatures recommended on the label.  Clean all areas the individual has used often Clean all touchable surfaces, such as counters, tabletops, doorknobs, bathroom fixtures, toilets, phones, keyboards, tablets, and bedside tables, every day. Also, clean any surfaces that may have blood, body fluids, and/or secretions or excretions on them. Wear gloves when cleaning surfaces the patient has come in contact with. Use a diluted bleach solution (e.g., dilute bleach with 1 part bleach and 10 parts water) or a household disinfectant with a label that says EPA-registered for coronaviruses. To make a bleach  solution at home, add 1 tablespoon of bleach to 1 quart (4 cups) of water. For a larger supply, add  cup of bleach to 1 gallon (16 cups) of water. Read labels of cleaning products and follow recommendations provided on product labels. Labels contain instructions for safe and effective use of the cleaning product including precautions you should take when applying the product, such as wearing gloves or eye protection and making sure you have good ventilation  during use of the product. Remove gloves and wash hands immediately after cleaning.  Monitor yourself for signs and symptoms of illness Caregivers and household members are considered close contacts, should monitor their health, and will be asked to limit movement outside of the home to the extent possible. Follow the monitoring steps for close contacts listed on the symptom monitoring form.   ? If you have additional questions, contact your local health department or call the epidemiologist on call at [phone] (available 24/7). ? This guidance is subject to change. For the most up-to-date guidance from [facility], please refer to their website: [email or web address]

## 2021-10-24 ENCOUNTER — Ambulatory Visit: Payer: Medicaid Other | Admitting: Clinical

## 2021-10-24 DIAGNOSIS — Z91199 Patient's noncompliance with other medical treatment and regimen due to unspecified reason: Secondary | ICD-10-CM

## 2021-10-24 LAB — CULTURE, OB URINE

## 2021-10-31 ENCOUNTER — Encounter: Payer: Self-pay | Admitting: Obstetrics and Gynecology

## 2021-10-31 ENCOUNTER — Other Ambulatory Visit: Payer: Self-pay

## 2021-10-31 ENCOUNTER — Ambulatory Visit (INDEPENDENT_AMBULATORY_CARE_PROVIDER_SITE_OTHER): Payer: Medicaid Other | Admitting: Obstetrics and Gynecology

## 2021-10-31 VITALS — BP 131/60 | HR 111 | Wt 156.3 lb

## 2021-10-31 DIAGNOSIS — Z87898 Personal history of other specified conditions: Secondary | ICD-10-CM

## 2021-10-31 DIAGNOSIS — O24312 Unspecified pre-existing diabetes mellitus in pregnancy, second trimester: Secondary | ICD-10-CM

## 2021-10-31 DIAGNOSIS — R768 Other specified abnormal immunological findings in serum: Secondary | ICD-10-CM

## 2021-10-31 DIAGNOSIS — O0990 Supervision of high risk pregnancy, unspecified, unspecified trimester: Secondary | ICD-10-CM

## 2021-10-31 DIAGNOSIS — E063 Autoimmune thyroiditis: Secondary | ICD-10-CM

## 2021-10-31 DIAGNOSIS — R8271 Bacteriuria: Secondary | ICD-10-CM

## 2021-10-31 MED ORDER — ACCU-CHEK GUIDE VI STRP: ORAL_STRIP | 12 refills | Status: DC

## 2021-10-31 MED ORDER — ACCU-CHEK GUIDE W/DEVICE KIT: 1.0000 | PACK | Freq: Four times a day (QID) | 0 refills | Status: DC

## 2021-10-31 MED ORDER — ACCU-CHEK SOFTCLIX LANCETS MISC: 12 refills | Status: DC

## 2021-10-31 NOTE — Patient Instructions (Signed)

## 2021-10-31 NOTE — Progress Notes (Signed)
Subjective:  ALEIGH GRUNDEN is a 26 y.o. [phone] at [redacted]w[redacted]d being seen today for ongoing prenatal care.  She is currently monitored for the following issues for this high-risk pregnancy and has Bipolar 1 disorder (HCC); Hashimoto's thyroiditis; Obesity; Biological false positive RPR test; History of genetic disease; Supervision of high risk pregnancy, antepartum; Psychosocial stressors; GBS bacteriuria; and Preexisting diabetes complicating pregnancy in second trimester, antepartum on their problem list.  Patient reports recent Covid, Still with some cough.  Contractions: Not present. Vag. Bleeding: None.  Movement: Absent. Denies leaking of fluid.   The following portions of the patient's history were reviewed and updated as appropriate: allergies, current medications, past family history, past medical history, past social history, past surgical history and problem list. Problem list updated.  Objective:   Vitals:   10/31/21 1555  BP: 131/60  Pulse: (!) 111  Weight: 156 lb 4.8 oz (70.9 kg)    Fetal Status: Fetal Heart Rate (bpm): 143   Movement: Absent     General:  Alert, oriented and cooperative. Patient is in no acute distress.  Skin: Skin is warm and dry. No rash noted.   Cardiovascular: Normal heart rate noted  Respiratory: Normal respiratory effort, no problems with respiration noted  Abdomen: Soft, gravid, appropriate for gestational age. Pain/Pressure: Present     Pelvic:  Cervical exam deferred        Extremities: Normal range of motion.  Edema: None  Mental Status: Normal mood and affect. Normal behavior. Normal judgment and thought content.   Urinalysis:      Assessment and Plan:  Pregnancy: G4P3003 at [redacted]w[redacted]d  1. Supervision of high risk pregnancy, antepartum Stable Anatomy scan scheduled  2. GBS bacteriuria Tx while in labor   3. History of genetic disease S/P genetic couseling, Different FOB with this pregnancy  4. Biological false positive RPR  test Stable  5. Hashimoto's thyroiditis Will check thyroid panel Continue with Synthroid   6. GDM  Has not picked up supplies yet Reviewed with pt Risk reduction with glycemic control Serial growth scans and antenatal testing as indicated Preterm labor symptoms and general obstetric precautions including but not limited to vaginal bleeding, contractions, leaking of fluid and fetal movement were reviewed in detail with the patient. Please refer to After Visit Summary for other counseling recommendations.  Return in about 4 weeks (around 11/28/2021) for OB visit, face to face, MD only.   Hermina Staggers, MD

## 2021-11-01 ENCOUNTER — Encounter (HOSPITAL_COMMUNITY): Payer: Self-pay | Admitting: Obstetrics & Gynecology

## 2021-11-01 ENCOUNTER — Inpatient Hospital Stay (HOSPITAL_COMMUNITY)
Admission: AD | Admit: 2021-11-01 | Discharge: 2021-11-01 | Disposition: A | Discharge status: Home / Self Care | Payer: Medicaid Other | Attending: Obstetrics & Gynecology | Admitting: Obstetrics & Gynecology

## 2021-11-01 DIAGNOSIS — Z3A18 18 weeks gestation of pregnancy: Secondary | ICD-10-CM | POA: Insufficient documentation

## 2021-11-01 DIAGNOSIS — O24419 Gestational diabetes mellitus in pregnancy, unspecified control: Secondary | ICD-10-CM | POA: Insufficient documentation

## 2021-11-01 DIAGNOSIS — O26892 Other specified pregnancy related conditions, second trimester: Secondary | ICD-10-CM | POA: Insufficient documentation

## 2021-11-01 DIAGNOSIS — O24410 Gestational diabetes mellitus in pregnancy, diet controlled: Secondary | ICD-10-CM

## 2021-11-01 DIAGNOSIS — O98512 Other viral diseases complicating pregnancy, second trimester: Secondary | ICD-10-CM | POA: Insufficient documentation

## 2021-11-01 DIAGNOSIS — R059 Cough, unspecified: Secondary | ICD-10-CM | POA: Diagnosis not present

## 2021-11-01 DIAGNOSIS — Z3492 Encounter for supervision of normal pregnancy, unspecified, second trimester: Secondary | ICD-10-CM

## 2021-11-01 LAB — THYROID PANEL WITH TSH
Free Thyroxine Index: 2.2 (ref 1.2–4.9)
T3 Uptake Ratio: 20 % — ABNORMAL LOW (ref 24–39)
T4, Total: 11.1 ug/dL (ref 4.5–12.0)
TSH: 0.347 u[IU]/mL — ABNORMAL LOW (ref 0.450–4.500)

## 2021-11-01 LAB — GLUCOSE, CAPILLARY: Glucose-Capillary: 99 mg/dL (ref 70–99)

## 2021-11-01 LAB — WET PREP, GENITAL
Clue Cells Wet Prep HPF POC: NONE SEEN
Sperm: NONE SEEN
Trich, Wet Prep: NONE SEEN
WBC, Wet Prep HPF POC: <10 (ref <10)
Yeast Wet Prep HPF POC: NONE SEEN

## 2021-11-01 LAB — AMNISURE RUPTURE OF MEMBRANE (ROM) NOT AT ARMC: Amnisure ROM: NEGATIVE

## 2021-11-01 LAB — POCT FERN TEST: POCT Fern Test: NEGATIVE

## 2021-11-01 MED ORDER — BENZONATATE 100 MG PO CAPS: 100.0000 mg | ORAL_CAPSULE | Freq: Three times a day (TID) | ORAL | 0 refills | Status: DC

## 2021-11-01 NOTE — MAU Note (Signed)
SAMAR VENNEMAN is a 26 y.o. at [redacted]w[redacted]d here in MAU reporting: last night was coughing so hard, her upper back was hurting, tight in upper chest, was hurting with cough. Coughing up some mucous and brown stuff.  When she was coughing some fluid came out from the vaginal area. Some came up when she was laying down later, was "kind of gooey". No fluid this morning, but last night was on her sheet and clothes.  Denies fever.  Denies pain this morning  Onset of complaint: 2100 Pain score: 0 Vitals:   11/01/21 0813 11/01/21 0815  BP:  118/61  Pulse:  100  Resp:  20  Temp:  98.7 F (37.1 C)  SpO2: 100% 100%     FHT:148 Lab orders placed from triage: fern  +COVID 8/30

## 2021-11-01 NOTE — MAU Provider Note (Signed)
History     CSN: [id number]  Arrival date and time: 11/01/21 0757   Event Date/Time   First Provider Initiated Contact with Patient 11/01/21 0932      Chief Complaint  Patient presents with   Rupture of Membranes   Cough   HPI Alyssa Hartman is a 26 y.o. G4P3003 at 27w4dwho presents to MAU with chief complaint of pPPROM. Patient reports new onset leaking of fluid during an episode of coughing last night. She did not feel a pop or a gush of fluid but states her bedding and pajamas were wet this morning.  Patient also reports recurrent dry cough in the setting of COVID diagnosis. She states she mentioned her cough during her prenatal appointment yesterday and was told she may have a low grade cough for an extended period of time. She denies aggravating or alleviating factors. She has not taken medication for this complaint.  She denies fever, SOB, weakness, syncope.   OB History     Gravida  4   Para  3   Term  3   Preterm  0   AB      Living  3      SAB      IAB      Ectopic      Multiple  0   Live Births  3           Past Medical History:  Diagnosis Date   Abscess of right axilla    Anxiety    BV (bacterial vaginosis)    Carpal tunnel syndrome    Depression    GBS (group B Streptococcus carrier), +RV culture, currently pregnant 03/16/2018   GDM (gestational diabetes mellitus)    Gestational diabetes 02/03/2018   Current Diabetic Medications:  None   Required Referrals for A1GDM or A2GDM: [ ] Diabetes Education and Testing Supplies [ ] Nutrition Cousult   Baseline and surveillance labs (pulled in from EPIC, refresh links as needed)  Lab Results  Component Value Date   CREATININE 0.96 05/08/2017   AST 32 05/08/2017   ALT 29 05/08/2017   TSH 3.830 11/10/2017   Lab Results  Component Value Date   HGBA1C 5.7 (   HSV-1 (herpes simplex virus 1) infection    Hx of Hashimoto thyroiditis    Hypothyroid     Past Surgical History:  Procedure Laterality  Date   NO PAST SURGERIES      Family History  Problem Relation Age of Onset   Asthma Sister    Thyroid disease Sister    Diabetes Maternal Grandmother    Hypertension Maternal Grandmother     Social History   Tobacco Use   Smoking status: Former    Types: Cigarettes    Quit date: 03/18/2013    Years since quitting: 8.6   Smokeless tobacco: Never  Vaping Use   Vaping Use: Never used  Substance Use Topics   Alcohol use: Not Currently   Drug use: Not Currently    Types: Marijuana    Comment: last used January 2023    Allergies:  Allergies  Allergen Reactions   Milk-Related Compounds     Medications Prior to Admission  Medication Sig Dispense Refill Last Dose   acyclovir (ZOVIRAX) 400 MG tablet Take 1 tablet by mouth twice daily 60 tablet 0 10/31/2021 at 2200   famotidine (PEPCID) 20 MG tablet Take 1 tablet (20 mg total) by mouth at bedtime. 30 tablet 1 10/31/2021 at 2200  levothyroxine (SYNTHROID) 125 MCG tablet Take 1 tablet (125 mcg total) by mouth daily before breakfast. 30 tablet 5 10/31/2021 at 0700   Prenatal Vit-Fe Fumarate-FA (PRENATAL VITAMIN) 27-0.8 MG TABS Take 1 tablet by mouth daily. 30 tablet 11 10/31/2021 at 0700   Accu-Chek Softclix Lancets lancets Use 4 times daily as instructed 100 each 12    Blood Glucose Monitoring Suppl (ACCU-CHEK GUIDE) w/Device KIT 1 Device by Does not apply route 4 (four) times daily. 1 kit 0    Blood Pressure Monitoring (BLOOD PRESSURE KIT) DEVI 1 Device by Does not apply route as needed. 1 each 0    glucose blood (ACCU-CHEK GUIDE) test strip Use 4 times daily as instructed 100 each 12     Review of Systems  Constitutional:  Positive for fatigue.  Respiratory:  Positive for cough.   Genitourinary:  Positive for vaginal discharge.  All other systems reviewed and are negative.  Physical Exam   Blood pressure 118/61, pulse 100, temperature 98.7 F (37.1 C), temperature source Oral, resp. rate 20, height 5' 2" (1.575 m), weight 117.8  kg, last menstrual period 06/24/2021, SpO2 100 %.  Physical Exam Vitals and nursing note reviewed. Exam conducted with a chaperone present.  Constitutional:      Appearance: Normal appearance. She is not ill-appearing.  Cardiovascular:     Rate and Rhythm: Normal rate and regular rhythm.     Pulses: Normal pulses.     Heart sounds: Normal heart sounds.  Pulmonary:     Effort: Pulmonary effort is normal.     Breath sounds: Normal breath sounds.  Abdominal:     Tenderness: There is no right CVA tenderness or left CVA tenderness.  Genitourinary:    Comments: Pelvic exam: External genitalia normal, vaginal walls pink and well rugated, cervix visually closed, no lesions noted. Negative pooling   Skin:    Capillary Refill: Capillary refill takes less than 2 seconds.  Neurological:     Mental Status: She is alert and oriented to person, place, and time.  Psychiatric:        Mood and Affect: Mood normal.        Behavior: Behavior normal.        Thought Content: Thought content normal.        Judgment: Judgment normal.     MAU Course  Procedures  MDM  Patient Vitals for the past 24 hrs:  BP Temp Temp src Pulse Resp SpO2 Height Weight  11/01/21 0944 122/63 -- -- 97 14 100 % -- --  11/01/21 0831 113/65 -- -- (!) 105 16 -- -- --  11/01/21 0815 118/61 98.7 F (37.1 C) Oral 100 20 100 % 5' 2" (1.575 m) 117.8 kg  11/01/21 0813 -- -- -- -- -- 100 % -- --   Results for orders placed or performed during the hospital encounter of 11/01/21 (from the past 24 hour(s))  Glucose, capillary     Status: None   Collection Time: 11/01/21  8:58 AM  Result Value Ref Range   Glucose-Capillary 99 70 - 99 mg/dL  Amnisure rupture of membrane (rom)not at [facility]     Status: None   Collection Time: 11/01/21  9:09 AM  Result Value Ref Range   Amnisure ROM NEGATIVE   Wet prep, genital     Status: None   Collection Time: 11/01/21  9:09 AM  Result Value Ref Range   Yeast Wet Prep HPF POC NONE SEEN NONE  SEEN   Trich, Wet Prep  NONE SEEN NONE SEEN   Clue Cells Wet Prep HPF POC NONE SEEN NONE SEEN   WBC, Wet Prep HPF POC <10 <10   Sperm NONE SEEN   Fern Test     Status: None   Collection Time: 11/01/21  9:26 AM  Result Value Ref Range   POCT Fern Test Negative = intact amniotic membranes    Meds ordered this encounter  Medications   benzonatate (TESSALON) 100 MG capsule    Sig: Take 1 capsule (100 mg total) by mouth every 8 (eight) hours.    Dispense:  21 capsule    Refill:  0    Order Specific Question:   Supervising Provider    Answer:   CONSTANT, PEGGY [[id number]]    Assessment and Plan  --26 y.o. G4P3003 at [redacted]w[redacted]d --FHT 148 by Doppler --Dry cough 2/2 COVID, no acute sequelae --Membranes intact --A1GDM, fasting random CBG 99 --Discharge home in stable condition  SDarlina Rumpf [place] MSN, CNM 11/01/2021, 1:27 PM

## 2021-11-01 NOTE — Discharge Instructions (Signed)

## 2021-11-04 ENCOUNTER — Ambulatory Visit: Payer: Medicaid Other | Attending: Certified Nurse Midwife

## 2021-11-04 ENCOUNTER — Ambulatory Visit: Payer: Medicaid Other | Admitting: *Deleted

## 2021-11-04 VITALS — BP 124/65 | HR 97

## 2021-11-04 DIAGNOSIS — O99210 Obesity complicating pregnancy, unspecified trimester: Secondary | ICD-10-CM | POA: Diagnosis present

## 2021-11-04 DIAGNOSIS — R8271 Bacteriuria: Secondary | ICD-10-CM | POA: Insufficient documentation

## 2021-11-04 DIAGNOSIS — Z87898 Personal history of other specified conditions: Secondary | ICD-10-CM | POA: Diagnosis present

## 2021-11-04 DIAGNOSIS — O24312 Unspecified pre-existing diabetes mellitus in pregnancy, second trimester: Secondary | ICD-10-CM

## 2021-11-04 DIAGNOSIS — E063 Autoimmune thyroiditis: Secondary | ICD-10-CM

## 2021-11-04 DIAGNOSIS — Z658 Other specified problems related to psychosocial circumstances: Secondary | ICD-10-CM

## 2021-11-04 DIAGNOSIS — O0990 Supervision of high risk pregnancy, unspecified, unspecified trimester: Secondary | ICD-10-CM

## 2021-11-04 LAB — GC/CHLAMYDIA PROBE AMP (~~LOC~~) NOT AT ARMC
Chlamydia: NEGATIVE
Comment: NEGATIVE
Comment: NORMAL
Neisseria Gonorrhea: NEGATIVE

## 2021-11-05 ENCOUNTER — Other Ambulatory Visit: Payer: Self-pay | Admitting: *Deleted

## 2021-11-05 DIAGNOSIS — Q8789 Other specified congenital malformation syndromes, not elsewhere classified: Secondary | ICD-10-CM

## 2021-11-05 DIAGNOSIS — O99212 Obesity complicating pregnancy, second trimester: Secondary | ICD-10-CM

## 2021-11-06 ENCOUNTER — Encounter: Payer: Self-pay | Admitting: Registered"

## 2021-11-06 ENCOUNTER — Encounter: Payer: Medicaid Other | Attending: Primary Care | Admitting: Registered"

## 2021-11-06 DIAGNOSIS — O24312 Unspecified pre-existing diabetes mellitus in pregnancy, second trimester: Secondary | ICD-10-CM | POA: Insufficient documentation

## 2021-11-06 NOTE — Progress Notes (Signed)
Patient was seen on 11/06/21 for Gestational Diabetes self-management class at [facility]. The following learning objectives were met by the patient during this course:  States the definition of Gestational Diabetes States why dietary management is important in controlling blood glucose Describes the effects each nutrient has on blood glucose levels Demonstrates ability to create a balanced meal plan Demonstrates carbohydrate counting  States when to check blood glucose levels Demonstrates proper blood glucose monitoring techniques States the effect of stress and exercise on blood glucose levels States the importance of limiting caffeine and abstaining from alcohol and smoking  Blood glucose monitor given: None  Patient has meter and has checked blood sugar in prior pregnancy  Patient instructed to monitor glucose levels: FBS: 60 - <95; 1 hour: <140; 2 hour: <120  Patient received handouts: Nutrition Diabetes and Pregnancy, including carb counting list  Patient will be seen for follow-up as needed.

## 2021-11-10 ENCOUNTER — Other Ambulatory Visit (INDEPENDENT_AMBULATORY_CARE_PROVIDER_SITE_OTHER): Payer: Self-pay | Admitting: Primary Care

## 2021-11-11 NOTE — Telephone Encounter (Signed)
Will forward to provider  

## 2021-11-12 ENCOUNTER — Other Ambulatory Visit (INDEPENDENT_AMBULATORY_CARE_PROVIDER_SITE_OTHER): Payer: Self-pay | Admitting: Primary Care

## 2021-11-12 NOTE — Telephone Encounter (Signed)
Medication Refill - Medication: acyclovir (ZOVIRAX) 400 MG tablet  Has the patient contacted their pharmacy? Yes.     Preferred Pharmacy (with phone number or street name):  [place] ([place]), [place] - [address] Phone:  [phone]  Fax:  [phone]     Has the patient been seen for an appointment in the last year OR does the patient have an upcoming appointment? Yes.    The patient is out of this medication and needs a refill as soon as possible. Please assist patient further. Alyssa Hartman

## 2021-11-13 ENCOUNTER — Other Ambulatory Visit (INDEPENDENT_AMBULATORY_CARE_PROVIDER_SITE_OTHER): Payer: Self-pay | Admitting: Primary Care

## 2021-11-13 ENCOUNTER — Telehealth: Payer: Self-pay | Admitting: Family Medicine

## 2021-11-13 NOTE — Telephone Encounter (Signed)
I called Alyssa Hartman and asked to clarify what medication she needs a refill of. She states she ran our of her acyclovir and her PCP who normally gives her refills said she needs to get from her OB. I asked why she is taking it and she confirms HSV 1. I asked if she is having an outbreak and she says no, but she ran out of her medicine. She states she stays at a [facility] and they told her since she ran out she should get a refill.  She wants it sent to [facility] [address]. I told her I will ask doctor and we will let her know if refill approved or not. She voices understanding. [name]

## 2021-11-13 NOTE — Telephone Encounter (Unsigned)
Copied from [place] [id number]. Topic: General - Other >> Nov 13, 2021  4:53 PM Everette C wrote: Reason for CRM: Medication Refill - Medication: acyclovir (ZOVIRAX) 400 MG tablet [[id number]]   Has the patient contacted their pharmacy? Yes.   (Agent: If no, request that the patient contact the pharmacy for the refill. If patient does not wish to contact the pharmacy document the reason why and proceed with request.) (Agent: If yes, when and what did the pharmacy advise?)  Preferred Pharmacy (with phone number or street name): [place] ([place]), [place] - [address] [place] ([place]) [place] [address] Phone: [phone] Fax: [phone] Hours: Not open 24 hours   Has the patient been seen for an appointment in the last year OR does the patient have an upcoming appointment? Yes.    Agent: Please be advised that RX refills may take up to 3 business days. We ask that you follow-up with your pharmacy.  The patient would like to be contacted further if needed

## 2021-11-13 NOTE — Telephone Encounter (Signed)
Requested medication (s) are due for refill today: yes  Requested medication (s) are on the active medication list:yes  Last refill:  10/09/21  Future visit scheduled: no  Notes to clinic:  Unable to refill per protocol, appointment needed.      Requested Prescriptions  Pending Prescriptions Disp Refills   acyclovir (ZOVIRAX) 400 MG tablet 60 tablet 0    Sig: Take 1 tablet (400 mg total) by mouth 2 (two) times daily.     Antimicrobials:  Antiviral Agents - Anti-Herpetic Failed - 11/12/2021  5:33 PM      Failed - Valid encounter within last 12 months    Recent Outpatient Visits           1 year ago Screening for STD (sexually transmitted disease)   [place] Feliberto Gottron, [place]   1 year ago Vaginal discharge   [place], [place], NP   2 years ago Vaginal discharge   [place], [place], NP   2 years ago Vaginal discharge   [place] Kerin Perna, NP   2 years ago Encounter for Papanicolaou smear for cervical cancer screening   [place] Kerin Perna, NP

## 2021-11-13 NOTE — Telephone Encounter (Signed)
Patient called in needing a refill on cyclomed.

## 2021-11-14 MED ORDER — ACYCLOVIR 400 MG PO TABS
400.0000 mg | ORAL_TABLET | Freq: Two times a day (BID) | ORAL | 0 refills | Status: DC
Start: 2021-11-14 — End: 2021-11-26

## 2021-11-14 NOTE — Telephone Encounter (Signed)
Will forward to provider  

## 2021-11-14 NOTE — Telephone Encounter (Signed)
Pt called, advised she was due for appt for med refill. Pt states she was told yesterday by another nurse that Sharyn Lull, NP wouldn't refill that and she could call OBGYN for refill. Pt scheduled appt for 11/26/21. Advised would send request for refill to provider.

## 2021-11-14 NOTE — Telephone Encounter (Signed)
Requested medication (s) are due for refill today: yes  Requested medication (s) are on the active medication list: yes  Last refill:  10/09/21 #60/0  Future visit scheduled: yes 11/26/21  Notes to clinic:  pt scheduled appt for 11/26/21 for med refill and asking if med can be sent prior to appt. Please review.      Requested Prescriptions  Pending Prescriptions Disp Refills   acyclovir (ZOVIRAX) 400 MG tablet 60 tablet 0    Sig: Take 1 tablet (400 mg total) by mouth 2 (two) times daily.     Antimicrobials:  Antiviral Agents - Anti-Herpetic Failed - 11/13/2021  5:39 PM      Failed - Valid encounter within last 12 months    Recent Outpatient Visits           1 year ago Screening for STD (sexually transmitted disease)   [place] Feliberto Gottron, [place]   1 year ago Vaginal discharge   [place], [place], NP   2 years ago Vaginal discharge   [place], [employer], NP   2 years ago Vaginal discharge   [place], Michelle P, NP   2 years ago Encounter for Papanicolaou smear for cervical cancer screening   [place], Michelle P, NP       Future Appointments             In 1 week Kerin Perna, NP [place]

## 2021-11-15 DIAGNOSIS — B009 Herpesviral infection, unspecified: Secondary | ICD-10-CM

## 2021-11-15 MED ORDER — VALACYCLOVIR HCL 500 MG PO TABS: 500.0000 mg | ORAL_TABLET | Freq: Every day | ORAL | 11 refills | Status: DC

## 2021-11-15 NOTE — Telephone Encounter (Signed)
RN attempted to call pt at number on file twice. 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 MD felt that this was an odd request from shelter. MyChart message sent to pt to confirm she wants suppression medication. If pt wants to proceed with medication for HSV, Valtrex 500mg  Daily per MD. Pt request medication be sent to [facility].

## 2021-11-15 NOTE — Addendum Note (Signed)
Addended by: Madalyn Rob D on: 11/15/2021 09:51 AM   Modules accepted: Orders

## 2021-11-18 ENCOUNTER — Encounter (HOSPITAL_COMMUNITY): Payer: Self-pay

## 2021-11-18 ENCOUNTER — Other Ambulatory Visit: Payer: Self-pay

## 2021-11-18 ENCOUNTER — Emergency Department (HOSPITAL_COMMUNITY)
Admission: EM | Admit: 2021-11-18 | Discharge: 2021-11-18 | Discharge status: Against Medical Advice | Payer: Medicaid Other | Attending: Physician Assistant | Admitting: Physician Assistant

## 2021-11-18 ENCOUNTER — Emergency Department (HOSPITAL_COMMUNITY): Payer: Medicaid Other

## 2021-11-18 DIAGNOSIS — O219 Vomiting of pregnancy, unspecified: Secondary | ICD-10-CM | POA: Diagnosis present

## 2021-11-18 DIAGNOSIS — Z3A21 21 weeks gestation of pregnancy: Secondary | ICD-10-CM | POA: Insufficient documentation

## 2021-11-18 DIAGNOSIS — Z5321 Procedure and treatment not carried out due to patient leaving prior to being seen by health care provider: Secondary | ICD-10-CM | POA: Diagnosis not present

## 2021-11-18 DIAGNOSIS — R059 Cough, unspecified: Secondary | ICD-10-CM | POA: Insufficient documentation

## 2021-11-18 NOTE — ED Notes (Signed)
Called for recheck vitals, no answer x1

## 2021-11-18 NOTE — ED Triage Notes (Addendum)
Reports havinjg coughing spells everyday and worse at night. REports it causes her to vomit. X 1 moonth  21 weeks preg

## 2021-11-18 NOTE — ED Notes (Signed)
Pt called multiple times for recheck vitals, no answer 

## 2021-11-18 NOTE — ED Provider Triage Note (Signed)
Emergency Medicine Provider Triage Evaluation Note  Alyssa Hartman , Hartman 26 y.o. female  was evaluated in triage.  Pt complains of cough.  Has been ongoing for weeks.  Productive of clear sputum.  She is currently [redacted] weeks pregnant.  Positive for fetal movement.  No current fluid leakage, bleeding.  No chest pain, shortness of breath.  States she was seen by [facility] who noted she had been wheezing and told Hartman she needed reevaluation.  She has been seen by MA for this previously. COVID 1 month ago. No LE swelling, PND, orthopnea  Leaked fluid when coughing 2 weeks ago, Seen by MAU, No pPROM  Review of Systems  Positive: Cough, wheeze Negative: Bleeding, fluid leakage  Physical Exam  LMP 06/24/2021  Gen:   Awake, no distress   Resp:  Normal effort  MSK:   Moves extremities without difficulty  ABD:  Gravid abd Other:    Medical Decision Making  Medically screening exam initiated at 3:33 PM.  Appropriate orders placed.  Alyssa Hartman was informed that the remainder of the evaluation will be completed by another provider, this initial triage assessment does not replace that evaluation, and the importance of remaining in the ED until their evaluation is complete.  Cough, wheeze  Lungs clear currently. +FM, no fluid leakage, bleeding   Alyssa Gully A, PA-C 11/18/21 1535

## 2021-11-22 ENCOUNTER — Encounter (HOSPITAL_COMMUNITY): Payer: Self-pay | Admitting: Obstetrics & Gynecology

## 2021-11-22 ENCOUNTER — Inpatient Hospital Stay (HOSPITAL_COMMUNITY)
Admission: AD | Admit: 2021-11-22 | Discharge: 2021-11-23 | Disposition: A | Discharge status: Home / Self Care | Payer: Medicaid Other | Attending: Obstetrics & Gynecology | Admitting: Obstetrics & Gynecology

## 2021-11-22 DIAGNOSIS — Z3A21 21 weeks gestation of pregnancy: Secondary | ICD-10-CM | POA: Diagnosis not present

## 2021-11-22 DIAGNOSIS — O98512 Other viral diseases complicating pregnancy, second trimester: Secondary | ICD-10-CM | POA: Diagnosis present

## 2021-11-22 DIAGNOSIS — J45909 Unspecified asthma, uncomplicated: Secondary | ICD-10-CM | POA: Diagnosis not present

## 2021-11-22 DIAGNOSIS — O99512 Diseases of the respiratory system complicating pregnancy, second trimester: Secondary | ICD-10-CM | POA: Insufficient documentation

## 2021-11-22 DIAGNOSIS — R8271 Bacteriuria: Secondary | ICD-10-CM

## 2021-11-22 DIAGNOSIS — O0990 Supervision of high risk pregnancy, unspecified, unspecified trimester: Secondary | ICD-10-CM

## 2021-11-22 DIAGNOSIS — Z658 Other specified problems related to psychosocial circumstances: Secondary | ICD-10-CM

## 2021-11-22 DIAGNOSIS — O24312 Unspecified pre-existing diabetes mellitus in pregnancy, second trimester: Secondary | ICD-10-CM

## 2021-11-22 DIAGNOSIS — O99282 Endocrine, nutritional and metabolic diseases complicating pregnancy, second trimester: Secondary | ICD-10-CM | POA: Insufficient documentation

## 2021-11-22 DIAGNOSIS — O99519 Diseases of the respiratory system complicating pregnancy, unspecified trimester: Secondary | ICD-10-CM | POA: Diagnosis not present

## 2021-11-22 DIAGNOSIS — O99820 Streptococcus B carrier state complicating pregnancy: Secondary | ICD-10-CM | POA: Insufficient documentation

## 2021-11-22 DIAGNOSIS — O99322 Drug use complicating pregnancy, second trimester: Secondary | ICD-10-CM | POA: Diagnosis present

## 2021-11-22 MED ORDER — IPRATROPIUM-ALBUTEROL 0.5-2.5 (3) MG/3ML IN SOLN
3.0000 mL | Freq: Once | RESPIRATORY_TRACT | Status: AC
  Administered 2021-11-22: 3 mL via RESPIRATORY_TRACT
  Filled 2021-11-22: qty 3

## 2021-11-22 NOTE — MAU Provider Note (Signed)
History     CSN: [id number]  Arrival date and time: 11/22/21 2230   Event Date/Time   First Provider Initiated Contact with Patient 11/22/21 2330      No chief complaint on file.  HPI  Alyssa Hartman is a 26 y.o. [phone] at 36w4dwho presents for evaluation of cough and wheezing. Patient reports since she had COVID a month ago, she has had a nightly cough. She reports she has tried cough medicine and cough drops with no relief. [facility] but they wouldn't prescribe her an inhaler. She denies any pain.   She denies any vaginal bleeding, discharge, and leaking of fluid. Denies any constipation, diarrhea or any urinary complaints. Reports normal fetal movement.   OB History     Gravida  4   Para  3   Term  3   Preterm  0   AB      Living  3      SAB      IAB      Ectopic      Multiple  0   Live Births  3           Past Medical History:  Diagnosis Date   Abscess of right axilla    Anxiety    BV (bacterial vaginosis)    Carpal tunnel syndrome    Depression    GBS (group B Streptococcus carrier), +RV culture, currently pregnant 03/16/2018   GDM (gestational diabetes mellitus)    Gestational diabetes 02/03/2018   Current Diabetic Medications:  None   Required Referrals for A1GDM or A2GDM: [ ]  Diabetes Education and Testing Supplies [ ]  Nutrition Cousult   Baseline and surveillance [facility] as needed)  Lab Results  Component Value Date   CREATININE 0.96 05/08/2017   AST 32 05/08/2017   ALT 29 05/08/2017   TSH 3.830 11/10/2017   Lab Results  Component Value Date   HGBA1C 5.7 (   HSV-1 (herpes simplex virus 1) infection    Hx of Hashimoto thyroiditis    Hypothyroid     Past Surgical History:  Procedure Laterality Date   NO PAST SURGERIES      Family History  Problem Relation Age of Onset   Asthma Sister    Thyroid disease Sister    Cancer Paternal Uncle     Stroke Maternal Grandmother    Diabetes Maternal Grandmother    Hypertension Maternal Grandmother    Cancer Other    Hearing loss Neg Hx     Social History   Tobacco Use   Smoking status: Former    Types: Cigarettes    Quit date: 03/18/2013    Years since quitting: 8.6   Smokeless tobacco: Never  Vaping Use   Vaping Use: Former   Substances: Nicotine  Substance Use Topics   Alcohol use: Not Currently   Drug use: Not Currently    Types: Marijuana    Comment: last used January 2023    Allergies:  Allergies  Allergen Reactions   Milk-Related Compounds     Medications Prior to Admission  Medication Sig Dispense Refill Last Dose   famotidine (PEPCID) 20 MG tablet Take 1 tablet (20 mg total) by mouth at bedtime. 30 tablet 1 11/22/2021   levothyroxine (SYNTHROID) 125 MCG tablet Take 1 tablet (125 mcg total) by mouth daily before breakfast. 30 tablet 5 11/22/2021  Prenatal Vit-Fe Fumarate-FA (PRENATAL VITAMIN) 27-0.8 MG TABS Take 1 tablet by mouth daily. 30 tablet 11 11/22/2021   valACYclovir (VALTREX) 500 MG tablet Take 1 tablet (500 mg total) by mouth daily. 30 tablet 11 11/22/2021   Accu-Chek Softclix Lancets lancets Use 4 times daily as instructed (Patient not taking: Reported on 11/04/2021) 100 each 12    acyclovir (ZOVIRAX) 400 MG tablet Take 1 tablet (400 mg total) by mouth 2 (two) times daily. (Patient not taking: Reported on 11/22/2021) 60 tablet 0 Not Taking   benzonatate (TESSALON) 100 MG capsule Take 1 capsule (100 mg total) by mouth every 8 (eight) hours. (Patient not taking: Reported on 11/22/2021) 21 capsule 0 Not Taking   Blood Glucose Monitoring Suppl (ACCU-CHEK GUIDE) w/Device KIT 1 Device by Does not apply route 4 (four) times daily. (Patient not taking: Reported on 11/04/2021) 1 kit 0    Blood Pressure Monitoring (BLOOD PRESSURE KIT) DEVI 1 Device by Does not apply route as needed. (Patient not taking: Reported on 11/04/2021) 1 each 0    glucose blood (ACCU-CHEK  GUIDE) test strip Use 4 times daily as instructed (Patient not taking: Reported on 11/04/2021) 100 each 12     Review of Systems  Constitutional: Negative.  Negative for fatigue and fever.  HENT: Negative.    Respiratory:  Positive for cough, shortness of breath and wheezing.   Cardiovascular: Negative.  Negative for chest pain.  Gastrointestinal: Negative.  Negative for abdominal pain, constipation, diarrhea, nausea and vomiting.  Genitourinary: Negative.  Negative for dysuria.  Neurological: Negative.  Negative for dizziness and headaches.   Physical Exam   Blood pressure 107/67, pulse 94, temperature 98.3 F (36.8 C), temperature source Oral, resp. rate (!) 24, height 5' 2"  (1.575 m), weight 119 kg, last menstrual period 06/24/2021, SpO2 100 %.  Patient Vitals for the past 24 hrs:  BP Temp Temp src Pulse Resp SpO2 Height Weight  11/22/21 2253 107/67 98.3 F (36.8 C) Oral 94 (!) 24 100 % 5' 2"  (1.575 m) 119 kg    Physical Exam Vitals and nursing note reviewed.  Constitutional:      General: She is not in acute distress.    Appearance: She is well-developed.  HENT:     Head: Normocephalic.  Eyes:     Pupils: Pupils are equal, round, and reactive to light.  Cardiovascular:     Rate and Rhythm: Normal rate and regular rhythm.     Heart sounds: Normal heart sounds.  Pulmonary:     Effort: Pulmonary effort is normal. No respiratory distress.     Breath sounds: Normal breath sounds.  Abdominal:     General: Bowel sounds are normal. There is no distension.     Palpations: Abdomen is soft.     Tenderness: There is no abdominal tenderness.  Skin:    General: Skin is warm and dry.  Neurological:     Mental Status: She is alert and oriented to person, place, and time.  Psychiatric:        Mood and Affect: Mood normal.        Behavior: Behavior normal.        Thought Content: Thought content normal.        Judgment: Judgment normal.    FHT: 140 bpm  MAU Course   Procedures  MDM  Duoneb- patient reports complete resolution of symptoms Patient has follow up with Family Medicine on 10/3  Assessment and Plan   1. Asthma during pregnancy  2. [redacted] weeks gestation of pregnancy     -Discharge home in stable condition -Rx for albuterol sent to pharmacy -Asthma precautions discussed -Patient advised to follow-up with OB as scheduled for prenatal care -Patient may return to MAU as needed or if her condition were to change or worsen  Wende Mott, CNM 11/23/2021, 11:30 PM

## 2021-11-22 NOTE — MAU Note (Signed)
Pt says has a cough  - started in Aug  No cough during day  Starts at 6 pm -  Back hurts from coughing  [facility]- gave breathing tx- last week  [facility]- clinic

## 2021-11-23 DIAGNOSIS — O99519 Diseases of the respiratory system complicating pregnancy, unspecified trimester: Secondary | ICD-10-CM | POA: Diagnosis not present

## 2021-11-23 DIAGNOSIS — Z3A21 21 weeks gestation of pregnancy: Secondary | ICD-10-CM | POA: Diagnosis not present

## 2021-11-23 DIAGNOSIS — J45909 Unspecified asthma, uncomplicated: Secondary | ICD-10-CM | POA: Diagnosis not present

## 2021-11-23 MED ORDER — VENTOLIN HFA 108 (90 BASE) MCG/ACT IN AERS: 1.0000 | INHALATION_SPRAY | RESPIRATORY_TRACT | 2 refills | Status: DC | PRN

## 2021-11-23 NOTE — Discharge Instructions (Signed)

## 2021-11-26 ENCOUNTER — Ambulatory Visit (INDEPENDENT_AMBULATORY_CARE_PROVIDER_SITE_OTHER): Payer: Medicaid Other | Admitting: Primary Care

## 2021-11-26 ENCOUNTER — Encounter (INDEPENDENT_AMBULATORY_CARE_PROVIDER_SITE_OTHER): Payer: Self-pay

## 2021-12-02 ENCOUNTER — Encounter: Payer: Medicaid Other | Admitting: Obstetrics and Gynecology

## 2021-12-03 ENCOUNTER — Encounter: Payer: Self-pay | Admitting: Family Medicine

## 2021-12-03 ENCOUNTER — Ambulatory Visit (INDEPENDENT_AMBULATORY_CARE_PROVIDER_SITE_OTHER): Payer: Medicaid Other | Admitting: Family Medicine

## 2021-12-03 ENCOUNTER — Ambulatory Visit: Payer: Medicaid Other

## 2021-12-03 ENCOUNTER — Ambulatory Visit: Payer: Medicaid Other | Attending: Obstetrics

## 2021-12-03 VITALS — BP 123/81 | HR 91

## 2021-12-03 VITALS — BP 113/63 | HR 88

## 2021-12-03 DIAGNOSIS — Z87898 Personal history of other specified conditions: Secondary | ICD-10-CM

## 2021-12-03 DIAGNOSIS — O285 Abnormal chromosomal and genetic finding on antenatal screening of mother: Secondary | ICD-10-CM

## 2021-12-03 DIAGNOSIS — O0990 Supervision of high risk pregnancy, unspecified, unspecified trimester: Secondary | ICD-10-CM

## 2021-12-03 DIAGNOSIS — Z6841 Body Mass Index (BMI) 40.0 and over, adult: Secondary | ICD-10-CM

## 2021-12-03 DIAGNOSIS — O24312 Unspecified pre-existing diabetes mellitus in pregnancy, second trimester: Secondary | ICD-10-CM | POA: Diagnosis present

## 2021-12-03 DIAGNOSIS — O99212 Obesity complicating pregnancy, second trimester: Secondary | ICD-10-CM | POA: Diagnosis present

## 2021-12-03 DIAGNOSIS — E669 Obesity, unspecified: Secondary | ICD-10-CM

## 2021-12-03 DIAGNOSIS — Z3A23 23 weeks gestation of pregnancy: Secondary | ICD-10-CM

## 2021-12-03 DIAGNOSIS — O99619 Diseases of the digestive system complicating pregnancy, unspecified trimester: Secondary | ICD-10-CM

## 2021-12-03 DIAGNOSIS — Z658 Other specified problems related to psychosocial circumstances: Secondary | ICD-10-CM

## 2021-12-03 DIAGNOSIS — R8271 Bacteriuria: Secondary | ICD-10-CM | POA: Diagnosis present

## 2021-12-03 DIAGNOSIS — Q8789 Other specified congenital malformation syndromes, not elsewhere classified: Secondary | ICD-10-CM | POA: Insufficient documentation

## 2021-12-03 DIAGNOSIS — R768 Other specified abnormal immunological findings in serum: Secondary | ICD-10-CM

## 2021-12-03 DIAGNOSIS — Z8489 Family history of other specified conditions: Secondary | ICD-10-CM

## 2021-12-03 DIAGNOSIS — D573 Sickle-cell trait: Secondary | ICD-10-CM

## 2021-12-03 DIAGNOSIS — K219 Gastro-esophageal reflux disease without esophagitis: Secondary | ICD-10-CM

## 2021-12-03 DIAGNOSIS — E079 Disorder of thyroid, unspecified: Secondary | ICD-10-CM

## 2021-12-03 DIAGNOSIS — O99280 Endocrine, nutritional and metabolic diseases complicating pregnancy, unspecified trimester: Secondary | ICD-10-CM

## 2021-12-03 DIAGNOSIS — F319 Bipolar disorder, unspecified: Secondary | ICD-10-CM

## 2021-12-03 DIAGNOSIS — E063 Autoimmune thyroiditis: Secondary | ICD-10-CM

## 2021-12-03 DIAGNOSIS — J208 Acute bronchitis due to other specified organisms: Secondary | ICD-10-CM

## 2021-12-03 MED ORDER — FAMOTIDINE 20 MG PO TABS: 20.0000 mg | ORAL_TABLET | Freq: Every day | ORAL | 1 refills | Status: AC

## 2021-12-03 MED ORDER — DEXCOM G6 TRANSMITTER MISC: 1.0000 | 3 refills | Status: DC

## 2021-12-03 MED ORDER — VENTOLIN HFA 108 (90 BASE) MCG/ACT IN AERS: 1.0000 | INHALATION_SPRAY | RESPIRATORY_TRACT | 2 refills | Status: AC | PRN

## 2021-12-03 MED ORDER — DEXCOM G6 SENSOR MISC: 11 refills | Status: DC

## 2021-12-03 MED ORDER — ASPIRIN 81 MG PO TBEC: 81.0000 mg | DELAYED_RELEASE_TABLET | Freq: Every day | ORAL | 12 refills | Status: DC

## 2021-12-03 NOTE — Progress Notes (Signed)
   Subjective:  Alyssa Hartman is a 26 y.o. [phone] at [redacted]w[redacted]d being seen today for ongoing prenatal care.  She is currently monitored for the following issues for this high-risk pregnancy and has Bipolar 1 disorder ([name]); Hashimoto's thyroiditis; Obesity; Biological false positive RPR test; History of genetic disease; Supervision of high risk pregnancy, antepartum; Psychosocial stressors; GBS bacteriuria; and Preexisting diabetes complicating pregnancy in second trimester, antepartum on their problem list.  Patient reports no complaints.  Contractions: Not present. Vag. Bleeding: None.  Movement: Present. Denies leaking of fluid.   The following portions of the patient's history were reviewed and updated as appropriate: allergies, current medications, past family history, past medical history, past social history, past surgical history and problem list. Problem list updated.  Objective:   Vitals:   12/03/21 0857  BP: 123/81  Pulse: 91    Fetal Status: Fetal Heart Rate (bpm): 130   Movement: Present     General:  Alert, oriented and cooperative. Patient is in no acute distress.  Skin: Skin is warm and dry. No rash noted.   Cardiovascular: Normal heart rate noted  Respiratory: Normal respiratory effort, no problems with respiration noted  Abdomen: Soft, gravid, appropriate for gestational age. Pain/Pressure: Absent     Pelvic: Vag. Bleeding: None     Cervical exam deferred        Extremities: Normal range of motion.  Edema: None  Mental Status: Normal mood and affect. Normal behavior. Normal judgment and thought content.   Urinalysis:      Assessment and Plan:  Pregnancy: G4P3003 at [redacted]w[redacted]d  1. Supervision of high risk pregnancy, antepartum BP and FHR normal Refill sent for pepcid and albuterol inhaler  2. Preexisting diabetes complicating pregnancy in second trimester, antepartum Not checking sugars Interested in a CGM, will get this coordinated Following w MFM for growth  scans Not yet on ASA, discussed importance, rx sent to pharmacy  3. Hashimoto's thyroiditis On synthroid 125 mcg daily Last TSH mildly suppressed, normal free T4 Recheck with third trimester labs  4. Bipolar 1 disorder ([name]) Endorses this diagnosis Not on any meds Interested in connecting with Psych, encouraged to go do intake across the street at [facility]  5. Biological false positive RPR test T pall Ab's neg  6. Class 3 severe obesity due to excess calories without serious comorbidity with body mass index (BMI) of 40.0 to 44.9 in adult [name]) Antenatal testing to begin at 34 weeks per MFM if not already initiated  7. GBS bacteriuria At new OB visit Ppx in labor  8. Psychosocial stressors [facility]  9. History of genetic disease Prior FOB affected with genetic syndrome Different FOB this pregnancy  Preterm labor symptoms and general obstetric precautions including but not limited to vaginal bleeding, contractions, leaking of fluid and fetal movement were reviewed in detail with the patient. Please refer to After Visit Summary for other counseling recommendations.  Return in 4 weeks (on 12/31/2021) for [facility], ob visit, 28 wk labs, needs MD.   Clarnce Flock, MD

## 2021-12-03 NOTE — Patient Instructions (Signed)

## 2021-12-03 NOTE — Addendum Note (Signed)
Addended by: Louisa Second E on: 12/03/2021 01:37 PM   Modules accepted: Orders

## 2021-12-03 NOTE — Addendum Note (Signed)
Addended by: Louisa Second E on: 12/03/2021 10:13 AM   Modules accepted: Orders

## 2021-12-04 ENCOUNTER — Other Ambulatory Visit: Payer: Self-pay | Admitting: *Deleted

## 2021-12-04 DIAGNOSIS — O99213 Obesity complicating pregnancy, third trimester: Secondary | ICD-10-CM

## 2021-12-17 NOTE — Telephone Encounter (Signed)
Called pt to follow up on MyChart message; no answer so MyChart message sent.

## 2022-01-03 ENCOUNTER — Encounter: Payer: Self-pay | Admitting: Obstetrics and Gynecology

## 2022-01-03 ENCOUNTER — Ambulatory Visit (INDEPENDENT_AMBULATORY_CARE_PROVIDER_SITE_OTHER): Payer: Medicaid Other | Admitting: Obstetrics and Gynecology

## 2022-01-03 ENCOUNTER — Other Ambulatory Visit: Payer: Self-pay

## 2022-01-03 ENCOUNTER — Ambulatory Visit: Payer: Medicaid Other

## 2022-01-03 VITALS — BP 106/60 | HR 93 | Wt 256.6 lb

## 2022-01-03 DIAGNOSIS — O0992 Supervision of high risk pregnancy, unspecified, second trimester: Secondary | ICD-10-CM | POA: Diagnosis not present

## 2022-01-03 DIAGNOSIS — Z3009 Encounter for other general counseling and advice on contraception: Secondary | ICD-10-CM

## 2022-01-03 DIAGNOSIS — Z3A27 27 weeks gestation of pregnancy: Secondary | ICD-10-CM | POA: Diagnosis not present

## 2022-01-03 DIAGNOSIS — E063 Autoimmune thyroiditis: Secondary | ICD-10-CM

## 2022-01-03 DIAGNOSIS — Z23 Encounter for immunization: Secondary | ICD-10-CM | POA: Diagnosis not present

## 2022-01-03 DIAGNOSIS — R8271 Bacteriuria: Secondary | ICD-10-CM | POA: Diagnosis not present

## 2022-01-03 DIAGNOSIS — O0990 Supervision of high risk pregnancy, unspecified, unspecified trimester: Secondary | ICD-10-CM

## 2022-01-03 DIAGNOSIS — O24312 Unspecified pre-existing diabetes mellitus in pregnancy, second trimester: Secondary | ICD-10-CM | POA: Diagnosis not present

## 2022-01-03 LAB — GLUCOSE, CAPILLARY: Glucose-Capillary: 89 mg/dL (ref 70–99)

## 2022-01-03 NOTE — Patient Instructions (Signed)

## 2022-01-03 NOTE — Progress Notes (Signed)
Subjective:  Alyssa Hartman is a 26 y.o. [phone] at [redacted]w[redacted]d being seen today for ongoing prenatal care.  She is currently monitored for the following issues for this high-risk pregnancy and has Bipolar 1 disorder (HCC); Hashimoto's thyroiditis; Obesity; Biological false positive RPR test; History of genetic disease; Unwanted fertility; Supervision of high risk pregnancy, antepartum; Psychosocial stressors; GBS bacteriuria; and Preexisting diabetes complicating pregnancy in second trimester, antepartum on their problem list.  Patient reports no complaints.  Contractions: Not present. Vag. Bleeding: None.  Movement: Present. Denies leaking of fluid.   The following portions of the patient's history were reviewed and updated as appropriate: allergies, current medications, past family history, past medical history, past social history, past surgical history and problem list. Problem list updated.  Objective:   Vitals:   01/03/22 0816  BP: 106/60  Pulse: 93  Weight: 256 lb 9.6 oz (116.4 kg)    Fetal Status: Fetal Heart Rate (bpm): 130   Movement: Present     General:  Alert, oriented and cooperative. Patient is in no acute distress.  Skin: Skin is warm and dry. No rash noted.   Cardiovascular: Normal heart rate noted  Respiratory: Normal respiratory effort, no problems with respiration noted  Abdomen: Soft, gravid, appropriate for gestational age. Pain/Pressure: Present     Pelvic:  Cervical exam deferred        Extremities: Normal range of motion.     Mental Status: Normal mood and affect. Normal behavior. Normal judgment and thought content.   Urinalysis:      Assessment and Plan:  Pregnancy: G4P3003 at [redacted]w[redacted]d  1. Supervision of high risk pregnancy, antepartum Stable   2. Need for Tdap vaccination  - Tdap vaccine greater than or equal to 7yo IM  3. GBS bacteriuria Tx while in labor  4. Preexisting diabetes complicating pregnancy in second trimester, antepartum No checking  CBG's. Fasting CBG in office today, 89 Discussed importance of checking CBG's. Pt has problems with monitor. Will schedule appt with Marylene Land to discuss  Serial growth scans and antenatal testing as per MFM protocol  5. Hashimoto's thyroiditis Pt informed refills are available  6. Unwanted fertility BTL papers signed today  Preterm labor symptoms and general obstetric precautions including but not limited to vaginal bleeding, contractions, leaking of fluid and fetal movement were reviewed in detail with the patient. Please refer to After Visit Summary for other counseling recommendations.  Return in about 2 weeks (around 01/17/2022) for OB visit, face to face, MD only.   Hermina Staggers, MD

## 2022-01-06 ENCOUNTER — Encounter: Payer: Self-pay | Admitting: *Deleted

## 2022-01-06 DIAGNOSIS — O98519 Other viral diseases complicating pregnancy, unspecified trimester: Secondary | ICD-10-CM

## 2022-01-07 ENCOUNTER — Other Ambulatory Visit: Payer: Self-pay

## 2022-01-07 ENCOUNTER — Inpatient Hospital Stay (HOSPITAL_COMMUNITY): Payer: Medicaid Other

## 2022-01-07 ENCOUNTER — Ambulatory Visit: Payer: Medicaid Other | Admitting: *Deleted

## 2022-01-07 ENCOUNTER — Ambulatory Visit: Payer: Medicaid Other | Attending: Obstetrics

## 2022-01-07 ENCOUNTER — Encounter (HOSPITAL_COMMUNITY): Payer: Self-pay | Admitting: Obstetrics and Gynecology

## 2022-01-07 ENCOUNTER — Ambulatory Visit (INDEPENDENT_AMBULATORY_CARE_PROVIDER_SITE_OTHER): Payer: Medicaid Other | Admitting: Advanced Practice Midwife

## 2022-01-07 ENCOUNTER — Inpatient Hospital Stay (HOSPITAL_COMMUNITY)
Admission: AD | Admit: 2022-01-07 | Discharge: 2022-01-07 | Disposition: A | Discharge status: Home / Self Care | Payer: Medicaid Other | Attending: Obstetrics and Gynecology | Admitting: Obstetrics and Gynecology

## 2022-01-07 VITALS — BP 99/56 | HR 126

## 2022-01-07 VITALS — BP 102/61 | HR 110

## 2022-01-07 DIAGNOSIS — O24312 Unspecified pre-existing diabetes mellitus in pregnancy, second trimester: Secondary | ICD-10-CM

## 2022-01-07 DIAGNOSIS — Z3689 Encounter for other specified antenatal screening: Secondary | ICD-10-CM

## 2022-01-07 DIAGNOSIS — Z1152 Encounter for screening for COVID-19: Secondary | ICD-10-CM | POA: Insufficient documentation

## 2022-01-07 DIAGNOSIS — O0990 Supervision of high risk pregnancy, unspecified, unspecified trimester: Secondary | ICD-10-CM | POA: Insufficient documentation

## 2022-01-07 DIAGNOSIS — O24410 Gestational diabetes mellitus in pregnancy, diet controlled: Secondary | ICD-10-CM | POA: Diagnosis not present

## 2022-01-07 DIAGNOSIS — O99283 Endocrine, nutritional and metabolic diseases complicating pregnancy, third trimester: Secondary | ICD-10-CM | POA: Insufficient documentation

## 2022-01-07 DIAGNOSIS — O99213 Obesity complicating pregnancy, third trimester: Secondary | ICD-10-CM | POA: Diagnosis not present

## 2022-01-07 DIAGNOSIS — O99513 Diseases of the respiratory system complicating pregnancy, third trimester: Secondary | ICD-10-CM | POA: Diagnosis not present

## 2022-01-07 DIAGNOSIS — O98513 Other viral diseases complicating pregnancy, third trimester: Secondary | ICD-10-CM | POA: Insufficient documentation

## 2022-01-07 DIAGNOSIS — Z8489 Family history of other specified conditions: Secondary | ICD-10-CM

## 2022-01-07 DIAGNOSIS — E079 Disorder of thyroid, unspecified: Secondary | ICD-10-CM

## 2022-01-07 DIAGNOSIS — J4531 Mild persistent asthma with (acute) exacerbation: Secondary | ICD-10-CM

## 2022-01-07 DIAGNOSIS — R062 Wheezing: Secondary | ICD-10-CM | POA: Diagnosis not present

## 2022-01-07 DIAGNOSIS — Z658 Other specified problems related to psychosocial circumstances: Secondary | ICD-10-CM

## 2022-01-07 DIAGNOSIS — E669 Obesity, unspecified: Secondary | ICD-10-CM | POA: Diagnosis not present

## 2022-01-07 DIAGNOSIS — O99323 Drug use complicating pregnancy, third trimester: Secondary | ICD-10-CM | POA: Insufficient documentation

## 2022-01-07 DIAGNOSIS — R053 Chronic cough: Secondary | ICD-10-CM | POA: Diagnosis not present

## 2022-01-07 DIAGNOSIS — R8271 Bacteriuria: Secondary | ICD-10-CM | POA: Insufficient documentation

## 2022-01-07 DIAGNOSIS — D563 Thalassemia minor: Secondary | ICD-10-CM

## 2022-01-07 DIAGNOSIS — J45901 Unspecified asthma with (acute) exacerbation: Secondary | ICD-10-CM | POA: Diagnosis not present

## 2022-01-07 DIAGNOSIS — O3510X0 Maternal care for (suspected) chromosomal abnormality in fetus, unspecified, not applicable or unspecified: Secondary | ICD-10-CM | POA: Diagnosis not present

## 2022-01-07 DIAGNOSIS — O285 Abnormal chromosomal and genetic finding on antenatal screening of mother: Secondary | ICD-10-CM

## 2022-01-07 DIAGNOSIS — Z3A28 28 weeks gestation of pregnancy: Secondary | ICD-10-CM | POA: Diagnosis not present

## 2022-01-07 DIAGNOSIS — O99820 Streptococcus B carrier state complicating pregnancy: Secondary | ICD-10-CM | POA: Diagnosis present

## 2022-01-07 DIAGNOSIS — O26893 Other specified pregnancy related conditions, third trimester: Secondary | ICD-10-CM | POA: Diagnosis not present

## 2022-01-07 DIAGNOSIS — J45909 Unspecified asthma, uncomplicated: Secondary | ICD-10-CM | POA: Insufficient documentation

## 2022-01-07 LAB — CBC WITH DIFFERENTIAL/PLATELET
Abs Immature Granulocytes: 0.04 10*3/uL (ref 0.00–0.07)
Basophils Absolute: 0 10*3/uL (ref 0.0–0.1)
Basophils Relative: 0 %
Eosinophils Absolute: 0.3 10*3/uL (ref 0.0–0.5)
Eosinophils Relative: 3 %
HCT: 33.8 % — ABNORMAL LOW (ref 36.0–46.0)
Hemoglobin: 10.6 g/dL — ABNORMAL LOW (ref 12.0–15.0)
Immature Granulocytes: 0 %
Lymphocytes Relative: 13 %
Lymphs Abs: 1.2 10*3/uL (ref 0.7–4.0)
MCH: 25.4 pg — ABNORMAL LOW (ref 26.0–34.0)
MCHC: 31.4 g/dL (ref 30.0–36.0)
MCV: 80.9 fL (ref 80.0–100.0)
Monocytes Absolute: 0.8 10*3/uL (ref 0.1–1.0)
Monocytes Relative: 9 %
Neutro Abs: 7 10*3/uL (ref 1.7–7.7)
Neutrophils Relative %: 75 %
Platelets: 232 10*3/uL (ref 150–400)
RBC: 4.18 MIL/uL (ref 3.87–5.11)
RDW: 15.2 % (ref 11.5–15.5)
WBC: 9.3 10*3/uL (ref 4.0–10.5)
nRBC: 0 % (ref 0.0–0.2)

## 2022-01-07 LAB — BASIC METABOLIC PANEL
Anion gap: 8 (ref 5–15)
BUN: <5 mg/dL — ABNORMAL LOW (ref 6–20)
CO2: 20 mmol/L — ABNORMAL LOW (ref 22–32)
Calcium: 8.6 mg/dL — ABNORMAL LOW (ref 8.9–10.3)
Chloride: 106 mmol/L (ref 98–111)
Creatinine, Ser: 0.77 mg/dL (ref 0.44–1.00)
GFR, Estimated: >60 mL/min (ref >60)
Glucose, Bld: 126 mg/dL — ABNORMAL HIGH (ref 70–99)
Potassium: 3.4 mmol/L — ABNORMAL LOW (ref 3.5–5.1)
Sodium: 134 mmol/L — ABNORMAL LOW (ref 135–145)

## 2022-01-07 LAB — URINALYSIS, ROUTINE W REFLEX MICROSCOPIC
Bilirubin Urine: NEGATIVE
Glucose, UA: NEGATIVE mg/dL
Hgb urine dipstick: NEGATIVE
Ketones, ur: 80 mg/dL — AB
Leukocytes,Ua: NEGATIVE
Nitrite: NEGATIVE
Protein, ur: NEGATIVE mg/dL
Specific Gravity, Urine: 1.014 (ref 1.005–1.030)
pH: 6 (ref 5.0–8.0)

## 2022-01-07 LAB — RESP PANEL BY RT-PCR (FLU A&B, COVID) ARPGX2
Influenza A by PCR: NEGATIVE
Influenza B by PCR: NEGATIVE
SARS Coronavirus 2 by RT PCR: NEGATIVE

## 2022-01-07 LAB — GLUCOSE, CAPILLARY: Glucose-Capillary: 74 mg/dL (ref 70–99)

## 2022-01-07 LAB — LIPASE, BLOOD: Lipase: 25 U/L (ref 11–51)

## 2022-01-07 MED ORDER — GUAIFENESIN ER 600 MG PO TB12: 600.0000 mg | ORAL_TABLET | Freq: Two times a day (BID) | ORAL | 0 refills | Status: AC

## 2022-01-07 MED ORDER — IPRATROPIUM-ALBUTEROL 0.5-2.5 (3) MG/3ML IN SOLN
3.0000 mL | RESPIRATORY_TRACT | Status: DC
  Administered 2022-01-07 (×3): 3 mL via RESPIRATORY_TRACT
  Filled 2022-01-07: qty 9

## 2022-01-07 MED ORDER — MONTELUKAST SODIUM 10 MG PO TABS: 10.0000 mg | ORAL_TABLET | Freq: Every day | ORAL | 3 refills | Status: DC

## 2022-01-07 MED ORDER — LORATADINE 10 MG PO TABS: 10.0000 mg | ORAL_TABLET | Freq: Every day | ORAL | 0 refills | Status: DC

## 2022-01-07 MED ORDER — ALBUTEROL SULFATE HFA 108 (90 BASE) MCG/ACT IN AERS
2.0000 | INHALATION_SPRAY | RESPIRATORY_TRACT | Status: DC | PRN
  Administered 2022-01-07: 2 via RESPIRATORY_TRACT
  Filled 2022-01-07: qty 6.7

## 2022-01-07 MED ORDER — BUDESONIDE-FORMOTEROL FUMARATE 80-4.5 MCG/ACT IN AERO: 2.0000 | INHALATION_SPRAY | Freq: Two times a day (BID) | RESPIRATORY_TRACT | 12 refills | Status: DC | PRN

## 2022-01-07 MED ORDER — PREDNISONE 20 MG PO TABS: 40.0000 mg | ORAL_TABLET | Freq: Every day | ORAL | 0 refills | Status: AC

## 2022-01-07 NOTE — Progress Notes (Signed)
Pt says she cannot breathe when she goes outside. EMS came to her home last night because of wheezing, assessed her and sent her back into the house. She isnt using her albuterol inhaler because she says it doesn't work.

## 2022-01-07 NOTE — Progress Notes (Signed)
Called to MAU for breathing treatment. Pt could be heard wheezing at the door. SAO2 is 96 on RA, given 3 treatments for decreased BS.HR stable throughout. RN notified.

## 2022-01-07 NOTE — Progress Notes (Signed)
Patient presents today for BG check, coughing and wheezing. Pt BG 74. Pt states she has not eaten anything today. RN educated patient in how to use diabetic supplies. BG Log given. Pt verbalized understanding. Pt given lunch from food market. Ivonne Andrew, CNM in room to assess pt coughing and wheezing. Pt prescribed albuterol inhaler but states it does not work and she does not use it. Pt vital signs are stable and pt O2 100%. Ivonne Andrew CNM advised pt be assess at the MAU. Pt verbalized understanding and agreeable with plan of care. Oncologist given.

## 2022-01-07 NOTE — Discharge Instructions (Signed)

## 2022-01-07 NOTE — Addendum Note (Signed)
Addended by: Dorathy Kinsman on: 01/07/2022 12:39 PM   Modules accepted: Level of Service

## 2022-01-07 NOTE — MAU Note (Signed)
Alyssa Hartman is a 26 y.o. at [redacted]w[redacted]d here in MAU reporting: last night had an asthma attack. Called EMS to the house, states they did not do a breathing treatment. Used her albuterol inhaler last night. Feels like she is suffocating when she goes outside. Upper back pain and SOB. No labor s/s. +FM  Onset of complaint: last night  Pain score: 3/10  Vitals:   01/07/22 1250  BP: 130/68  Pulse: (!) 123  Resp: (!) 28  Temp: 99.1 F (37.3 C)  SpO2: 95%     FHT:148  Lab orders placed from triage: UA

## 2022-01-07 NOTE — MAU Provider Note (Signed)
History     CSN: [id number]  Arrival date and time: 01/07/22 1238   Event Date/Time   First Provider Initiated Contact with Patient 01/07/22 1303      Chief Complaint  Patient presents with   Wheezing   Back Pain   Shortness of Breath   HPI Alyssa Hartman is a 26 y.o. G4P3003 at 36w1dwho [facility]2 [facility] of Asthma. This is a recurrent problem, current acute episode started last night. Patient states last night she called EMS for evaluation of her wheezing and dry cough.  She learned that they could not give her a breathing treatment for she declined transport. Patient attempted to manage her symptoms with her Albuterol inhaler but did not experience relief. She last used her inhaler at 0830 today.   Patient denies chest pain, activity intolerance, weakness and syncope. She denies food insecurity. She is not experiencing nausea or vomiting.  She denies contractions, vomiting, DFM, fever or recent illness. She denies sick contacts.  OB History     Gravida  4   Para  3   Term  3   Preterm  0   AB      Living  3      SAB      IAB      Ectopic      Multiple  0   Live Births  3           Past Medical History:  Diagnosis Date   Abscess of right axilla    Anxiety    BV (bacterial vaginosis)    Carpal tunnel syndrome    Depression    GBS (group B Streptococcus carrier), +RV culture, currently pregnant 03/16/2018   GDM (gestational diabetes mellitus)    Gestational diabetes 02/03/2018   Current Diabetic Medications:  None   Required Referrals for A1GDM or A2GDM: _0  Diabetes Education and Testing Supplies _1  Nutrition Cousult   Baseline and [facility]  Lab Results  Component Value Date   CREATININE 0.96 05/08/2017   AST 32 05/08/2017   ALT 29 05/08/2017   TSH 3.830 11/10/2017   Lab Results  Component Value Date   HGBA1C 5.7 (   HSV-1 (herpes simplex  virus 1) infection    Hx of Hashimoto thyroiditis    Hypothyroid     Past Surgical History:  Procedure Laterality Date   NO PAST SURGERIES      Family History  Problem Relation Age of Onset   Asthma Sister    Thyroid disease Sister    Cancer Paternal Uncle    Stroke Maternal Grandmother    Diabetes Maternal Grandmother    Hypertension Maternal Grandmother    Cancer Other    Hearing loss Neg Hx     Social History   Tobacco Use   Smoking status: Former    Types: Cigarettes    Quit date: 03/18/2013    Years since quitting: 8.8   Smokeless tobacco: Never  Vaping Use   Vaping Use: Former   Substances: Nicotine  Substance Use Topics   Alcohol use: Not Currently   Drug use: Not Currently    Types: Marijuana    Comment: last used January 2023    Allergies:  Allergies  Allergen Reactions   Milk-Related Compounds Diarrhea    Medications Prior to Admission  Medication Sig Dispense Refill Last Dose   albuterol (  VENTOLIN HFA) 108 (90 Base) MCG/ACT inhaler Inhale 1-2 puffs into the lungs every 4 (four) hours as needed for wheezing or shortness of breath. 126 g 2 01/07/2022   dextromethorphan-guaiFENesin (MUCINEX DM) 30-600 MG 12hr tablet Take 1 tablet by mouth 2 (two) times daily.   01/06/2022   valACYclovir (VALTREX) 500 MG tablet Take 1 tablet (500 mg total) by mouth daily. 30 tablet 11 Past Week   aspirin EC 81 MG tablet Take 1 tablet (81 mg total) by mouth daily. Swallow whole. (Patient not taking: Reported on 12/03/2021) 30 tablet 12    Blood Glucose Monitoring Suppl (ACCU-CHEK GUIDE) w/Device KIT 1 Device by Does not apply route 4 (four) times daily. (Patient not taking: Reported on 11/04/2021) 1 kit 0    Continuous Blood Gluc Sensor (DEXCOM G6 SENSOR) MISC Change Dexcom G6 sensor every 10 days. (Patient not taking: Reported on 12/03/2021) 3 each 11    Continuous Blood Gluc Transmit (DEXCOM G6 TRANSMITTER) MISC 1 Device by Does not apply route every 3 (three) months.  (Patient not taking: Reported on 12/03/2021) 1 each 3    famotidine (PEPCID) 20 MG tablet Take 1 tablet (20 mg total) by mouth at bedtime. 30 tablet 1    levothyroxine (SYNTHROID) 125 MCG tablet Take 1 tablet (125 mcg total) by mouth daily before breakfast. (Patient not taking: Reported on 01/07/2022) 30 tablet 5    Prenatal Vit-Fe Fumarate-FA (PRENATAL VITAMIN) 27-0.8 MG TABS Take 1 tablet by mouth daily. 30 tablet 11     Review of Systems  Constitutional:  Positive for fatigue. Negative for fever.  Respiratory:  Positive for cough, shortness of breath and wheezing.   Musculoskeletal:  Positive for back pain.  All other systems reviewed and are negative.  Physical Exam   Blood pressure 130/68, pulse (!) 123, temperature 99.1 F (37.3 C), temperature source Oral, resp. rate (!) 28, height _0  (1.575 m), weight 115.7 kg, last menstrual period 06/24/2021, SpO2 100 %.  Physical Exam Vitals reviewed.  Constitutional:      Appearance: She is well-developed. She is obese. She is ill-appearing.  Cardiovascular:     Rate and Rhythm: Regular rhythm. Tachycardia present.     Pulses: Normal pulses.  Pulmonary:     Breath sounds: Examination of the right-upper field reveals wheezing. Examination of the right-middle field reveals wheezing. Wheezing present.  Skin:    Capillary Refill: Capillary refill takes less than 2 seconds.  Neurological:     Mental Status: She is alert and oriented to person, place, and time.  Psychiatric:        Mood and Affect: Mood normal.        Behavior: Behavior normal.     MAU Course  Procedures  MDM  --Reactive tracing: baseline 145, mod var, 15 x 15 accels, no decels --Toco: quiet --Wheezing resolved s/p duoneb treatment with RT --Discussed asthma action plan, new regimen to reduce exacerbations. Outpatient medications coordinated with Jorje Guild, FNP and Dr. Nehemiah Settle   Orders Placed This Encounter  Procedures   Resp Panel by RT-PCR (Flu A&B,  Covid) Anterior Nasal Swab   DG Chest Port 1 View   Urinalysis, Routine w reflex microscopic Urine, Clean Catch   CBC with Differential/Platelet   Basic metabolic panel   Lipase, blood   Airborne and Contact precautions   Pulse oximetry, continuous   Discharge patient   Patient Vitals for the past 24 hrs:  BP Temp Temp src Pulse Resp SpO2 Height Weight  01/07/22 1632 Marland Kitchen)  103/55 -- -- (!) 121 20 -- -- --  01/07/22 1630 -- -- -- -- -- 98 % -- --  01/07/22 1625 -- -- -- -- -- 97 % -- --  01/07/22 1620 -- -- -- -- -- 96 % -- --  01/07/22 1615 -- -- -- -- -- 97 % -- --  01/07/22 1610 -- -- -- -- -- 97 % -- --  01/07/22 1607 (!) 106/59 -- -- (!) 118 20 -- -- --  01/07/22 1605 -- -- -- -- -- 98 % -- --  01/07/22 1600 -- -- -- -- -- 98 % -- --  01/07/22 1555 -- -- -- -- -- 98 % -- --  01/07/22 1550 -- -- -- -- -- 96 % -- --  01/07/22 1545 -- -- -- -- -- 96 % -- --  01/07/22 1540 -- -- -- -- -- 96 % -- --  01/07/22 1535 -- -- -- -- -- 98 % -- --  01/07/22 1530 -- -- -- -- -- 100 % -- --  01/07/22 1525 -- -- -- -- -- 100 % -- --  01/07/22 1524 -- -- -- -- -- 100 % -- --  01/07/22 1520 -- -- -- -- -- 99 % -- --  01/07/22 1515 -- -- -- -- -- 97 % -- --  01/07/22 1510 -- -- -- -- -- 96 % -- --  01/07/22 1505 -- -- -- -- -- 98 % -- --  01/07/22 1500 -- -- -- -- -- 99 % -- --  01/07/22 1455 -- -- -- -- -- 98 % -- --  01/07/22 1450 -- -- -- -- -- 99 % -- --  01/07/22 1445 -- -- -- -- -- 98 % -- --  01/07/22 1440 -- -- -- -- -- 99 % -- --  01/07/22 1439 -- -- -- -- -- 99 % -- --  01/07/22 1349 -- -- -- -- -- 100 % -- --  01/07/22 1250 130/68 99.1 F (37.3 C) Oral (!) 123 (!) 28 95 % -- --  01/07/22 1244 -- -- -- -- -- -- _0  (1.575 m) 115.7 kg   Results for orders placed or performed during the hospital encounter of 01/07/22 (from the past 24 hour(s))  Urinalysis, Routine w reflex microscopic Urine, Clean Catch     Status: Abnormal   Collection Time: 01/07/22  1:12 PM  Result Value  Ref Range   Color, Urine YELLOW YELLOW   APPearance CLEAR CLEAR   Specific Gravity, Urine 1.014 1.005 - 1.030   pH 6.0 5.0 - 8.0   Glucose, UA NEGATIVE NEGATIVE mg/dL   Hgb urine dipstick NEGATIVE NEGATIVE   Bilirubin Urine NEGATIVE NEGATIVE   Ketones, ur 80 (A) NEGATIVE mg/dL   Protein, ur NEGATIVE NEGATIVE mg/dL   Nitrite NEGATIVE NEGATIVE   Leukocytes,Ua NEGATIVE NEGATIVE  Resp Panel by RT-PCR (Flu A&B, Covid) Anterior Nasal Swab     Status: None   Collection Time: 01/07/22  1:22 PM   Specimen: Anterior Nasal Swab  Result Value Ref Range   SARS Coronavirus 2 by RT PCR NEGATIVE NEGATIVE   Influenza A by PCR NEGATIVE NEGATIVE   Influenza B by PCR NEGATIVE NEGATIVE  CBC with Differential/Platelet     Status: Abnormal   Collection Time: 01/07/22  3:00 PM  Result Value Ref Range   WBC 9.3 4.0 - 10.5 K/uL   RBC 4.18 3.87 - 5.11 MIL/uL   Hemoglobin 10.6 (L) 12.0 - 15.0 g/dL   HCT 33.8 (  L) 36.0 - 46.0 %   MCV 80.9 80.0 - 100.0 fL   MCH 25.4 (L) 26.0 - 34.0 pg   MCHC 31.4 30.0 - 36.0 g/dL   RDW 15.2 11.5 - 15.5 %   Platelets 232 150 - 400 K/uL   nRBC 0.0 0.0 - 0.2 %   Neutrophils Relative % 75 %   Neutro Abs 7.0 1.7 - 7.7 K/uL   Lymphocytes Relative 13 %   Lymphs Abs 1.2 0.7 - 4.0 K/uL   Monocytes Relative 9 %   Monocytes Absolute 0.8 0.1 - 1.0 K/uL   Eosinophils Relative 3 %   Eosinophils Absolute 0.3 0.0 - 0.5 K/uL   Basophils Relative 0 %   Basophils Absolute 0.0 0.0 - 0.1 K/uL   Immature Granulocytes 0 %   Abs Immature Granulocytes 0.04 0.00 - 0.07 K/uL  Basic metabolic panel     Status: Abnormal   Collection Time: 01/07/22  3:00 PM  Result Value Ref Range   Sodium 134 (L) 135 - 145 mmol/L   Potassium 3.4 (L) 3.5 - 5.1 mmol/L   Chloride 106 98 - 111 mmol/L   CO2 20 (L) 22 - 32 mmol/L   Glucose, Bld 126 (H) 70 - 99 mg/dL   BUN <5 (L) 6 - 20 mg/dL   Creatinine, Ser 0.77 0.44 - 1.00 mg/dL   Calcium 8.6 (L) 8.9 - 10.3 mg/dL   GFR, Estimated >60 >60 mL/min   Anion gap  8 5 - 15  Lipase, blood     Status: None   Collection Time: 01/07/22  3:00 PM  Result Value Ref Range   Lipase 25 11 - 51 U/L    DG Chest Port 1 View  Result Date: 01/07/2022 CLINICAL DATA:  Shortness of breath, wheezing EXAM: PORTABLE CHEST 1 VIEW COMPARISON:  11/18/2021 FINDINGS: There is poor inspiration. There are no signs of pulmonary edema or focal pulmonary consolidation. There is no pleural effusion or pneumothorax. IMPRESSION: No active disease. Electronically Signed   By: Elmer Picker M.D.   On: 01/07/2022 13:55    Meds ordered this encounter  Medications   albuterol (VENTOLIN HFA) 108 (90 Base) MCG/ACT inhaler 2 puff   ipratropium-albuterol (DUONEB) 0.5-2.5 (3) MG/3ML nebulizer solution 3 mL   montelukast (SINGULAIR) 10 MG tablet    Sig: Take 1 tablet (10 mg total) by mouth at bedtime.    Dispense:  30 tablet    Refill:  3    Order Specific Question:   Supervising Provider    Answer:   CONSTANT, PEGGY [[id number]]   loratadine (CLARITIN) 10 MG tablet    Sig: Take 1 tablet (10 mg total) by mouth daily.    Dispense:  30 tablet    Refill:  0    Order Specific Question:   Supervising Provider    Answer:   CONSTANT, PEGGY [[id number]]   budesonide-formoterol (SYMBICORT) 80-4.5 MCG/ACT inhaler    Sig: Inhale 2 puffs into the lungs 2 (two) times daily as needed for up to 7 days.    Dispense:  1 each    Refill:  12    Order Specific Question:   Supervising Provider    Answer:   CONSTANT, PEGGY [[id number]]   predniSONE (DELTASONE) 20 MG tablet    Sig: Take 2 tablets (40 mg total) by mouth daily with breakfast for 7 days.    Dispense:  14 tablet    Refill:  0    Order Specific Question:   Supervising Provider  Answer:   CONSTANT, PEGGY [[id number]]   guaiFENesin (MUCINEX) 600 MG 12 hr tablet    Sig: Take 1 tablet (600 mg total) by mouth 2 (two) times daily for 7 days.    Dispense:  14 tablet    Refill:  0    Order Specific Question:   Supervising Provider    Answer:   CONSTANT, PEGGY  [[id number]]   Assessment and Plan  --26 y.o. [id number] at [redacted]w[redacted]d --Asthma exacerbation --Reactive tracing --Initiate new regimen for asthma management --Lungs CTAB prior to discharge --Discharge home in stable condition  SDarlina Rumpf MBlaine MSN, CNM 01/07/2022, 5:21 PM

## 2022-01-08 ENCOUNTER — Other Ambulatory Visit: Payer: Self-pay | Admitting: *Deleted

## 2022-01-08 DIAGNOSIS — O99213 Obesity complicating pregnancy, third trimester: Secondary | ICD-10-CM

## 2022-01-08 DIAGNOSIS — O24419 Gestational diabetes mellitus in pregnancy, unspecified control: Secondary | ICD-10-CM

## 2022-01-08 DIAGNOSIS — E079 Disorder of thyroid, unspecified: Secondary | ICD-10-CM

## 2022-01-08 DIAGNOSIS — R638 Other symptoms and signs concerning food and fluid intake: Secondary | ICD-10-CM

## 2022-01-21 ENCOUNTER — Other Ambulatory Visit: Payer: Medicaid Other

## 2022-01-21 ENCOUNTER — Encounter: Payer: Medicaid Other | Admitting: Family Medicine

## 2022-01-27 ENCOUNTER — Other Ambulatory Visit: Payer: Self-pay | Admitting: *Deleted

## 2022-01-27 MED ORDER — VALACYCLOVIR HCL 500 MG PO TABS: 1000.0000 mg | ORAL_TABLET | Freq: Two times a day (BID) | ORAL | 0 refills | Status: AC

## 2022-01-28 ENCOUNTER — Encounter: Payer: Self-pay | Admitting: *Deleted

## 2022-01-31 ENCOUNTER — Ambulatory Visit: Payer: Medicaid Other

## 2022-01-31 ENCOUNTER — Telehealth: Payer: Self-pay | Admitting: *Deleted

## 2022-01-31 NOTE — Telephone Encounter (Signed)
Patient requesting RX for inhalers be sent to a different pharmacy . I called her pharmacy( [facility] - [place]) to clarify if any refills available. They clarified she does have refills left. I will send a message to patient she may tranfer the refills. Nancy Fetter

## 2022-02-03 ENCOUNTER — Other Ambulatory Visit: Payer: Self-pay | Admitting: *Deleted

## 2022-02-04 ENCOUNTER — Encounter: Payer: Self-pay | Admitting: *Deleted

## 2022-02-04 ENCOUNTER — Ambulatory Visit: Payer: Medicaid Other | Admitting: *Deleted

## 2022-02-04 ENCOUNTER — Ambulatory Visit: Payer: Medicaid Other | Attending: Maternal & Fetal Medicine

## 2022-02-04 VITALS — BP 110/51 | HR 88

## 2022-02-04 DIAGNOSIS — O24419 Gestational diabetes mellitus in pregnancy, unspecified control: Secondary | ICD-10-CM | POA: Insufficient documentation

## 2022-02-04 DIAGNOSIS — Z658 Other specified problems related to psychosocial circumstances: Secondary | ICD-10-CM | POA: Insufficient documentation

## 2022-02-04 DIAGNOSIS — O99280 Endocrine, nutritional and metabolic diseases complicating pregnancy, unspecified trimester: Secondary | ICD-10-CM | POA: Diagnosis not present

## 2022-02-04 DIAGNOSIS — Z8489 Family history of other specified conditions: Secondary | ICD-10-CM

## 2022-02-04 DIAGNOSIS — O24312 Unspecified pre-existing diabetes mellitus in pregnancy, second trimester: Secondary | ICD-10-CM | POA: Insufficient documentation

## 2022-02-04 DIAGNOSIS — E079 Disorder of thyroid, unspecified: Secondary | ICD-10-CM | POA: Diagnosis not present

## 2022-02-04 DIAGNOSIS — O24410 Gestational diabetes mellitus in pregnancy, diet controlled: Secondary | ICD-10-CM

## 2022-02-04 DIAGNOSIS — O99213 Obesity complicating pregnancy, third trimester: Secondary | ICD-10-CM | POA: Insufficient documentation

## 2022-02-04 DIAGNOSIS — R8271 Bacteriuria: Secondary | ICD-10-CM

## 2022-02-04 DIAGNOSIS — D563 Thalassemia minor: Secondary | ICD-10-CM

## 2022-02-04 DIAGNOSIS — O0990 Supervision of high risk pregnancy, unspecified, unspecified trimester: Secondary | ICD-10-CM

## 2022-02-04 DIAGNOSIS — E669 Obesity, unspecified: Secondary | ICD-10-CM

## 2022-02-04 DIAGNOSIS — R638 Other symptoms and signs concerning food and fluid intake: Secondary | ICD-10-CM | POA: Insufficient documentation

## 2022-02-04 DIAGNOSIS — Z3A32 32 weeks gestation of pregnancy: Secondary | ICD-10-CM

## 2022-02-04 DIAGNOSIS — O285 Abnormal chromosomal and genetic finding on antenatal screening of mother: Secondary | ICD-10-CM

## 2022-02-05 ENCOUNTER — Encounter: Payer: Self-pay | Admitting: Family Medicine

## 2022-02-05 ENCOUNTER — Other Ambulatory Visit: Payer: Self-pay

## 2022-02-05 ENCOUNTER — Ambulatory Visit (INDEPENDENT_AMBULATORY_CARE_PROVIDER_SITE_OTHER): Payer: Medicaid Other | Admitting: Family Medicine

## 2022-02-05 VITALS — BP 105/72 | HR 98 | Wt 256.6 lb

## 2022-02-05 DIAGNOSIS — O0990 Supervision of high risk pregnancy, unspecified, unspecified trimester: Secondary | ICD-10-CM

## 2022-02-05 DIAGNOSIS — E063 Autoimmune thyroiditis: Secondary | ICD-10-CM

## 2022-02-05 DIAGNOSIS — Z3009 Encounter for other general counseling and advice on contraception: Secondary | ICD-10-CM

## 2022-02-05 DIAGNOSIS — R768 Other specified abnormal immunological findings in serum: Secondary | ICD-10-CM

## 2022-02-05 DIAGNOSIS — Z658 Other specified problems related to psychosocial circumstances: Secondary | ICD-10-CM

## 2022-02-05 DIAGNOSIS — O0993 Supervision of high risk pregnancy, unspecified, third trimester: Secondary | ICD-10-CM

## 2022-02-05 DIAGNOSIS — O24312 Unspecified pre-existing diabetes mellitus in pregnancy, second trimester: Secondary | ICD-10-CM

## 2022-02-05 DIAGNOSIS — O24313 Unspecified pre-existing diabetes mellitus in pregnancy, third trimester: Secondary | ICD-10-CM

## 2022-02-05 DIAGNOSIS — Z3A32 32 weeks gestation of pregnancy: Secondary | ICD-10-CM

## 2022-02-05 DIAGNOSIS — J45909 Unspecified asthma, uncomplicated: Secondary | ICD-10-CM

## 2022-02-05 DIAGNOSIS — F319 Bipolar disorder, unspecified: Secondary | ICD-10-CM

## 2022-02-05 DIAGNOSIS — E039 Hypothyroidism, unspecified: Secondary | ICD-10-CM

## 2022-02-05 DIAGNOSIS — O99513 Diseases of the respiratory system complicating pregnancy, third trimester: Secondary | ICD-10-CM

## 2022-02-05 DIAGNOSIS — R8271 Bacteriuria: Secondary | ICD-10-CM

## 2022-02-05 NOTE — Patient Instructions (Signed)

## 2022-02-05 NOTE — Progress Notes (Signed)
   Subjective:  Alyssa Hartman is a 26 y.o. [phone] at [redacted]w[redacted]d being seen today for ongoing prenatal care.  She is currently monitored for the following issues for this high-risk pregnancy and has Bipolar 1 disorder (HCC); Hashimoto's thyroiditis; Obesity; Biological false positive RPR test; History of genetic disease; Unwanted fertility; Supervision of high risk pregnancy, antepartum; Psychosocial stressors; GBS bacteriuria; Preexisting diabetes complicating pregnancy in second trimester, antepartum; and Asthma affecting pregnancy in third trimester on their problem list.  Patient reports no complaints.  Contractions: Irritability. Vag. Bleeding: None.  Movement: Present. Denies leaking of fluid.   The following portions of the patient's history were reviewed and updated as appropriate: allergies, current medications, past family history, past medical history, past social history, past surgical history and problem list. Problem list updated.  Objective:   Vitals:   02/05/22 0944  BP: 105/72  Pulse: 98  Weight: 256 lb 9.6 oz (116.4 kg)    Fetal Status: Fetal Heart Rate (bpm): 130   Movement: Present     General:  Alert, oriented and cooperative. Patient is in no acute distress.  Skin: Skin is warm and dry. No rash noted.   Cardiovascular: Normal heart rate noted  Respiratory: Normal respiratory effort, no problems with respiration noted  Abdomen: Soft, gravid, appropriate for gestational age. Pain/Pressure: Present     Pelvic: Vag. Bleeding: None     Cervical exam deferred        Extremities: Normal range of motion.     Mental Status: Normal mood and affect. Normal behavior. Normal judgment and thought content.   Urinalysis:      Assessment and Plan:  Pregnancy: G4P3003 at [redacted]w[redacted]d  1. Supervision of high risk pregnancy, antepartum BP and FHR normal Has not had third trimester RPR/HIV, drawn today Had CBC in MAU recently which was unremarkable  2. Preexisting diabetes  complicating pregnancy in second trimester, antepartum A1c 5.8% and failed early 2 hr GTT, not actually T2DM, fetal echo not indicated GDMA1 Growth scans have been normal, on 02/04/2022 EFW 62%, AC 81%, AFI 18 Glucometer thrown out (seen problem 6) Will see again about getting her a CGM  3. Asthma affecting pregnancy in third trimester Using albuterol nightly Steroid inhaler was thrown out (see problem 6)  4. Hashimoto's thyroiditis On synthyroid 125 mcg/day Draw TSH today, last done two months prior and free T4 was good  5. Unwanted fertility Signed BTL papers on 01/27/2022  6. Psychosocial stressors Living at room [place] the inn Was staying in an abandoned apartment but was discovered and kicked out, all her medicines were thrown out as well  7. GBS bacteriuria Ppx in labor  8. Bipolar 1 disorder (HCC) Has not been to [facility] yet, encouraged to do so  9. Biological false positive RPR test Neg Tpall Ab's Redraw RPR today per state law  Preterm labor symptoms and general obstetric precautions including but not limited to vaginal bleeding, contractions, leaking of fluid and fetal movement were reviewed in detail with the patient. Please refer to After Visit Summary for other counseling recommendations.  Return in 2 weeks (on 02/19/2022) for [facility], ob visit, needs MD.   Venora Maples, MD

## 2022-02-06 LAB — TSH+FREE T4
Free T4: 1.08 ng/dL (ref 0.82–1.77)
TSH: 3.19 u[IU]/mL (ref 0.450–4.500)

## 2022-02-06 LAB — HIV ANTIBODY (ROUTINE TESTING W REFLEX): HIV Screen 4th Generation wRfx: NONREACTIVE

## 2022-02-06 LAB — RPR W/REFLEX TO TREPSURE: RPR: REACTIVE — AB

## 2022-02-06 LAB — RPR, QUANT: RPR, Quant: 1:2 {titer} — ABNORMAL HIGH

## 2022-02-06 MED ORDER — LEVOTHYROXINE SODIUM 137 MCG PO TABS: 137.0000 ug | ORAL_TABLET | Freq: Every day | ORAL | 1 refills | Status: AC

## 2022-02-06 NOTE — Addendum Note (Signed)
Addended by: Merian Capron on: 02/06/2022 10:04 AM   Modules accepted: Orders

## 2022-02-11 ENCOUNTER — Inpatient Hospital Stay (HOSPITAL_COMMUNITY)
Admission: AD | Admit: 2022-02-11 | Discharge: 2022-02-11 | Disposition: A | Discharge status: Home / Self Care | Payer: Medicaid Other | Attending: Obstetrics & Gynecology | Admitting: Obstetrics & Gynecology

## 2022-02-11 ENCOUNTER — Other Ambulatory Visit: Payer: Self-pay

## 2022-02-11 ENCOUNTER — Telehealth: Payer: Self-pay

## 2022-02-11 ENCOUNTER — Encounter (HOSPITAL_COMMUNITY): Payer: Self-pay | Admitting: Obstetrics & Gynecology

## 2022-02-11 DIAGNOSIS — Z3A33 33 weeks gestation of pregnancy: Secondary | ICD-10-CM

## 2022-02-11 DIAGNOSIS — N898 Other specified noninflammatory disorders of vagina: Secondary | ICD-10-CM | POA: Diagnosis present

## 2022-02-11 DIAGNOSIS — O26893 Other specified pregnancy related conditions, third trimester: Secondary | ICD-10-CM | POA: Diagnosis present

## 2022-02-11 DIAGNOSIS — O4703 False labor before 37 completed weeks of gestation, third trimester: Secondary | ICD-10-CM

## 2022-02-11 DIAGNOSIS — M545 Low back pain, unspecified: Secondary | ICD-10-CM

## 2022-02-11 DIAGNOSIS — R102 Pelvic and perineal pain: Secondary | ICD-10-CM | POA: Diagnosis present

## 2022-02-11 DIAGNOSIS — O4700 False labor before 37 completed weeks of gestation, unspecified trimester: Secondary | ICD-10-CM

## 2022-02-11 DIAGNOSIS — O479 False labor, unspecified: Secondary | ICD-10-CM

## 2022-02-11 LAB — WET PREP, GENITAL
Clue Cells Wet Prep HPF POC: NONE SEEN
Sperm: NONE SEEN
Trich, Wet Prep: NONE SEEN
WBC, Wet Prep HPF POC: >=10 — AB (ref <10)
Yeast Wet Prep HPF POC: NONE SEEN

## 2022-02-11 LAB — URINALYSIS, ROUTINE W REFLEX MICROSCOPIC
Bilirubin Urine: NEGATIVE
Glucose, UA: NEGATIVE mg/dL
Hgb urine dipstick: NEGATIVE
Ketones, ur: NEGATIVE mg/dL
Leukocytes,Ua: NEGATIVE
Nitrite: NEGATIVE
Protein, ur: NEGATIVE mg/dL
Specific Gravity, Urine: 1.019 (ref 1.005–1.030)
pH: 6 (ref 5.0–8.0)

## 2022-02-11 MED ORDER — ACETAMINOPHEN 500 MG PO TABS
1000.0000 mg | ORAL_TABLET | Freq: Once | ORAL | Status: AC
  Administered 2022-02-11: 1000 mg via ORAL
  Filled 2022-02-11: qty 2

## 2022-02-11 MED ORDER — ACETAMINOPHEN 500 MG PO TABS
ORAL_TABLET | ORAL | Status: AC
  Filled 2022-02-11: qty 1

## 2022-02-11 MED ORDER — NIFEDIPINE 10 MG PO CAPS
10.0000 mg | ORAL_CAPSULE | ORAL | Status: AC | PRN
  Administered 2022-02-11 (×3): 10 mg via ORAL
  Filled 2022-02-11 (×3): qty 1

## 2022-02-11 MED ORDER — CYCLOBENZAPRINE HCL 5 MG PO TABS
10.0000 mg | ORAL_TABLET | Freq: Once | ORAL | Status: AC
  Administered 2022-02-11: 10 mg via ORAL
  Filled 2022-02-11: qty 2

## 2022-02-11 NOTE — MAU Note (Signed)
Alyssa Hartman is a 26 y.o. at [redacted]w[redacted]d here in MAU reporting: vaginal bleeding/vaginal discharge. Denies any odor or itching. States the discharge was brown. States there was red/pinkish/brown bleeding when she wiped and in the toilet around 1530 today. States she has contractions occasionally and has pelvic pain/pressure all the time. States "it feels like a bowling ball between my legs". Denies any recent sexual activity. Denies any urinary burning but it feels like she doesn't empty her bladder all the way. Denies any LOF and has had +FM.    Onset of complaint: 1530 Pain score: 4- low back pain  Vitals:   02/11/22 1703  BP: 129/70  Pulse: 97  Resp: 18  Temp: 98.1 F (36.7 C)     FHT:[id number]

## 2022-02-11 NOTE — Telephone Encounter (Signed)
Per chart review pt has checked into MAU.

## 2022-02-11 NOTE — MAU Provider Note (Signed)
History     CSN: [id number]  Arrival date and time: 02/11/22 1640   Event Date/Time   First Provider Initiated Contact with Patient 02/11/22 1801      Chief Complaint  Patient presents with  . Vaginal Bleeding  . Vaginal Discharge  . Pelvic Pain   Alyssa Hartman , a  26 y.o. [phone] at 38w2dpresents to MAU with complaints of Pinkish red brown vaginal bleeding that started at 330pm. Patient reports Only one occurrence. Last intercourse in October. She also endorses Yellow vaginal discharge. She Denies itching and burning vaginal and urinary symptoms.   She also reports on-going back pain and "period cramping in the front." Patient stays that the tops of her "thighs cramps with movement." She states that she has been having braxton hicks contractions and that's "what that feels like." Endorses positive fetal movement. Currently denies pain. Earlier back pain 4/10. Currently 0/10.    OB History     Gravida  4   Para  3   Term  3   Preterm  0   AB      Living  3      SAB      IAB      Ectopic      Multiple  0   Live Births  3           Past Medical History:  Diagnosis Date  . Abscess of right axilla   . Anxiety   . BV (bacterial vaginosis)   . Carpal tunnel syndrome   . Depression   . GBS (group B Streptococcus carrier), +RV culture, currently pregnant 03/16/2018  . GDM (gestational diabetes mellitus)   . Gestational diabetes 02/03/2018   Current Diabetic Medications:  None   Required Referrals for A1GDM or A2GDM: _0  Diabetes Education and Testing Supplies _1  Nutrition Cousult   Baseline and surveillance labs (pulled in [facility] refresh links as needed)  Lab Results  Component Value Date   CREATININE 0.96 05/08/2017   AST 32 05/08/2017   ALT 29 05/08/2017   TSH 3.830 11/10/2017   Lab Results  Component Value Date   HGBA1C 5.7 (  . HSV-1 (herpes simplex virus 1) infection   . Hx of Hashimoto thyroiditis   . Hypothyroid     Past Surgical History:   Procedure Laterality Date  . NO PAST SURGERIES      Family History  Problem Relation Age of Onset  . Asthma Sister   . Thyroid disease Sister   . Cancer Paternal Uncle   . Stroke Maternal Grandmother   . Diabetes Maternal Grandmother   . Hypertension Maternal Grandmother   . Cancer Other   . Hearing loss Neg Hx     Social History   Tobacco Use  . Smoking status: Former    Types: Cigarettes    Quit date: 03/18/2013    Years since quitting: 8.9  . Smokeless tobacco: Never  Vaping Use  . Vaping Use: Former  . Substances: Nicotine  Substance Use Topics  . Alcohol use: Not Currently  . Drug use: Not Currently    Types: Marijuana    Comment: last used January 2023    Allergies:  Allergies  Allergen Reactions  . Milk-Related Compounds Diarrhea    Medications Prior to Admission  Medication Sig Dispense Refill Last Dose  . albuterol (VENTOLIN HFA) 108 (90 Base) MCG/ACT inhaler Inhale 1-2 puffs into the lungs every 4 (four) hours as needed for wheezing  or shortness of breath. 126 g 2 02/10/2022  . levothyroxine (SYNTHROID) 137 MCG tablet Take 1 tablet (137 mcg total) by mouth daily before breakfast. 90 tablet 1 02/11/2022  . Prenatal Vit-Fe Fumarate-FA (PRENATAL VITAMIN) 27-0.8 MG TABS Take 1 tablet by mouth daily. 30 tablet 11 02/11/2022  . valACYclovir (VALTREX) 500 MG tablet Take 1 tablet (500 mg total) by mouth daily. 30 tablet 11 02/11/2022  . aspirin EC 81 MG tablet Take 1 tablet (81 mg total) by mouth daily. Swallow whole. (Patient not taking: Reported on 02/05/2022) 30 tablet 12   . Blood Glucose Monitoring Suppl (ACCU-CHEK GUIDE) w/Device KIT 1 Device by Does not apply route 4 (four) times daily. (Patient not taking: Reported on 11/04/2021) 1 kit 0   . Continuous Blood Gluc Sensor (DEXCOM G6 SENSOR) MISC Change Dexcom G6 sensor every 10 days. (Patient not taking: Reported on 12/03/2021) 3 each 11   . Continuous Blood Gluc Transmit (DEXCOM G6 TRANSMITTER) MISC 1 Device  by Does not apply route every 3 (three) months. (Patient not taking: Reported on 12/03/2021) 1 each 3   . famotidine (PEPCID) 20 MG tablet Take 1 tablet (20 mg total) by mouth at bedtime. (Patient not taking: Reported on 02/05/2022) 30 tablet 1   . loratadine (CLARITIN) 10 MG tablet Take 1 tablet (10 mg total) by mouth daily. (Patient not taking: Reported on 02/05/2022) 30 tablet 0   . montelukast (SINGULAIR) 10 MG tablet Take 1 tablet (10 mg total) by mouth at bedtime. (Patient not taking: Reported on 02/05/2022) 30 tablet 3     Review of Systems  Constitutional:  Negative for chills, fatigue and fever.  Eyes:  Negative for pain and visual disturbance.  Respiratory:  Negative for apnea, shortness of breath and wheezing.   Cardiovascular:  Negative for chest pain and palpitations.  Gastrointestinal:  Positive for abdominal pain. Negative for constipation, diarrhea, nausea and vomiting.  Genitourinary:  Positive for vaginal pain. Negative for difficulty urinating, dysuria, pelvic pain, vaginal bleeding and vaginal discharge.  Musculoskeletal:  Positive for back pain.  Neurological:  Negative for seizures, weakness and headaches.  Psychiatric/Behavioral:  Negative for suicidal ideas.    Physical Exam   Blood pressure 122/72, pulse 93, temperature 98.1 F (36.7 C), temperature source Oral, resp. rate 18, last menstrual period 06/24/2021, SpO2 100 %.  Physical Exam Vitals and nursing note reviewed. Exam conducted with a chaperone present.  Constitutional:      General: She is not in acute distress.    Appearance: Normal appearance.  HENT:     Head: Normocephalic.  Pulmonary:     Effort: Pulmonary effort is normal.  Genitourinary:    General: Normal vulva.     Vagina: Vaginal discharge present.  Musculoskeletal:        General: Normal range of motion.     Cervical back: Normal range of motion.  Skin:    General: Skin is warm and dry.     Capillary Refill: Capillary refill takes less  than 2 seconds.  Neurological:     Mental Status: She is alert and oriented to person, place, and time.  Psychiatric:        Mood and Affect: Mood normal.   150 bpm with moderate variability and 15x15 accels no decels . - Toco UC noted. (Patient unaware)   Dilation: 1.5 Effacement (%): Thick Station: -3 Presentation: Vertex Exam by:: Huntley Dec CNM  MAU Course  Procedures Orders Placed This Encounter  Procedures  . Urinalysis, Routine w  reflex microscopic Urine, Clean Catch   Results for orders placed or performed during the hospital encounter of 02/11/22 (from the past 48 hour(s))  Urinalysis, Routine w reflex microscopic Urine, Clean Catch     Status: Abnormal   Collection Time: 02/11/22  5:10 PM  Result Value Ref Range   Color, Urine YELLOW YELLOW   APPearance HAZY (A) CLEAR   Specific Gravity, Urine 1.019 1.005 - 1.030   pH 6.0 5.0 - 8.0   Glucose, UA NEGATIVE NEGATIVE mg/dL   Hgb urine dipstick NEGATIVE NEGATIVE   Bilirubin Urine NEGATIVE NEGATIVE   Ketones, ur NEGATIVE NEGATIVE mg/dL   Protein, ur NEGATIVE NEGATIVE mg/dL   Nitrite NEGATIVE NEGATIVE   Leukocytes,Ua NEGATIVE NEGATIVE    Comment: Performed at [facility] [address] [place], [place] [address]  Wet prep, genital     Status: Abnormal   Collection Time: 02/11/22  6:22 PM   Specimen: Vaginal  Result Value Ref Range   Yeast Wet Prep HPF POC NONE SEEN NONE SEEN   Trich, Wet Prep NONE SEEN NONE SEEN   Clue Cells Wet Prep HPF POC NONE SEEN NONE SEEN   WBC, Wet Prep HPF POC >=10 (A) <10   Sperm NONE SEEN     Comment: Performed at [facility], [place] [address] [place], [place] [address]  GC/Chlamydia probe amp ([place])not at [facility]     Status: None   Collection Time: 02/11/22  6:26 PM  Result Value Ref Range   Neisseria Gonorrhea Negative    Chlamydia Negative    Comment Normal Reference Ranger Chlamydia - Negative    Comment      Normal Reference Range Neisseria Gonorrhea - Negative      MDM   Assessment and Plan  ***  Jacquiline Doe 02/11/2022, 6:01 PM

## 2022-02-11 NOTE — Telephone Encounter (Signed)
Received message from front office that patient had called reporting vaginal bleeding and discharge. RN called patient who stated she had pink spotting and brown colored discharge starting at 1500. Pt also reported decreased fetal movement. She stated baby had big movements at 0400 that woke her up from her sleep and has felt little movement since then. Due to how late in the day it is RN advised that patient be evaluated by MAU. Pt states she has seen OB personnel at [facility] where she is staying and they told her it was only a UTI and she didn't need to be evaluated. Pt does not have ride or way to get to MAU. Pt requested that RN speak to staff to give recommendation. Phone disconnected at this time. RN attempted to call back several times receiving busy signal. Will continue to get in touch with patient.

## 2022-02-12 LAB — GC/CHLAMYDIA PROBE AMP (~~LOC~~) NOT AT ARMC
Chlamydia: NEGATIVE
Comment: NEGATIVE
Comment: NORMAL
Neisseria Gonorrhea: NEGATIVE

## 2022-02-19 ENCOUNTER — Ambulatory Visit: Payer: Medicaid Other | Admitting: *Deleted

## 2022-02-19 ENCOUNTER — Ambulatory Visit: Payer: Medicaid Other | Attending: Maternal & Fetal Medicine

## 2022-02-19 ENCOUNTER — Other Ambulatory Visit: Payer: Self-pay | Admitting: Family Medicine

## 2022-02-19 ENCOUNTER — Ambulatory Visit (INDEPENDENT_AMBULATORY_CARE_PROVIDER_SITE_OTHER): Payer: Medicaid Other | Admitting: Obstetrics & Gynecology

## 2022-02-19 ENCOUNTER — Other Ambulatory Visit: Payer: Self-pay

## 2022-02-19 VITALS — BP 117/66 | HR 99 | Wt 250.0 lb

## 2022-02-19 VITALS — BP 110/59 | HR 85

## 2022-02-19 DIAGNOSIS — Z658 Other specified problems related to psychosocial circumstances: Secondary | ICD-10-CM | POA: Diagnosis present

## 2022-02-19 DIAGNOSIS — O99283 Endocrine, nutritional and metabolic diseases complicating pregnancy, third trimester: Secondary | ICD-10-CM

## 2022-02-19 DIAGNOSIS — O0990 Supervision of high risk pregnancy, unspecified, unspecified trimester: Secondary | ICD-10-CM

## 2022-02-19 DIAGNOSIS — O24419 Gestational diabetes mellitus in pregnancy, unspecified control: Secondary | ICD-10-CM | POA: Insufficient documentation

## 2022-02-19 DIAGNOSIS — R638 Other symptoms and signs concerning food and fluid intake: Secondary | ICD-10-CM | POA: Diagnosis present

## 2022-02-19 DIAGNOSIS — O24410 Gestational diabetes mellitus in pregnancy, diet controlled: Secondary | ICD-10-CM

## 2022-02-19 DIAGNOSIS — O99891 Other specified diseases and conditions complicating pregnancy: Secondary | ICD-10-CM

## 2022-02-19 DIAGNOSIS — O99280 Endocrine, nutritional and metabolic diseases complicating pregnancy, unspecified trimester: Secondary | ICD-10-CM | POA: Insufficient documentation

## 2022-02-19 DIAGNOSIS — E669 Obesity, unspecified: Secondary | ICD-10-CM | POA: Diagnosis not present

## 2022-02-19 DIAGNOSIS — R8271 Bacteriuria: Secondary | ICD-10-CM | POA: Diagnosis present

## 2022-02-19 DIAGNOSIS — O24312 Unspecified pre-existing diabetes mellitus in pregnancy, second trimester: Secondary | ICD-10-CM | POA: Insufficient documentation

## 2022-02-19 DIAGNOSIS — O99213 Obesity complicating pregnancy, third trimester: Secondary | ICD-10-CM | POA: Insufficient documentation

## 2022-02-19 DIAGNOSIS — R768 Other specified abnormal immunological findings in serum: Secondary | ICD-10-CM

## 2022-02-19 DIAGNOSIS — E079 Disorder of thyroid, unspecified: Secondary | ICD-10-CM | POA: Insufficient documentation

## 2022-02-19 DIAGNOSIS — Z148 Genetic carrier of other disease: Secondary | ICD-10-CM

## 2022-02-19 DIAGNOSIS — O0993 Supervision of high risk pregnancy, unspecified, third trimester: Secondary | ICD-10-CM

## 2022-02-19 DIAGNOSIS — O24313 Unspecified pre-existing diabetes mellitus in pregnancy, third trimester: Secondary | ICD-10-CM

## 2022-02-19 DIAGNOSIS — O98519 Other viral diseases complicating pregnancy, unspecified trimester: Secondary | ICD-10-CM

## 2022-02-19 DIAGNOSIS — Z3A34 34 weeks gestation of pregnancy: Secondary | ICD-10-CM

## 2022-02-19 DIAGNOSIS — Z362 Encounter for other antenatal screening follow-up: Secondary | ICD-10-CM

## 2022-02-19 MED ORDER — ACCU-CHEK GUIDE ME W/DEVICE KIT: 1.0000 | PACK | Freq: Four times a day (QID) | 0 refills | Status: DC

## 2022-02-19 NOTE — Progress Notes (Signed)
   PRENATAL VISIT NOTE  Subjective:  Alyssa Hartman is a 26 y.o. [phone] at [redacted]w[redacted]d being seen today for ongoing prenatal care.  She is currently monitored for the following issues for this high-risk pregnancy and has Bipolar 1 disorder (HCC); Hashimoto's thyroiditis; Obesity; Biological false positive RPR test; History of genetic disease; Unwanted fertility; Supervision of high risk pregnancy, antepartum; Psychosocial stressors; GBS bacteriuria; Preexisting diabetes complicating pregnancy in second trimester, antepartum; and Asthma affecting pregnancy in third trimester on their problem list.  Patient reports occasional contractions.  Contractions: Irritability. Vag. Bleeding: None.  Movement: Present. Denies leaking of fluid.   The following portions of the patient's history were reviewed and updated as appropriate: allergies, current medications, past family history, past medical history, past social history, past surgical history and problem list.   Objective:   Vitals:   02/19/22 1044  BP: 117/66  Pulse: 99  Weight: 250 lb (113.4 kg)    Fetal Status: Fetal Heart Rate (bpm): 135   Movement: Present     General:  Alert, oriented and cooperative. Patient is in no acute distress.  Skin: Skin is warm and dry. No rash noted.   Cardiovascular: Normal heart rate noted  Respiratory: Normal respiratory effort, no problems with respiration noted  Abdomen: Soft, gravid, appropriate for gestational age.  Pain/Pressure: Present     Pelvic: Cervical exam deferred        Extremities: Normal range of motion.  Edema: None  Mental Status: Normal mood and affect. Normal behavior. Normal judgment and thought content.   Assessment and Plan:  Pregnancy: G4P3003 at [redacted]w[redacted]d 1. Biological false positive RPR test Negative T pal  2. Preexisting diabetes complicating pregnancy in second trimester, antepartum Needs to have a replacement monitor to resume CBG testing  3. GBS bacteriuria   4.  Supervision of high risk pregnancy, antepartum Start weekly fetal testing   Preterm labor symptoms and general obstetric precautions including but not limited to vaginal bleeding, contractions, leaking of fluid and fetal movement were reviewed in detail with the patient. Please refer to After Visit Summary for other counseling recommendations.   Return in about 2 weeks (around 03/05/2022).  Future Appointments  Date Time Provider Department Center  02/19/2022  1:30 PM [facility] NURSE [facility]  02/19/2022  1:45 PM [facility] US6 [facility]  02/26/2022  2:30 PM [facility] NURSE [facility]  02/26/2022  2:45 PM [facility] US4 [facility]  03/05/2022  2:30 PM [facility] NURSE [facility]  03/05/2022  2:45 PM [facility] US5 [facility]    Scheryl Darter, MD

## 2022-02-20 MED ORDER — VALACYCLOVIR HCL 500 MG PO TABS: 500.0000 mg | ORAL_TABLET | Freq: Every day | ORAL | 11 refills | Status: DC

## 2022-02-26 ENCOUNTER — Ambulatory Visit: Payer: Medicaid Other | Attending: Maternal & Fetal Medicine

## 2022-02-26 ENCOUNTER — Ambulatory Visit: Payer: Medicaid Other | Admitting: *Deleted

## 2022-02-26 ENCOUNTER — Other Ambulatory Visit: Payer: Self-pay | Admitting: *Deleted

## 2022-02-26 VITALS — BP 104/60 | HR 96

## 2022-02-26 DIAGNOSIS — Z362 Encounter for other antenatal screening follow-up: Secondary | ICD-10-CM | POA: Diagnosis not present

## 2022-02-26 DIAGNOSIS — E079 Disorder of thyroid, unspecified: Secondary | ICD-10-CM | POA: Insufficient documentation

## 2022-02-26 DIAGNOSIS — E669 Obesity, unspecified: Secondary | ICD-10-CM | POA: Diagnosis not present

## 2022-02-26 DIAGNOSIS — R638 Other symptoms and signs concerning food and fluid intake: Secondary | ICD-10-CM | POA: Insufficient documentation

## 2022-02-26 DIAGNOSIS — O0990 Supervision of high risk pregnancy, unspecified, unspecified trimester: Secondary | ICD-10-CM | POA: Insufficient documentation

## 2022-02-26 DIAGNOSIS — R8271 Bacteriuria: Secondary | ICD-10-CM | POA: Insufficient documentation

## 2022-02-26 DIAGNOSIS — Z658 Other specified problems related to psychosocial circumstances: Secondary | ICD-10-CM | POA: Insufficient documentation

## 2022-02-26 DIAGNOSIS — Z3A35 35 weeks gestation of pregnancy: Secondary | ICD-10-CM

## 2022-02-26 DIAGNOSIS — O99283 Endocrine, nutritional and metabolic diseases complicating pregnancy, third trimester: Secondary | ICD-10-CM

## 2022-02-26 DIAGNOSIS — O99280 Endocrine, nutritional and metabolic diseases complicating pregnancy, unspecified trimester: Secondary | ICD-10-CM | POA: Diagnosis present

## 2022-02-26 DIAGNOSIS — O99213 Obesity complicating pregnancy, third trimester: Secondary | ICD-10-CM | POA: Insufficient documentation

## 2022-02-26 DIAGNOSIS — O24419 Gestational diabetes mellitus in pregnancy, unspecified control: Secondary | ICD-10-CM

## 2022-02-26 DIAGNOSIS — O99891 Other specified diseases and conditions complicating pregnancy: Secondary | ICD-10-CM

## 2022-02-26 DIAGNOSIS — O24410 Gestational diabetes mellitus in pregnancy, diet controlled: Secondary | ICD-10-CM

## 2022-02-26 DIAGNOSIS — Z148 Genetic carrier of other disease: Secondary | ICD-10-CM

## 2022-02-26 DIAGNOSIS — O24312 Unspecified pre-existing diabetes mellitus in pregnancy, second trimester: Secondary | ICD-10-CM

## 2022-03-05 ENCOUNTER — Encounter: Payer: Self-pay | Admitting: Obstetrics and Gynecology

## 2022-03-05 ENCOUNTER — Encounter: Payer: Medicaid Other | Admitting: Obstetrics and Gynecology

## 2022-03-05 ENCOUNTER — Ambulatory Visit: Payer: Medicaid Other | Attending: Maternal & Fetal Medicine

## 2022-03-05 ENCOUNTER — Ambulatory Visit: Payer: Medicaid Other | Admitting: *Deleted

## 2022-03-05 VITALS — BP 115/78 | HR 107

## 2022-03-05 DIAGNOSIS — O24312 Unspecified pre-existing diabetes mellitus in pregnancy, second trimester: Secondary | ICD-10-CM | POA: Diagnosis present

## 2022-03-05 DIAGNOSIS — O99280 Endocrine, nutritional and metabolic diseases complicating pregnancy, unspecified trimester: Secondary | ICD-10-CM | POA: Diagnosis present

## 2022-03-05 DIAGNOSIS — O0990 Supervision of high risk pregnancy, unspecified, unspecified trimester: Secondary | ICD-10-CM

## 2022-03-05 DIAGNOSIS — O24410 Gestational diabetes mellitus in pregnancy, diet controlled: Secondary | ICD-10-CM

## 2022-03-05 DIAGNOSIS — Z658 Other specified problems related to psychosocial circumstances: Secondary | ICD-10-CM | POA: Insufficient documentation

## 2022-03-05 DIAGNOSIS — R8271 Bacteriuria: Secondary | ICD-10-CM | POA: Diagnosis present

## 2022-03-05 DIAGNOSIS — Z8279 Family history of other congenital malformations, deformations and chromosomal abnormalities: Secondary | ICD-10-CM

## 2022-03-05 DIAGNOSIS — O99213 Obesity complicating pregnancy, third trimester: Secondary | ICD-10-CM | POA: Diagnosis present

## 2022-03-05 DIAGNOSIS — E063 Autoimmune thyroiditis: Secondary | ICD-10-CM | POA: Diagnosis not present

## 2022-03-05 DIAGNOSIS — O285 Abnormal chromosomal and genetic finding on antenatal screening of mother: Secondary | ICD-10-CM

## 2022-03-05 DIAGNOSIS — D563 Thalassemia minor: Secondary | ICD-10-CM

## 2022-03-05 DIAGNOSIS — E079 Disorder of thyroid, unspecified: Secondary | ICD-10-CM | POA: Insufficient documentation

## 2022-03-05 DIAGNOSIS — O24419 Gestational diabetes mellitus in pregnancy, unspecified control: Secondary | ICD-10-CM | POA: Diagnosis present

## 2022-03-05 DIAGNOSIS — O09293 Supervision of pregnancy with other poor reproductive or obstetric history, third trimester: Secondary | ICD-10-CM | POA: Insufficient documentation

## 2022-03-05 DIAGNOSIS — Z91199 Patient's noncompliance with other medical treatment and regimen due to unspecified reason: Secondary | ICD-10-CM | POA: Insufficient documentation

## 2022-03-05 DIAGNOSIS — O99283 Endocrine, nutritional and metabolic diseases complicating pregnancy, third trimester: Secondary | ICD-10-CM

## 2022-03-05 DIAGNOSIS — E669 Obesity, unspecified: Secondary | ICD-10-CM

## 2022-03-05 DIAGNOSIS — R638 Other symptoms and signs concerning food and fluid intake: Secondary | ICD-10-CM | POA: Diagnosis not present

## 2022-03-05 DIAGNOSIS — Z3A36 36 weeks gestation of pregnancy: Secondary | ICD-10-CM

## 2022-03-05 DIAGNOSIS — Z8619 Personal history of other infectious and parasitic diseases: Secondary | ICD-10-CM | POA: Insufficient documentation

## 2022-03-06 ENCOUNTER — Ambulatory Visit (INDEPENDENT_AMBULATORY_CARE_PROVIDER_SITE_OTHER): Payer: Medicaid Other | Admitting: Family Medicine

## 2022-03-06 ENCOUNTER — Other Ambulatory Visit: Payer: Self-pay

## 2022-03-06 ENCOUNTER — Other Ambulatory Visit (HOSPITAL_COMMUNITY)
Admission: RE | Admit: 2022-03-06 | Discharge: 2022-03-06 | Disposition: A | Discharge status: Home / Self Care | Payer: Medicaid Other | Source: Ambulatory Visit | Attending: Obstetrics and Gynecology | Admitting: Obstetrics and Gynecology

## 2022-03-06 ENCOUNTER — Encounter: Payer: Self-pay | Admitting: Family Medicine

## 2022-03-06 VITALS — BP 121/74 | HR 108 | Wt 259.9 lb

## 2022-03-06 DIAGNOSIS — R7689 Other specified abnormal immunological findings in serum: Secondary | ICD-10-CM

## 2022-03-06 DIAGNOSIS — E063 Autoimmune thyroiditis: Secondary | ICD-10-CM

## 2022-03-06 DIAGNOSIS — F319 Bipolar disorder, unspecified: Secondary | ICD-10-CM

## 2022-03-06 DIAGNOSIS — O24312 Unspecified pre-existing diabetes mellitus in pregnancy, second trimester: Secondary | ICD-10-CM

## 2022-03-06 DIAGNOSIS — J45909 Unspecified asthma, uncomplicated: Secondary | ICD-10-CM

## 2022-03-06 DIAGNOSIS — O0990 Supervision of high risk pregnancy, unspecified, unspecified trimester: Secondary | ICD-10-CM | POA: Insufficient documentation

## 2022-03-06 DIAGNOSIS — R8271 Bacteriuria: Secondary | ICD-10-CM

## 2022-03-06 DIAGNOSIS — Z6841 Body Mass Index (BMI) 40.0 and over, adult: Secondary | ICD-10-CM

## 2022-03-06 DIAGNOSIS — K219 Gastro-esophageal reflux disease without esophagitis: Secondary | ICD-10-CM

## 2022-03-06 DIAGNOSIS — O09293 Supervision of pregnancy with other poor reproductive or obstetric history, third trimester: Secondary | ICD-10-CM

## 2022-03-06 DIAGNOSIS — O99513 Diseases of the respiratory system complicating pregnancy, third trimester: Secondary | ICD-10-CM

## 2022-03-06 DIAGNOSIS — Z8619 Personal history of other infectious and parasitic diseases: Secondary | ICD-10-CM

## 2022-03-06 DIAGNOSIS — O99619 Diseases of the digestive system complicating pregnancy, unspecified trimester: Secondary | ICD-10-CM

## 2022-03-06 DIAGNOSIS — Z3009 Encounter for other general counseling and advice on contraception: Secondary | ICD-10-CM

## 2022-03-06 DIAGNOSIS — Z658 Other specified problems related to psychosocial circumstances: Secondary | ICD-10-CM

## 2022-03-06 DIAGNOSIS — E66813 Obesity, class 3: Secondary | ICD-10-CM

## 2022-03-06 DIAGNOSIS — Z3A36 36 weeks gestation of pregnancy: Secondary | ICD-10-CM

## 2022-03-06 DIAGNOSIS — R768 Other specified abnormal immunological findings in serum: Secondary | ICD-10-CM

## 2022-03-06 MED ORDER — PANTOPRAZOLE SODIUM 40 MG PO TBEC: 40.0000 mg | DELAYED_RELEASE_TABLET | Freq: Two times a day (BID) | ORAL | 2 refills | Status: AC

## 2022-03-06 NOTE — Patient Instructions (Signed)

## 2022-03-06 NOTE — Progress Notes (Signed)
Subjective:  Alyssa Hartman is a 27 y.o. [phone] at [redacted]w[redacted]d being seen today for ongoing prenatal care.  She is currently monitored for the following issues for this high-risk pregnancy and has Bipolar 1 disorder ([place]); Hashimoto's thyroiditis; Obesity; Biological false positive RPR test; History of genetic disease; Unwanted fertility; Supervision of high risk pregnancy, antepartum; Psychosocial stressors; GBS bacteriuria; Preexisting diabetes complicating pregnancy in second trimester, antepartum; Asthma affecting pregnancy in third trimester; History of herpes genitalis; and History of shoulder dystocia in prior pregnancy, currently pregnant in third trimester on their problem list.  Patient reports no complaints.  Contractions: Irritability. Vag. Bleeding: None.  Movement: Present. Denies leaking of fluid.   The following portions of the patient's history were reviewed and updated as appropriate: allergies, current medications, past family history, past medical history, past social history, past surgical history and problem list. Problem list updated.  Objective:   Vitals:   03/06/22 1623  BP: 121/74  Pulse: (!) 108  Weight: 259 lb 14.4 oz (117.9 kg)    Fetal Status: Fetal Heart Rate (bpm): 142   Movement: Present     General:  Alert, oriented and cooperative. Patient is in no acute distress.  Skin: Skin is warm and dry. No rash noted.   Cardiovascular: Normal heart rate noted  Respiratory: Normal respiratory effort, no problems with respiration noted  Abdomen: Soft, gravid, appropriate for gestational age. Pain/Pressure: Present     Pelvic: Vag. Bleeding: None     Cervical exam deferred        Extremities: Normal range of motion.  Edema: Trace  Mental Status: Normal mood and affect. Normal behavior. Normal judgment and thought content.   Urinalysis:      Assessment and Plan:  Pregnancy: G4P3003 at [redacted]w[redacted]d  1. Supervision of high risk pregnancy, antepartum BP and FHR  normal Rx sent for PPI per request  2. Preexisting diabetes complicating pregnancy in second trimester, antepartum A1c 5.8% and failed early 2 hr GTT, not actually T2DM, fetal echo not indicated GDMA1 Has been unable to check sugars due to loss of meter and multiple social issues Seen by MFM yesterday, growth US showed EFW 93%, 3411 g, AC >99%, AFI 22 Recommended for IOL at 37 weeks, scheduled for 03/11/22 when first available slot is open when she is [redacted]w[redacted]d Form faxed, orders placed  3. Hashimoto's thyroiditis TSH and free T4 normal last month  4. Asthma affecting pregnancy in third trimester Had been using albuterol daily Today reports has been much better since going to [facility] [place], not around smokers anymore Using every other week  5. Biological false positive RPR test Neg T pall Ab's this pregnancy Repeat of RPR last month with no change in titer  6. Bipolar 1 disorder ([place]) Referred to [facility] previously, encouraged to go  7. GBS bacteriuria Ppx in labor  8. History of herpes genitalis Rx previously sent for valtrex, encouraged to take daily  9. History of shoulder dystocia in prior pregnancy, currently pregnant in third trimester Last delivery with similar circumstances (uncontrolled GDM), infant weighed 3080 grams, required PT  10. Class 3 severe obesity due to excess calories without serious comorbidity with body mass index (BMI) of 40.0 to 44.9 in adult ([place])   11. Unwanted fertility Signed BTL papers on 01/27/2022  12. Psychosocial stressors Has been living at [facility]  Preterm labor symptoms and general obstetric precautions including but not limited to vaginal bleeding, contractions, leaking of fluid and fetal movement  were reviewed in detail with the patient. Please refer to After Visit Summary for other counseling recommendations.  Return in 1 week (on 03/13/2022) for [facility], ob visit, needs MD.   Clarnce Flock, MD

## 2022-03-07 ENCOUNTER — Encounter (HOSPITAL_COMMUNITY): Payer: Self-pay | Admitting: *Deleted

## 2022-03-07 ENCOUNTER — Telehealth (HOSPITAL_COMMUNITY): Payer: Self-pay | Admitting: *Deleted

## 2022-03-07 NOTE — Telephone Encounter (Signed)
Preadmission screen  

## 2022-03-09 LAB — GC/CHLAMYDIA PROBE AMP (~~LOC~~) NOT AT ARMC
Chlamydia: NEGATIVE
Comment: NEGATIVE
Comment: NORMAL
Neisseria Gonorrhea: NEGATIVE

## 2022-03-11 ENCOUNTER — Inpatient Hospital Stay (HOSPITAL_COMMUNITY): Payer: Medicaid Other | Admitting: Anesthesiology

## 2022-03-11 ENCOUNTER — Encounter (HOSPITAL_COMMUNITY): Payer: Self-pay | Admitting: Family Medicine

## 2022-03-11 ENCOUNTER — Inpatient Hospital Stay (HOSPITAL_COMMUNITY)
Admission: AD | Admit: 2022-03-11 | Discharge: 2022-03-14 | DRG: 784 | Disposition: A | Discharge status: Home / Self Care | Payer: Medicaid Other | Attending: Family Medicine | Admitting: Family Medicine

## 2022-03-11 ENCOUNTER — Other Ambulatory Visit: Payer: Self-pay

## 2022-03-11 ENCOUNTER — Inpatient Hospital Stay (HOSPITAL_COMMUNITY): Payer: Medicaid Other | Attending: Primary Care

## 2022-03-11 ENCOUNTER — Encounter (HOSPITAL_COMMUNITY)
Admission: AD | Disposition: A | Discharge status: Home / Self Care | Payer: Self-pay | Source: Home / Self Care | Attending: Family Medicine

## 2022-03-11 DIAGNOSIS — J45909 Unspecified asthma, uncomplicated: Secondary | ICD-10-CM | POA: Diagnosis not present

## 2022-03-11 DIAGNOSIS — O24419 Gestational diabetes mellitus in pregnancy, unspecified control: Secondary | ICD-10-CM | POA: Diagnosis present

## 2022-03-11 DIAGNOSIS — O24429 Gestational diabetes mellitus in childbirth, unspecified control: Secondary | ICD-10-CM

## 2022-03-11 DIAGNOSIS — O99824 Streptococcus B carrier state complicating childbirth: Secondary | ICD-10-CM | POA: Diagnosis present

## 2022-03-11 DIAGNOSIS — O99214 Obesity complicating childbirth: Secondary | ICD-10-CM | POA: Diagnosis present

## 2022-03-11 DIAGNOSIS — O99820 Streptococcus B carrier state complicating pregnancy: Secondary | ICD-10-CM | POA: Diagnosis not present

## 2022-03-11 DIAGNOSIS — E063 Autoimmune thyroiditis: Secondary | ICD-10-CM | POA: Diagnosis present

## 2022-03-11 DIAGNOSIS — O24420 Gestational diabetes mellitus in childbirth, diet controlled: Principal | ICD-10-CM | POA: Diagnosis present

## 2022-03-11 DIAGNOSIS — O0990 Supervision of high risk pregnancy, unspecified, unspecified trimester: Secondary | ICD-10-CM

## 2022-03-11 DIAGNOSIS — Z3A37 37 weeks gestation of pregnancy: Secondary | ICD-10-CM

## 2022-03-11 DIAGNOSIS — O9952 Diseases of the respiratory system complicating childbirth: Secondary | ICD-10-CM | POA: Diagnosis not present

## 2022-03-11 DIAGNOSIS — D649 Anemia, unspecified: Secondary | ICD-10-CM | POA: Diagnosis not present

## 2022-03-11 DIAGNOSIS — Z302 Encounter for sterilization: Secondary | ICD-10-CM

## 2022-03-11 DIAGNOSIS — E669 Obesity, unspecified: Secondary | ICD-10-CM | POA: Diagnosis present

## 2022-03-11 DIAGNOSIS — Z658 Other specified problems related to psychosocial circumstances: Secondary | ICD-10-CM

## 2022-03-11 DIAGNOSIS — A6000 Herpesviral infection of urogenital system, unspecified: Secondary | ICD-10-CM | POA: Diagnosis present

## 2022-03-11 DIAGNOSIS — Z87891 Personal history of nicotine dependence: Secondary | ICD-10-CM

## 2022-03-11 DIAGNOSIS — R768 Other specified abnormal immunological findings in serum: Secondary | ICD-10-CM

## 2022-03-11 DIAGNOSIS — F319 Bipolar disorder, unspecified: Secondary | ICD-10-CM

## 2022-03-11 DIAGNOSIS — O9832 Other infections with a predominantly sexual mode of transmission complicating childbirth: Secondary | ICD-10-CM | POA: Diagnosis present

## 2022-03-11 DIAGNOSIS — O9902 Anemia complicating childbirth: Secondary | ICD-10-CM | POA: Diagnosis not present

## 2022-03-11 DIAGNOSIS — O09823 Supervision of pregnancy with history of in utero procedure during previous pregnancy, third trimester: Secondary | ICD-10-CM

## 2022-03-11 DIAGNOSIS — O99284 Endocrine, nutritional and metabolic diseases complicating childbirth: Secondary | ICD-10-CM | POA: Diagnosis present

## 2022-03-11 DIAGNOSIS — Z3009 Encounter for other general counseling and advice on contraception: Secondary | ICD-10-CM

## 2022-03-11 DIAGNOSIS — Z87898 Personal history of other specified conditions: Secondary | ICD-10-CM

## 2022-03-11 DIAGNOSIS — Z9079 Acquired absence of other genital organ(s): Secondary | ICD-10-CM

## 2022-03-11 DIAGNOSIS — O09293 Supervision of pregnancy with other poor reproductive or obstetric history, third trimester: Secondary | ICD-10-CM

## 2022-03-11 DIAGNOSIS — Z98891 History of uterine scar from previous surgery: Secondary | ICD-10-CM

## 2022-03-11 DIAGNOSIS — O24312 Unspecified pre-existing diabetes mellitus in pregnancy, second trimester: Secondary | ICD-10-CM

## 2022-03-11 DIAGNOSIS — O24424 Gestational diabetes mellitus in childbirth, insulin controlled: Secondary | ICD-10-CM

## 2022-03-11 DIAGNOSIS — Z8619 Personal history of other infectious and parasitic diseases: Secondary | ICD-10-CM

## 2022-03-11 DIAGNOSIS — R8271 Bacteriuria: Secondary | ICD-10-CM | POA: Diagnosis present

## 2022-03-11 LAB — GLUCOSE, CAPILLARY
Glucose-Capillary: 67 mg/dL — ABNORMAL LOW (ref 70–99)
Glucose-Capillary: 77 mg/dL (ref 70–99)

## 2022-03-11 LAB — CBC
HCT: 34.6 % — ABNORMAL LOW (ref 36.0–46.0)
Hemoglobin: 11.2 g/dL — ABNORMAL LOW (ref 12.0–15.0)
MCH: 25.4 pg — ABNORMAL LOW (ref 26.0–34.0)
MCHC: 32.4 g/dL (ref 30.0–36.0)
MCV: 78.5 fL — ABNORMAL LOW (ref 80.0–100.0)
Platelets: 193 10*3/uL (ref 150–400)
RBC: 4.41 MIL/uL (ref 3.87–5.11)
RDW: 17.8 % — ABNORMAL HIGH (ref 11.5–15.5)
WBC: 5.9 10*3/uL (ref 4.0–10.5)
nRBC: 0 % (ref 0.0–0.2)

## 2022-03-11 LAB — POCT I-STAT EG7
Acid-base deficit: 4 mmol/L — ABNORMAL HIGH (ref 0.0–2.0)
Bicarbonate: 21.5 mmol/L (ref 20.0–28.0)
Calcium, Ion: 1.23 mmol/L (ref 1.15–1.40)
HCT: 28 % — ABNORMAL LOW (ref 36.0–46.0)
Hemoglobin: 9.5 g/dL — ABNORMAL LOW (ref 12.0–15.0)
O2 Saturation: 31 %
Potassium: 4.2 mmol/L (ref 3.5–5.1)
Sodium: 138 mmol/L (ref 135–145)
TCO2: 23 mmol/L (ref 22–32)
pCO2, Ven: 40.8 mmHg — ABNORMAL LOW (ref 44–60)
pH, Ven: 7.328 (ref 7.25–7.43)
pO2, Ven: 21 mmHg — CL (ref 32–45)

## 2022-03-11 LAB — COMPREHENSIVE METABOLIC PANEL
ALT: 17 U/L (ref 0–44)
AST: 20 U/L (ref 15–41)
Albumin: 2.6 g/dL — ABNORMAL LOW (ref 3.5–5.0)
Alkaline Phosphatase: 147 U/L — ABNORMAL HIGH (ref 38–126)
Anion gap: 10 (ref 5–15)
BUN: 5 mg/dL — ABNORMAL LOW (ref 6–20)
CO2: 22 mmol/L (ref 22–32)
Calcium: 8.9 mg/dL (ref 8.9–10.3)
Chloride: 105 mmol/L (ref 98–111)
Creatinine, Ser: 0.61 mg/dL (ref 0.44–1.00)
GFR, Estimated: >60 mL/min (ref >60)
Glucose, Bld: 102 mg/dL — ABNORMAL HIGH (ref 70–99)
Potassium: 4.1 mmol/L (ref 3.5–5.1)
Sodium: 137 mmol/L (ref 135–145)
Total Bilirubin: 0.3 mg/dL (ref 0.3–1.2)
Total Protein: 6 g/dL — ABNORMAL LOW (ref 6.5–8.1)

## 2022-03-11 LAB — TYPE AND SCREEN
ABO/RH(D): A POS
Antibody Screen: NEGATIVE

## 2022-03-11 LAB — RPR
RPR Ser Ql: REACTIVE — AB
RPR Titer: 1:2 {titer}

## 2022-03-11 LAB — PROTEIN / CREATININE RATIO, URINE
Creatinine, Urine: 150 mg/dL
Protein Creatinine Ratio: 0.13 mg/mg{Cre} (ref 0.00–0.15)
Total Protein, Urine: 20 mg/dL

## 2022-03-11 SURGERY — Surgical Case
Anesthesia: Spinal

## 2022-03-11 MED ORDER — OXYTOCIN-SODIUM CHLORIDE 30-0.9 UT/500ML-% IV SOLN
INTRAVENOUS | Status: AC
  Filled 2022-03-11: qty 500

## 2022-03-11 MED ORDER — ACETAMINOPHEN 325 MG PO TABS: 650.0000 mg | ORAL_TABLET | ORAL | Status: DC | PRN

## 2022-03-11 MED ORDER — SIMETHICONE 80 MG PO CHEW: 80.0000 mg | CHEWABLE_TABLET | ORAL | Status: DC | PRN

## 2022-03-11 MED ORDER — OXYCODONE HCL 5 MG PO TABS: 5.0000 mg | ORAL_TABLET | Freq: Once | ORAL | Status: DC | PRN

## 2022-03-11 MED ORDER — WITCH HAZEL-GLYCERIN EX PADS: 1.0000 | MEDICATED_PAD | CUTANEOUS | Status: DC | PRN

## 2022-03-11 MED ORDER — LIDOCAINE HCL (PF) 1 % IJ SOLN: 30.0000 mL | INTRAMUSCULAR | Status: DC | PRN

## 2022-03-11 MED ORDER — COCONUT OIL OIL: 1.0000 | TOPICAL_OIL | Status: DC | PRN

## 2022-03-11 MED ORDER — SENNOSIDES-DOCUSATE SODIUM 8.6-50 MG PO TABS
2.0000 | ORAL_TABLET | Freq: Every day | ORAL | Status: DC
  Administered 2022-03-12 – 2022-03-14 (×3): 2 via ORAL
  Filled 2022-03-11 (×3): qty 2

## 2022-03-11 MED ORDER — HYDROMORPHONE HCL 1 MG/ML IJ SOLN: 0.2500 mg | INTRAMUSCULAR | Status: DC | PRN

## 2022-03-11 MED ORDER — PHENYLEPHRINE HCL-NACL 20-0.9 MG/250ML-% IV SOLN
INTRAVENOUS | Status: AC
  Filled 2022-03-11: qty 250

## 2022-03-11 MED ORDER — ACETAMINOPHEN 500 MG PO TABS: 1000.0000 mg | ORAL_TABLET | Freq: Four times a day (QID) | ORAL | Status: DC

## 2022-03-11 MED ORDER — KETOROLAC TROMETHAMINE 30 MG/ML IJ SOLN
INTRAMUSCULAR | Status: AC
  Filled 2022-03-11: qty 1

## 2022-03-11 MED ORDER — ACETAMINOPHEN 10 MG/ML IV SOLN
INTRAVENOUS | Status: AC
  Filled 2022-03-11: qty 100

## 2022-03-11 MED ORDER — SCOPOLAMINE 1 MG/3DAYS TD PT72
MEDICATED_PATCH | TRANSDERMAL | Status: AC
  Filled 2022-03-11: qty 1

## 2022-03-11 MED ORDER — MORPHINE SULFATE (PF) 0.5 MG/ML IJ SOLN
INTRAMUSCULAR | Status: DC | PRN
  Administered 2022-03-11: 150 ug via INTRATHECAL

## 2022-03-11 MED ORDER — NALOXONE HCL 0.4 MG/ML IJ SOLN: 0.4000 mg | INTRAMUSCULAR | Status: DC | PRN

## 2022-03-11 MED ORDER — KETOROLAC TROMETHAMINE 30 MG/ML IJ SOLN: 30.0000 mg | Freq: Once | INTRAMUSCULAR | Status: DC | PRN

## 2022-03-11 MED ORDER — OXYCODONE HCL 5 MG PO TABS: 5.0000 mg | ORAL_TABLET | ORAL | Status: DC | PRN

## 2022-03-11 MED ORDER — MAGNESIUM HYDROXIDE 400 MG/5ML PO SUSP: 30.0000 mL | ORAL | Status: DC | PRN

## 2022-03-11 MED ORDER — GABAPENTIN 100 MG PO CAPS
200.0000 mg | ORAL_CAPSULE | Freq: Every day | ORAL | Status: DC
  Administered 2022-03-11 – 2022-03-14 (×3): 200 mg via ORAL
  Filled 2022-03-11 (×3): qty 2

## 2022-03-11 MED ORDER — ACETAMINOPHEN 500 MG PO TABS
1000.0000 mg | ORAL_TABLET | Freq: Four times a day (QID) | ORAL | Status: DC
  Administered 2022-03-11 – 2022-03-14 (×10): 1000 mg via ORAL
  Filled 2022-03-11 (×10): qty 2

## 2022-03-11 MED ORDER — SODIUM CHLORIDE 0.9% FLUSH: 3.0000 mL | INTRAVENOUS | Status: DC | PRN

## 2022-03-11 MED ORDER — ONDANSETRON HCL 4 MG/2ML IJ SOLN
INTRAMUSCULAR | Status: AC
  Filled 2022-03-11: qty 2

## 2022-03-11 MED ORDER — KETOROLAC TROMETHAMINE 30 MG/ML IJ SOLN: 30.0000 mg | Freq: Four times a day (QID) | INTRAMUSCULAR | Status: DC | PRN

## 2022-03-11 MED ORDER — STERILE WATER FOR IRRIGATION IR SOLN
Status: DC | PRN
  Administered 2022-03-11: 1

## 2022-03-11 MED ORDER — PRENATAL MULTIVITAMIN CH
1.0000 | ORAL_TABLET | Freq: Every day | ORAL | Status: DC
  Administered 2022-03-12 – 2022-03-14 (×3): 1 via ORAL
  Filled 2022-03-11 (×3): qty 1

## 2022-03-11 MED ORDER — ALBUMIN HUMAN 5 % IV SOLN
INTRAVENOUS | Status: AC
  Filled 2022-03-11: qty 250

## 2022-03-11 MED ORDER — FENTANYL CITRATE (PF) 100 MCG/2ML IJ SOLN
INTRAMUSCULAR | Status: AC
  Filled 2022-03-11: qty 2

## 2022-03-11 MED ORDER — SIMETHICONE 80 MG PO CHEW
80.0000 mg | CHEWABLE_TABLET | Freq: Three times a day (TID) | ORAL | Status: DC
  Administered 2022-03-11 – 2022-03-14 (×8): 80 mg via ORAL
  Filled 2022-03-11 (×8): qty 1

## 2022-03-11 MED ORDER — ONDANSETRON HCL 4 MG/2ML IJ SOLN: 4.0000 mg | Freq: Once | INTRAMUSCULAR | Status: DC | PRN

## 2022-03-11 MED ORDER — SCOPOLAMINE 1 MG/3DAYS TD PT72
1.0000 | MEDICATED_PATCH | Freq: Once | TRANSDERMAL | Status: AC
  Administered 2022-03-11: 1.5 mg via TRANSDERMAL

## 2022-03-11 MED ORDER — OXYTOCIN-SODIUM CHLORIDE 30-0.9 UT/500ML-% IV SOLN: 2.5000 [IU]/h | INTRAVENOUS | Status: DC

## 2022-03-11 MED ORDER — PHENYLEPHRINE HCL-NACL 20-0.9 MG/250ML-% IV SOLN
INTRAVENOUS | Status: DC | PRN
  Administered 2022-03-11: 60 ug/min via INTRAVENOUS

## 2022-03-11 MED ORDER — ONDANSETRON HCL 4 MG/2ML IJ SOLN: 4.0000 mg | Freq: Four times a day (QID) | INTRAMUSCULAR | Status: DC | PRN

## 2022-03-11 MED ORDER — DIPHENHYDRAMINE HCL 25 MG PO CAPS: 25.0000 mg | ORAL_CAPSULE | Freq: Four times a day (QID) | ORAL | Status: DC | PRN

## 2022-03-11 MED ORDER — INFLUENZA VAC SPLIT QUAD 0.5 ML IM SUSY: 0.5000 mL | PREFILLED_SYRINGE | INTRAMUSCULAR | Status: DC

## 2022-03-11 MED ORDER — SODIUM CHLORIDE 0.9 % IR SOLN
Status: DC | PRN
  Administered 2022-03-11: 1

## 2022-03-11 MED ORDER — DIBUCAINE (PERIANAL) 1 % EX OINT: 1.0000 | TOPICAL_OINTMENT | CUTANEOUS | Status: DC | PRN

## 2022-03-11 MED ORDER — FENTANYL CITRATE (PF) 100 MCG/2ML IJ SOLN
INTRAMUSCULAR | Status: DC | PRN
  Administered 2022-03-11: 15 ug via INTRATHECAL

## 2022-03-11 MED ORDER — NALOXONE HCL 4 MG/10ML IJ SOLN: 1.0000 ug/kg/h | INTRAVENOUS | Status: DC | PRN

## 2022-03-11 MED ORDER — ONDANSETRON HCL 4 MG/2ML IJ SOLN
INTRAMUSCULAR | Status: DC | PRN
  Administered 2022-03-11: 4 mg via INTRAVENOUS

## 2022-03-11 MED ORDER — LACTATED RINGERS IV SOLN: INTRAVENOUS | Status: DC

## 2022-03-11 MED ORDER — MEPERIDINE HCL 25 MG/ML IJ SOLN: 6.2500 mg | INTRAMUSCULAR | Status: DC | PRN

## 2022-03-11 MED ORDER — OXYTOCIN-SODIUM CHLORIDE 30-0.9 UT/500ML-% IV SOLN
INTRAVENOUS | Status: DC | PRN
  Administered 2022-03-11: 300 mL via INTRAVENOUS
  Administered 2022-03-11: 200 mL via INTRAVENOUS

## 2022-03-11 MED ORDER — ACETAMINOPHEN 10 MG/ML IV SOLN
INTRAVENOUS | Status: DC | PRN
  Administered 2022-03-11: 1000 mg via INTRAVENOUS

## 2022-03-11 MED ORDER — TRANEXAMIC ACID-NACL 1000-0.7 MG/100ML-% IV SOLN
INTRAVENOUS | Status: AC
  Filled 2022-03-11: qty 100

## 2022-03-11 MED ORDER — PENICILLIN G POT IN DEXTROSE 60000 UNIT/ML IV SOLN: 3.0000 10*6.[IU] | INTRAVENOUS | Status: DC

## 2022-03-11 MED ORDER — BUPIVACAINE IN DEXTROSE 0.75-8.25 % IT SOLN
INTRATHECAL | Status: DC | PRN
  Administered 2022-03-11: 1.6 mg via INTRATHECAL

## 2022-03-11 MED ORDER — OXYTOCIN-SODIUM CHLORIDE 30-0.9 UT/500ML-% IV SOLN: 2.5000 [IU]/h | INTRAVENOUS | Status: AC

## 2022-03-11 MED ORDER — FENTANYL CITRATE (PF) 100 MCG/2ML IJ SOLN: 50.0000 ug | INTRAMUSCULAR | Status: DC | PRN

## 2022-03-11 MED ORDER — ZOLPIDEM TARTRATE 5 MG PO TABS: 5.0000 mg | ORAL_TABLET | Freq: Every evening | ORAL | Status: DC | PRN

## 2022-03-11 MED ORDER — LACTATED RINGERS IV SOLN: 500.0000 mL | INTRAVENOUS | Status: DC | PRN

## 2022-03-11 MED ORDER — ENOXAPARIN SODIUM 60 MG/0.6ML IJ SOSY
60.0000 mg | PREFILLED_SYRINGE | INTRAMUSCULAR | Status: DC
  Administered 2022-03-12 – 2022-03-13 (×2): 60 mg via SUBCUTANEOUS
  Filled 2022-03-11 (×2): qty 0.6

## 2022-03-11 MED ORDER — DIPHENHYDRAMINE HCL 50 MG/ML IJ SOLN: 12.5000 mg | INTRAMUSCULAR | Status: DC | PRN

## 2022-03-11 MED ORDER — DEXAMETHASONE SODIUM PHOSPHATE 10 MG/ML IJ SOLN
INTRAMUSCULAR | Status: DC | PRN
  Administered 2022-03-11: 4 mg via INTRAVENOUS

## 2022-03-11 MED ORDER — DIPHENHYDRAMINE HCL 25 MG PO CAPS: 25.0000 mg | ORAL_CAPSULE | ORAL | Status: DC | PRN

## 2022-03-11 MED ORDER — MENTHOL 3 MG MT LOZG: 1.0000 | LOZENGE | OROMUCOSAL | Status: DC | PRN

## 2022-03-11 MED ORDER — TRANEXAMIC ACID-NACL 1000-0.7 MG/100ML-% IV SOLN
INTRAVENOUS | Status: DC | PRN
  Administered 2022-03-11: 1000 mg via INTRAVENOUS

## 2022-03-11 MED ORDER — AMISULPRIDE (ANTIEMETIC) 5 MG/2ML IV SOLN: 10.0000 mg | Freq: Once | INTRAVENOUS | Status: DC | PRN

## 2022-03-11 MED ORDER — ALBUMIN HUMAN 5 % IV SOLN: INTRAVENOUS | Status: DC | PRN

## 2022-03-11 MED ORDER — SOD CITRATE-CITRIC ACID 500-334 MG/5ML PO SOLN
30.0000 mL | ORAL | Status: DC | PRN
  Administered 2022-03-11: 30 mL via ORAL
  Filled 2022-03-11: qty 30

## 2022-03-11 MED ORDER — MORPHINE SULFATE (PF) 0.5 MG/ML IJ SOLN
INTRAMUSCULAR | Status: AC
  Filled 2022-03-11: qty 10

## 2022-03-11 MED ORDER — CEFAZOLIN SODIUM-DEXTROSE 2-3 GM-%(50ML) IV SOLR
INTRAVENOUS | Status: DC | PRN
  Administered 2022-03-11: 2 g via INTRAVENOUS

## 2022-03-11 MED ORDER — ONDANSETRON HCL 4 MG/2ML IJ SOLN: 4.0000 mg | Freq: Three times a day (TID) | INTRAMUSCULAR | Status: DC | PRN

## 2022-03-11 MED ORDER — SODIUM CHLORIDE 0.9 % IV SOLN
5.0000 10*6.[IU] | Freq: Once | INTRAVENOUS | Status: AC
  Administered 2022-03-11: 5 10*6.[IU] via INTRAVENOUS
  Filled 2022-03-11: qty 5

## 2022-03-11 MED ORDER — IBUPROFEN 600 MG PO TABS
600.0000 mg | ORAL_TABLET | Freq: Four times a day (QID) | ORAL | Status: DC
  Administered 2022-03-12 – 2022-03-14 (×7): 600 mg via ORAL
  Filled 2022-03-11 (×7): qty 1

## 2022-03-11 MED ORDER — OXYCODONE HCL 5 MG/5ML PO SOLN: 5.0000 mg | Freq: Once | ORAL | Status: DC | PRN

## 2022-03-11 MED ORDER — KETOROLAC TROMETHAMINE 30 MG/ML IJ SOLN
30.0000 mg | Freq: Four times a day (QID) | INTRAMUSCULAR | Status: DC | PRN
  Administered 2022-03-11: 30 mg via INTRAVENOUS

## 2022-03-11 MED ORDER — KETOROLAC TROMETHAMINE 30 MG/ML IJ SOLN
30.0000 mg | Freq: Four times a day (QID) | INTRAMUSCULAR | Status: AC
  Administered 2022-03-11 – 2022-03-12 (×4): 30 mg via INTRAVENOUS
  Filled 2022-03-11 (×4): qty 1

## 2022-03-11 MED ORDER — OXYTOCIN BOLUS FROM INFUSION: 333.0000 mL | Freq: Once | INTRAVENOUS | Status: DC

## 2022-03-11 SURGICAL SUPPLY — 38 items
ADH SKN CLS APL DERMABOND .7 (GAUZE/BANDAGES/DRESSINGS) ×2
APL PRP STRL LF DISP 70% ISPRP (MISCELLANEOUS) ×2
APL SKNCLS STERI-STRIP NONHPOA (GAUZE/BANDAGES/DRESSINGS) ×1
BENZOIN TINCTURE PRP APPL 2/3 (GAUZE/BANDAGES/DRESSINGS) IMPLANT
CHLORAPREP W/TINT 26 (MISCELLANEOUS) ×2 IMPLANT
CLAMP UMBILICAL CORD (MISCELLANEOUS) ×1 IMPLANT
CLOTH BEACON ORANGE TIMEOUT ST (SAFETY) ×1 IMPLANT
DERMABOND ADVANCED .7 DNX12 (GAUZE/BANDAGES/DRESSINGS) ×2 IMPLANT
DRSG OPSITE POSTOP 4X10 (GAUZE/BANDAGES/DRESSINGS) ×1 IMPLANT
ELECT REM PT RETURN 9FT ADLT (ELECTROSURGICAL) ×1
ELECTRODE REM PT RTRN 9FT ADLT (ELECTROSURGICAL) ×1 IMPLANT
EXTRACTOR VACUUM M CUP 4 TUBE (SUCTIONS) IMPLANT
GAUZE SPONGE 4X4 12PLY STRL LF (GAUZE/BANDAGES/DRESSINGS) IMPLANT
GLOVE BIOGEL PI IND STRL 7.0 (GLOVE) ×2 IMPLANT
GLOVE BIOGEL PI IND STRL 7.5 (GLOVE) ×2 IMPLANT
GLOVE ECLIPSE 7.5 STRL STRAW (GLOVE) ×1 IMPLANT
GOWN STRL REUS W/TWL LRG LVL3 (GOWN DISPOSABLE) ×3 IMPLANT
KIT ABG SYR 3ML LUER SLIP (SYRINGE) IMPLANT
LIGASURE IMPACT 36 18CM CVD LR (INSTRUMENTS) IMPLANT
NDL HYPO 25X5/8 SAFETYGLIDE (NEEDLE) IMPLANT
NEEDLE HYPO 25X5/8 SAFETYGLIDE (NEEDLE) IMPLANT
NS IRRIG 1000ML POUR BTL (IV SOLUTION) ×1 IMPLANT
PACK C SECTION WH (CUSTOM PROCEDURE TRAY) ×1 IMPLANT
PAD ABD 7.5X8 STRL (GAUZE/BANDAGES/DRESSINGS) IMPLANT
PAD OB MATERNITY 4.3X12.25 (PERSONAL CARE ITEMS) ×1 IMPLANT
RTRCTR C-SECT PINK 25CM LRG (MISCELLANEOUS) ×1 IMPLANT
STRIP CLOSURE SKIN 1/2X4 (GAUZE/BANDAGES/DRESSINGS) IMPLANT
SUT MNCRL 0 VIOLET CTX 36 (SUTURE) ×2 IMPLANT
SUT MONOCRYL 0 CTX 36 (SUTURE) ×2
SUT PLAIN 2 0 XLH (SUTURE) IMPLANT
SUT VIC AB 0 CTX 36 (SUTURE) ×1
SUT VIC AB 0 CTX36XBRD ANBCTRL (SUTURE) ×1 IMPLANT
SUT VIC AB 2-0 CT1 27 (SUTURE) ×1
SUT VIC AB 2-0 CT1 TAPERPNT 27 (SUTURE) ×1 IMPLANT
SUT VIC AB 4-0 KS 27 (SUTURE) ×1 IMPLANT
TOWEL OR 17X24 6PK STRL BLUE (TOWEL DISPOSABLE) ×1 IMPLANT
TRAY FOLEY W/BAG SLVR 14FR LF (SET/KITS/TRAYS/PACK) ×1 IMPLANT
WATER STERILE IRR 1000ML POUR (IV SOLUTION) ×1 IMPLANT

## 2022-03-11 NOTE — Anesthesia Preprocedure Evaluation (Addendum)
Anesthesia Evaluation  Patient identified by MRN, date of birth, ID band Patient awake    Reviewed: Allergy & Precautions, NPO status , Patient's Chart, lab work & pertinent test results  Airway Mallampati: IV  TM Distance: >3 FB Neck ROM: Full    Dental  (+) Teeth Intact, Dental Advisory Given   Pulmonary asthma , former smoker   Pulmonary exam normal breath sounds clear to auscultation       Cardiovascular negative cardio ROS Normal cardiovascular exam Rhythm:Regular Rate:Normal     Neuro/Psych  PSYCHIATRIC DISORDERS Anxiety Depression Bipolar Disorder   negative neurological ROS     GI/Hepatic negative GI ROS, Neg liver ROS,,,  Endo/Other  diabetes, Well Controlled, GestationalHypothyroidism  Morbid obesityBMI 74  Renal/GU negative Renal ROS  negative genitourinary   Musculoskeletal negative musculoskeletal ROS (+)    Abdominal  (+) + obese  Peds negative pediatric ROS (+)  Hematology  (+) Blood dyscrasia, anemia Hb 11.2, plt 192   Anesthesia Other Findings   Reproductive/Obstetrics (+) Pregnancy                             Anesthesia Physical Anesthesia Plan  ASA: 3  Anesthesia Plan: Spinal   Post-op Pain Management: Regional block, Toradol IV (intra-op)* and Ofirmev IV (intra-op)*   Induction:   PONV Risk Score and Plan: 3 and Ondansetron, Dexamethasone and Treatment may vary due to age or medical condition  Airway Management Planned: Natural Airway and Nasal Cannula  Additional Equipment: None  Intra-op Plan:   Post-operative Plan:   Informed Consent: I have reviewed the patients History and Physical, chart, labs and discussed the procedure including the risks, benefits and alternatives for the proposed anesthesia with the patient or authorized representative who has indicated his/her understanding and acceptance.       Plan Discussed with: CRNA  Anesthesia Plan  Comments: (Primary section for hx shoulder dystocia last delivery )       Anesthesia Quick Evaluation

## 2022-03-11 NOTE — Lactation Note (Signed)
This note was copied from a baby's chart. Lactation Consultation Note  Patient Name: Alyssa Hartman [place] [id number] Date: 03/11/2022 Reason for consult: Initial assessment;Early term 37-38.6wks;Maternal endocrine disorder Age:27 hours Baby has been supplemented w/formula d/t low glucose.  Maternal Data    Feeding Mother's Current Feeding Choice: Breast Milk and Formula Nipple Type: Slow - flow  LATCH Score Latch: Grasps breast easily, tongue down, lips flanged, rhythmical sucking.  Audible Swallowing: None  Type of Nipple: Everted at rest and after stimulation  Comfort (Breast/Nipple): Soft / non-tender  Hold (Positioning): Assistance needed to correctly position infant at breast and maintain latch.  LATCH Score: 7   Lactation Tools Discussed/Used Tools: Pump;Flanges Flange Size: 21 Breast pump type: Double-Electric Breast Pump Pump Education: Setup, frequency, and cleaning;Milk Storage Reason for Pumping: supplementation Pumping frequency: q3hr  Interventions Interventions: Breast feeding basics reviewed;Adjust position;DEBP;Assisted with latch;Support pillows;Skin to skin;Position options;Breast massage;LC Services brochure;Breast compression  Discharge    Consult Status Consult Status: Follow-up Date: 03/12/21 Follow-up type: In-patient    Cortnee Steinmiller, Elta [place] 03/11/2022, 9:00 PM

## 2022-03-11 NOTE — Anesthesia Postprocedure Evaluation (Signed)
Anesthesia Post Note  Patient: Alyssa Hartman  Procedure(s) Performed: CESAREAN SECTION     Patient location during evaluation: PACU Anesthesia Type: Spinal Level of consciousness: awake and alert and oriented Pain management: pain level controlled Vital Signs Assessment: post-procedure vital signs reviewed and stable Respiratory status: spontaneous breathing, nonlabored ventilation and respiratory function stable Cardiovascular status: blood pressure returned to baseline and stable Postop Assessment: no headache, no backache, spinal receding and patient able to bend at knees Anesthetic complications: no   No notable events documented.  Last Vitals:  Vitals:   03/11/22 1332 03/11/22 1345  BP: 108/69 108/69  Pulse: 74 64  Resp: 16 18  Temp:  36.6 C  SpO2: 100% 100%    Last Pain:  Vitals:   03/11/22 1345  TempSrc: Oral  PainSc: 0-No pain   Pain Goal: Patients Stated Pain Goal: 0 (03/11/22 0849)              Epidural/Spinal Function Cutaneous sensation: Tingles (03/11/22 1345), Patient able to flex knees: Yes (03/11/22 1345), Patient able to lift hips off bed: No (03/11/22 1345), Back pain beyond tenderness at insertion site: No (03/11/22 1345), Progressively worsening motor and/or sensory loss: No (03/11/22 1345), Bowel and/or bladder incontinence post epidural: No (03/11/22 1345)  Pervis Hocking

## 2022-03-11 NOTE — Lactation Note (Signed)
This note was copied from a baby's chart. Lactation Consultation Note  Patient Name: Alyssa Hartman [name] [id number] Date: 03/11/2022 Reason for consult: Initial assessment;Early term 37-38.6wks;Maternal endocrine disorder Age:27 hours Mom holding baby STS to help w/low glucose. Baby has had formula and glucose gel. Baby is a little jittery. Baby did latch in football hold and BF well. Mom was pleased. Breast noted slightly softer but no swallows seen or heard. Worked on positioning, support and body alignment. Mom had good milk supply w/her last child. She pumped for 6 months then her milk dried up. Mom was hoping to for over a year.  Mom wants to latch and pump with this baby. Discussed w/mom the need to supplement for at least 24 hrs d/t GDM. Mom in agreement. Mom encouraged to feed baby 8-12 times/24 hours and with feeding cues.  Mom shown how to use DEBP & how to disassemble, clean, & reassemble parts. Suggested mom pump q3hrs  Gave mom supplementation sheet and reviewed. Mom understands to give her colostrum 1st then formula as needed. Encouraged to call for assistance or questions. Maternal Data Has patient been taught Hand Expression?: Yes Does the patient have breastfeeding experience prior to this delivery?: Yes How long did the patient breastfeed?: mom pumped for her 16 yr old for 6 months  Feeding Mother's Current Feeding Choice: Breast Milk and Formula Nipple Type: Slow - flow  LATCH Score Latch: Grasps breast easily, tongue down, lips flanged, rhythmical sucking.  Audible Swallowing: None  Type of Nipple: Everted at rest and after stimulation  Comfort (Breast/Nipple): Soft / non-tender  Hold (Positioning): Assistance needed to correctly position infant at breast and maintain latch.  LATCH Score: 7   Lactation Tools Discussed/Used Tools: Pump;Flanges Flange Size: 21 Breast pump type: Double-Electric Breast Pump Pump Education: Setup, frequency, and cleaning;Milk  Storage Reason for Pumping: supplementation Pumping frequency: q3hr  Interventions Interventions: Breast feeding basics reviewed;Adjust position;DEBP;Assisted with latch;Support pillows;Skin to skin;Position options;Breast massage;LC Services brochure;Breast compression  Discharge    Consult Status Consult Status: Follow-up Date: 03/12/21 Follow-up type: In-patient    Theodoro Kalata 03/11/2022, 9:02 PM

## 2022-03-11 NOTE — Op Note (Signed)
[name] PROCEDURE DATE: 03/11/2022  PREOPERATIVE DIAGNOSES: Intrauterine pregnancy at [redacted]w[redacted]d weeks gestation;  h/o complication of shoulder dystocia  POSTOPERATIVE DIAGNOSES: The same, viable infant delivered  PROCEDURE: Primary Low Transverse Cesarean Section  SURGEON:  Dr. Loma Boston  ASSISTANT:  Shelda Pal, DO An experienced assistant was required given the standard of surgical care given the complexity of the case.  This assistant was needed for exposure, dissection, suctioning, retraction, instrument exchange, assisting with delivery with administration of fundal pressure, and for overall help during the procedure.  ANESTHESIOLOGY TEAM: Anesthesiologist: Pervis Hocking, DO CRNA: Asher Muir, CRNA  INDICATIONS: Alyssa Hartman is a 27 y.o. [phone] at [redacted]w[redacted]d here for cesarean section secondary to the indications listed under preoperative diagnoses; please see preoperative note for further details.  The risks of surgery were discussed with the patient including but were not limited to: bleeding which may require transfusion or reoperation; infection which may require antibiotics; injury to bowel, bladder, ureters or other surrounding organs; injury to the fetus; need for additional procedures including hysterectomy in the event of a life-threatening hemorrhage; formation of adhesions; placental abnormalities wth subsequent pregnancies; incisional problems; thromboembolic phenomenon and other postoperative/anesthesia complications.  The patient concurred with the proposed plan, giving informed written consent for the procedure.    FINDINGS:  Viable female infant in cephalic presentation.  Apgars 8 and 9.  Amniotic fluid: clear.  Intact placenta, three vessel cord.  Normal uterus, fallopian tubes and ovaries bilaterally.  ANESTHESIA: epidural INTRAVENOUS FLUIDS: 2600 ml   ESTIMATED BLOOD LOSS: 1527 ml URINE OUTPUT:  75 ml SPECIMENS: Placenta sent to L&D  . COMPLICATIONS: None immediate  PROCEDURE IN DETAIL:  The patient preoperatively received intravenous antibiotics and had sequential compression devices applied to her lower extremities.  She was then taken to the operating room where the epidural was dosed up to a surgical level and found to be adequate. She was then placed in a dorsal supine position with a leftward tilt, and prepped and draped in a sterile manner.  A foley catheter was  was already in place.  After an adequate timeout was performed, a Pfannenstiel skin incision was made with scalpel and carried through to the underlying layer of fascia. The fascia was incised in the midline, and this incision was extended bluntly. The lateral edges of the fascial were extended sharply.The rectus muscles were separated in the midline and the peritoneum was entered bluntly.   The Alexis self-retaining retractor was introduced into the abdominal cavity.  Attention was turned to the lower uterine segment where a low transverse hysterotomy was made with a scalpel and extended bluntly in caudad and cephalad directions.  The infant was successfully delivered, the cord was clamped and cut after one minute, and the infant was handed over to the awaiting neonatology team. Uterine massage was then administered, and the placenta delivered intact with a three-vessel cord. The uterus was then cleared of clots and debris.  The hysterotomy was closed with 0-Monocryl in a running fashion.  Figure-of-eight 0 Vicryl serosal stitches were placed to help with hemostasis.    Attention was then turned to the patient's uterus, and left fallopian tube was identified and followed out to the fimbriated end.  Ligasure device was used to cauterize and cut the mesosalpinx to proximal end of the fallopian tube, removing 6cm of tube. A similar process was carried out on the right side allowing for bilateral tubal sterilization.   The pelvis was cleared of all  clot and debris.  Hemostasis was confirmed on all surfaces.  The uterus was once again inspected and found to be hemostatic. The retractor was removed.  The peritoneum was closed with a 2-0 Vicryl running stitch. The fascia was then closed using 0 Vicryl in a running fashion.  The subcutaneous layer was irrigated, any areas of bleeding were cauterized with the bovie,  was reapproximated with 2-0 plain gut in a running fashion, was found to be hemostatic.Marland KitchenMarland KitchenThe skin was closed with a 4-0 Vicryl subcuticular stitch. The patient tolerated the procedure well. Sponge, instrument and needle counts were correct x 3.  She was taken to the recovery room in stable condition.   Shelda Pal, [place] Fellow, Faculty practice [place] for [facility] 03/11/22  2:48 PM

## 2022-03-11 NOTE — H&P (Signed)
OBSTETRIC ADMISSION HISTORY AND PHYSICAL  Alyssa Hartman is a 27 y.o. female [phone] with IUP at [redacted]w[redacted]d by LMP presenting for IOL A2GDM. She reports +FMs, No LOF, no VB, no blurry vision, headaches or peripheral edema, and RUQ pain.  She plans on Breast and bottle feeding. She request BTL for birth control. She received her prenatal care at [facility]   Dating: By LMP --->  Estimated Date of Delivery: 03/31/22  Sono:   @[redacted]w[redacted]d , CWD, normal anatomy, cephalic presentation, 6962X, 93% EFW  Prenatal History/Complications:  Patient Active Problem List   Diagnosis Date Noted   GDM (gestational diabetes mellitus) 03/11/2022   History of herpes genitalis 03/05/2022   History of shoulder dystocia in prior pregnancy, currently pregnant in third trimester 03/05/2022   Asthma affecting pregnancy in third trimester 01/07/2022   Preexisting diabetes complicating pregnancy in second trimester, antepartum 10/23/2021   GBS bacteriuria 10/07/2021   Supervision of high risk pregnancy, antepartum 09/26/2021   Psychosocial stressors 09/26/2021   Unwanted fertility 12/08/2017   History of genetic disease 10/13/2017   Biological false positive RPR test 12/06/2016   Hashimoto's thyroiditis 10/08/2012   Obesity 10/08/2012   Bipolar 1 disorder ([place]) 06/23/2011    Nursing Staff Provider  Office Location  [facility] Dating  LMP c/w 19 wk [place]  PNC Model Valu.Nieves ] Traditional [ ]  Centering [ ]  Mom-Baby Dyad    Language  English  Anatomy US  normal  Flu Vaccine  declined Genetic/Carrier Screen  NIPS:   Low risk AFP:   Not done Horizon: alpha thal  TDaP Vaccine   Given 01/03/22 Hgb A1C or  GTT Early - abnormal, 5.8%>failed 2hr GTT  COVID Vaccine none   LAB RESULTS   Rhogam  n/a Blood Type A/Positive/-- (08/09 1649)   Baby Feeding Plan Breast  Antibody Negative (08/09 1649)  Contraception BTL Rubella 6.32 (08/09 1649)  Circumcision If boy, Yes  RPR Reactive (08/09 1649) > false positive  Pediatrician  Undecided  HBsAg  Negative (08/09 1649)   Support Person unknown HCVAb  negative  Prenatal Classes  HIV Non Reactive (08/09 1649)    BTL Consent  01/03/22 GBS    Positive  urine  VBAC Consent N/a Pap  5/21 normal       DME Rx [ ]  BP cuff [ ]  Weight Scale Waterbirth  [ ]  Class [ ]  Consent [ ]  CNM visit  PHQ9 & GAD7 [ x] new OB [ x ] 28 weeks  [  ] 36 weeks Induction  [ ]  Orders Entered [ ] Foley Y/N    Past Medical History: Past Medical History:  Diagnosis Date   Abscess of right axilla    Anxiety    BV (bacterial vaginosis)    Carpal tunnel syndrome    Depression    GBS (group B Streptococcus carrier), +RV culture, currently pregnant 03/16/2018   GDM (gestational diabetes mellitus)    Gestational diabetes 02/03/2018   Current Diabetic Medications:  None   Required Referrals for A1GDM or A2GDM: [ ]  Diabetes Education and Testing Supplies [ ]  Nutrition Cousult   Baseline and surveillance labs (pulled in from EPIC, refresh links as needed)  Lab Results  Component Value Date   CREATININE 0.96 05/08/2017   AST 32 05/08/2017   ALT 29 05/08/2017   TSH 3.830 11/10/2017   Lab Results  Component Value Date   HGBA1C 5.7 (   HSV-1 (herpes simplex virus 1) infection    Hx of Hashimoto thyroiditis  Hypothyroid     Past Surgical History: Past Surgical History:  Procedure Laterality Date   NO PAST SURGERIES      Obstetrical History: OB History     Gravida  4   Para  3   Term  3   Preterm  0   AB      Living  3      SAB      IAB      Ectopic      Multiple  0   Live Births  3           Social History Social History   Socioeconomic History   Marital status: Single    Spouse name: Not on file   Number of children: Not on file   Years of education: Not on file   Highest education level: Not on file  Occupational History   Not on file  Tobacco Use   Smoking status: Former    Types: Cigarettes    Quit date: 03/18/2013    Years since quitting: 8.9   Smokeless tobacco: Never   Vaping Use   Vaping Use: Former   Substances: Nicotine  Substance and Sexual Activity   Alcohol use: Not Currently   Drug use: Not Currently    Types: Marijuana    Comment: last used January 2023   Sexual activity: Not Currently    Birth control/protection: None  Other Topics Concern   Not on file  Social History Narrative   Not on file   Social Determinants of Health   Financial Resource Strain: Not on file  Food Insecurity: No Food Insecurity (03/11/2022)   Hunger Vital Sign    Worried About Running Out of Food in the Last Year: Never true    [place] in the Last Year: Never true  Transportation Needs: No Transportation Needs (03/11/2022)   PRAPARE - Hydrologist (Medical): No    Lack of Transportation (Non-Medical): No  Physical Activity: Not on file  Stress: Not on file  Social Connections: Not on file    Family History: Family History  Problem Relation Age of Onset   Asthma Sister    Thyroid disease Sister    Cancer Paternal Uncle    Stroke Maternal Grandmother    Diabetes Maternal Grandmother    Hypertension Maternal Grandmother    Cancer Other    Hearing loss Neg Hx     Allergies: Allergies  Allergen Reactions   Milk-Related Compounds Diarrhea    Medications Prior to Admission  Medication Sig Dispense Refill Last Dose   albuterol (VENTOLIN HFA) 108 (90 Base) MCG/ACT inhaler Inhale 1-2 puffs into the lungs every 4 (four) hours as needed for wheezing or shortness of breath. 126 g 2 Past Week   levothyroxine (SYNTHROID) 137 MCG tablet Take 1 tablet (137 mcg total) by mouth daily before breakfast. 90 tablet 1 03/11/2022   pantoprazole (PROTONIX) 40 MG tablet Take 1 tablet (40 mg total) by mouth 2 (two) times daily before a meal. 60 tablet 2 03/11/2022   Prenatal Vit-Fe Fumarate-FA (PRENATAL VITAMIN) 27-0.8 MG TABS Take 1 tablet by mouth daily. 30 tablet 11 03/11/2022   valACYclovir (VALTREX) 500 MG tablet Take 1 tablet (500 mg  total) by mouth daily. 30 tablet 11 03/11/2022   aspirin EC 81 MG tablet Take 1 tablet (81 mg total) by mouth daily. Swallow whole. (Patient not taking: Reported on 02/05/2022) 30 tablet 12 Unknown   Blood  Glucose Monitoring Suppl (ACCU-CHEK GUIDE ME) w/Device KIT 1 Device by Does not apply route 4 (four) times daily. (Patient not taking: Reported on 02/19/2022) 1 kit 0 Unknown   Blood Glucose Monitoring Suppl (ACCU-CHEK GUIDE) w/Device KIT 1 Device by Does not apply route 4 (four) times daily. (Patient not taking: Reported on 11/04/2021) 1 kit 0 Unknown   Continuous Blood Gluc Sensor (DEXCOM G6 SENSOR) MISC Change Dexcom G6 sensor every 10 days. (Patient not taking: Reported on 12/03/2021) 3 each 11 Unknown   Continuous Blood Gluc Transmit (DEXCOM G6 TRANSMITTER) MISC 1 Device by Does not apply route every 3 (three) months. (Patient not taking: Reported on 12/03/2021) 1 each 3 Unknown   famotidine (PEPCID) 20 MG tablet Take 1 tablet (20 mg total) by mouth at bedtime. (Patient not taking: Reported on 02/19/2022) 30 tablet 1 More than a month     Review of Systems   All systems reviewed and negative except as stated in HPI  Blood pressure 117/79, pulse 92, temperature 97.9 F (36.6 C), temperature source Oral, resp. rate 17, last menstrual period 06/24/2021, SpO2 100 %. General appearance: alert, cooperative, and appears stated age Lungs: clear to auscultation bilaterally Heart: regular rate and rhythm Abdomen: soft, non-tender; bowel sounds normal Pelvic: no lesions Extremities: Homans sign is negative, no sign of DVT Presentation: cephalic Fetal monitoring Baseline: 135 bpm, Variability: Good {> 6 bpm), Accelerations: Reactive, and Decelerations: Absent Uterine activityFrequency: Every 10 minutes     Prenatal labs: ABO, Rh: --/--/A POS (01/16 0730) Antibody: NEG (01/16 0730) Rubella: 6.32 (08/09 1649) RPR: Reactive (12/13 1034)  HBsAg: Negative (08/09 1649)  HIV: Non Reactive (12/13  1034)  GBS:   POS 1 hr Glucola GDM Genetic screening  nml Anatomy US nml  Prenatal Transfer Tool  Maternal Diabetes: Yes:  Diabetes Type:  Insulin/Medication controlled Genetic Screening: Normal Maternal Ultrasounds/Referrals: Normal Fetal Ultrasounds or other Referrals:  None Maternal Substance Abuse:  No Significant Maternal Medications:  None Significant Maternal Lab Results:  Group B Strep positive Number of Prenatal Visits:greater than 3 verified prenatal visits Other Comments:  None  Results for orders placed or performed during the hospital encounter of 03/11/22 (from the past 24 hour(s))  Type and screen   Collection Time: 03/11/22  7:30 AM  Result Value Ref Range   ABO/RH(D) A POS    Antibody Screen NEG    Sample Expiration      03/14/2022,2359 Performed at [facility], [address], [place], [place] [address]   CBC   Collection Time: 03/11/22  7:32 AM  Result Value Ref Range   WBC 5.9 4.0 - 10.5 K/uL   RBC 4.41 3.87 - 5.11 MIL/uL   Hemoglobin 11.2 (L) 12.0 - 15.0 g/dL   HCT 57.3 (L) 22.0 - 25.4 %   MCV 78.5 (L) 80.0 - 100.0 fL   MCH 25.4 (L) 26.0 - 34.0 pg   MCHC 32.4 30.0 - 36.0 g/dL   RDW 27.0 (H) 62.3 - 76.2 %   Platelets 193 150 - 400 K/uL   nRBC 0.0 0.0 - 0.2 %  Protein / creatinine ratio, urine   Collection Time: 03/11/22  7:32 AM  Result Value Ref Range   Creatinine, Urine 150 mg/dL   Total Protein, Urine 20 mg/dL   Protein Creatinine Ratio 0.13 0.00 - 0.15 mg/mg[Cre]  Comprehensive metabolic panel   Collection Time: 03/11/22  7:32 AM  Result Value Ref Range   Sodium 137 135 - 145 mmol/L  Potassium 4.1 3.5 - 5.1 mmol/L   Chloride 105 98 - 111 mmol/L   CO2 22 22 - 32 mmol/L   Glucose, Bld 102 (H) 70 - 99 mg/dL   BUN 5 (L) 6 - 20 mg/dL   Creatinine, Ser 4.43 0.44 - 1.00 mg/dL   Calcium 8.9 8.9 - 15.4 mg/dL   Total Protein 6.0 (L) 6.5 - 8.1 g/dL   Albumin 2.6 (L) 3.5 - 5.0 g/dL   AST 20 15 - 41 U/L   ALT 17 0 - 44 U/L   Alkaline  Phosphatase 147 (H) 38 - 126 U/L   Total Bilirubin 0.3 0.3 - 1.2 mg/dL   GFR, Estimated >00 >86 mL/min   Anion gap 10 5 - 15    Patient Active Problem List   Diagnosis Date Noted   GDM (gestational diabetes mellitus) 03/11/2022   History of herpes genitalis 03/05/2022   History of shoulder dystocia in prior pregnancy, currently pregnant in third trimester 03/05/2022   Asthma affecting pregnancy in third trimester 01/07/2022   Preexisting diabetes complicating pregnancy in second trimester, antepartum 10/23/2021   GBS bacteriuria 10/07/2021   Supervision of high risk pregnancy, antepartum 09/26/2021   Psychosocial stressors 09/26/2021   Unwanted fertility 12/08/2017   History of genetic disease 10/13/2017   Biological false positive RPR test 12/06/2016   Hashimoto's thyroiditis 10/08/2012   Obesity 10/08/2012   Bipolar 1 disorder ([facility]) 06/23/2011    Assessment/Plan:  Alyssa Hartman is a 27 y.o. [id number] at [redacted]w[redacted]d here for IOL A2GDM, h/o Shoulder Dystocia requiring physical therapy for baby  #Labor: Discussed risk of shoulder dystocia in this delivery since this baby feels bigger than her last vaginal delivery. Her last SD was complicated by baby requiring physical therapy after birth. Maneuvers included McRoberts, suprapubic pressure. Pt agreed to undergo C-section given her history of SD.  [name] has agreed to proceed with cesarean section due to  H/o SD complication . The risks of surgery were discussed with the patient including but were not limited to: bleeding which may require transfusion or reoperation; infection which may require antibiotics; injury to bowel, bladder, ureters or other surrounding organs; injury to the fetus; need for additional procedures including hysterectomy in the event of a life-threatening hemorrhage; formation of adhesions; placental abnormalities wth subsequent pregnancies; incisional problems; thromboembolic phenomenon and other  postoperative/anesthesia complications. The patient concurred with the proposed plan, giving informed written consent for the procedure. Patient has been NPO for 12 hours she will remain NPO for procedure. Anesthesia and OR aware. Preoperative prophylactic antibiotics and SCDs ordered on call to the OR. To OR when ready.   #Pain: Epidural #FWB: CAT 1 #ID:  GBS pos- PCN #MOF: Breast and bottle #MOC: BTL  Lahoma Crocker Mercado-Ortiz, DO  03/11/2022, 9:35 AM

## 2022-03-11 NOTE — Discharge Summary (Signed)
Postpartum Discharge Summary      Patient Name: Alyssa Hartman DOB: [birth year] MRN: [id number]  Date of admission: 03/11/2022 Delivery date:03/11/2022  Delivering provider: Truett Mainland  Date of discharge: 03/14/2022  Admitting diagnosis: GDM (gestational diabetes mellitus) [O24.419] Intrauterine pregnancy: [redacted]w[redacted]d     Secondary diagnosis:  Principal Problem:   Status post primary low transverse cesarean section Active Problems:   Hashimoto's thyroiditis   Obesity   Supervision of high risk pregnancy, antepartum   GBS bacteriuria   Asthma affecting pregnancy in third trimester   History of shoulder dystocia in prior pregnancy, currently pregnant in third trimester   GDM (gestational diabetes mellitus)   Status post bilateral salpingectomy  Additional problems: None    Discharge diagnosis: Term Pregnancy Delivered                                              Post partum procedures: None  Augmentation: N/A Complications: None  Hospital course: Induction of Labor With Cesarean Section   27 y.o. yo [id number] at [redacted]w[redacted]d was admitted to the hospital 03/11/2022 for induction of labor. Patient had a labor course significant for no augmentation due to discussion of history of shoulder dystocia led to decision for C/s. The patient went for cesarean section due to  history of shoulder dystocia with complication of baby requiring physical therapy afterwards . Delivery details are as follows: Membrane Rupture Time/Date:  ,   Delivery Method:C-Section, Low Transverse  Details of operation can be found in separate operative Note.  Patient had a uncomplicated postpartum course. Post op hemoglobin 9.1. She is ambulating, tolerating a regular diet, passing flatus, and urinating well.  Patient is discharged home in stable condition on 03/14/22.      Newborn Data: Birth date:03/11/2022  Birth time:11:21 AM  Gender:Female  Living status:Living  Apgars:8 ,9  Weight:3510 g                                 Magnesium Sulfate received: No BMZ received: No Rhophylac:N/A MMR:N/A T-DaP:Given prenatally Flu: No Transfusion:No  Physical exam  Vitals:   03/13/22 0629 03/13/22 1515 03/13/22 1607 03/14/22 0530  BP: (!) 103/57 (!) 123/112 132/67 (!) 104/58  Pulse: 86 89  92  Resp: 18 19  16   Temp: 98.3 F (36.8 C) 98 F (36.7 C)  98.2 F (36.8 C)  TempSrc: Oral Oral  Oral  SpO2: 100%   100%   General: alert, cooperative, and no distress Lochia: appropriate Uterine Fundus: firm Incision: Healing well with no significant drainage, dressing dirty, to be replaced prior to DC DVT Evaluation: No evidence of DVT seen on physical exam. Labs: Lab Results  Component Value Date   WBC 10.8 (H) 03/12/2022   HGB 9.1 (L) 03/12/2022   HCT 28.5 (L) 03/12/2022   MCV 79.4 (L) 03/12/2022   PLT 187 03/12/2022      Latest Ref Rng & Units 03/11/2022   12:08 PM  CMP  Sodium 135 - 145 mmol/L 138   Potassium 3.5 - 5.1 mmol/L 4.2    Edinburgh Score:    03/11/2022    1:45 PM  Edinburgh Postnatal Depression Scale Screening Tool  I have been able to laugh and see the funny side of things. 0  I have looked forward with enjoyment to things.  0  I have blamed myself unnecessarily when things went wrong. 1  I have been anxious or worried for no good reason. 2  I have felt scared or panicky for no good reason. 1  Things have been getting on top of me. 2  I have been so unhappy that I have had difficulty sleeping. 1  I have felt sad or miserable. 1  I have been so unhappy that I have been crying. 1  The thought of harming myself has occurred to me. 0  Edinburgh Postnatal Depression Scale Total 9     After visit meds:  Allergies as of 03/14/2022   No Known Allergies      Medication List     STOP taking these medications    Accu-Chek Guide Me w/Device Kit   Accu-Chek Guide w/Device Kit   aspirin EC 81 MG tablet   Dexcom G6 Sensor Misc   Dexcom G6 Transmitter Misc   valACYclovir 500 MG  tablet Commonly known as: Valtrex       TAKE these medications    acetaminophen 500 MG tablet Commonly known as: TYLENOL Take 2 tablets (1,000 mg total) by mouth every 6 (six) hours.   famotidine 20 MG tablet Commonly known as: PEPCID Take 1 tablet (20 mg total) by mouth at bedtime.   ferrous sulfate 325 (65 FE) MG tablet Take 1 tablet (325 mg total) by mouth every other day.   ibuprofen 600 MG tablet Commonly known as: ADVIL Take 1 tablet (600 mg total) by mouth every 6 (six) hours.   levothyroxine 137 MCG tablet Commonly known as: Synthroid Take 1 tablet (137 mcg total) by mouth daily before breakfast.   oxyCODONE 5 MG immediate release tablet Commonly known as: Oxy IR/ROXICODONE Take 1 tablet (5 mg total) by mouth every 4 (four) hours as needed for moderate pain.   pantoprazole 40 MG tablet Commonly known as: Protonix Take 1 tablet (40 mg total) by mouth 2 (two) times daily before a meal.   Prenatal Vitamin 27-0.8 MG Tabs Take 1 tablet by mouth daily.   Ventolin HFA 108 (90 Base) MCG/ACT inhaler Generic drug: albuterol Inhale 1-2 puffs into the lungs every 4 (four) hours as needed for wheezing or shortness of breath.         Discharge home in stable condition Infant Feeding: Breast Infant Disposition:home with mother Discharge instruction: per After Visit Summary and Postpartum booklet. Activity: Advance as tolerated. Pelvic rest for 6 weeks.  Diet: routine diet Future Appointments: Future Appointments  Date Time Provider [place]  03/18/2022 10:00 AM [facility] NURSE [facility]  03/25/2022  8:15 AM [facility]  04/22/2022 10:55 AM Clarnce Flock, MD [facility]   Follow up Visit: Message sent to [facility] on 1/16  Please schedule this patient for a In person postpartum visit in 6 weeks with the following provider: Any provider. Additional Postpartum F/U:Postpartum Depression checkup, 2 hour GTT, Incision check 1  week, and TSH follow up   High risk pregnancy complicated by: GDM Delivery mode:  C-Section, Low Transverse  Anticipated Birth Control:  BTL done PP   03/14/2022 Concepcion Living, MD

## 2022-03-11 NOTE — Transfer of Care (Signed)
Immediate Anesthesia Transfer of Care Note  Patient: Alyssa Hartman  Procedure(s) Performed: CESAREAN SECTION  Patient Location: PACU  Anesthesia Type:Spinal  Level of Consciousness: awake  Airway & Oxygen Therapy: Patient Spontanous Breathing  Post-op Assessment: Report given to RN  Post vital signs: Reviewed and stable  Last Vitals:  Vitals Value Taken Time  BP    Temp    Pulse 81 03/11/22 1218  Resp    SpO2 100 % 03/11/22 1218  Vitals shown include unvalidated device data.  Last Pain:  Vitals:   03/11/22 0849  TempSrc:   PainSc: 4       Patients Stated Pain Goal: 0 (03/50/09 3818)  Complications: No notable events documented.

## 2022-03-11 NOTE — Anesthesia Procedure Notes (Signed)
Spinal  Patient location during procedure: OR Start time: 03/11/2022 10:43 AM End time: 03/11/2022 10:49 AM Reason for block: surgical anesthesia Staffing Performed: anesthesiologist  Anesthesiologist: Pervis Hocking, DO Performed by: Pervis Hocking, DO Authorized by: Pervis Hocking, DO   Preanesthetic Checklist Completed: patient identified, IV checked, risks and benefits discussed, surgical consent, monitors and equipment checked, pre-op evaluation and timeout performed Spinal Block Patient position: sitting Prep: DuraPrep and site prepped and draped Patient monitoring: cardiac monitor, continuous pulse ox and blood pressure Approach: midline Location: L3-4 Injection technique: single-shot Needle Needle type: Pencan  Needle gauge: 24 G Needle length: 9 cm Assessment Sensory level: T6 Events: CSF return Additional Notes Functioning IV was confirmed and monitors were applied. Sterile prep and drape, including hand hygiene and sterile gloves were used. The patient was positioned and the spine was prepped. The skin was anesthetized with lidocaine.  Free flow of clear CSF was obtained prior to injecting local anesthetic into the CSF.  The spinal needle aspirated freely following injection.  The needle was carefully withdrawn.  The patient tolerated the procedure well.

## 2022-03-12 ENCOUNTER — Ambulatory Visit: Payer: Medicaid Other

## 2022-03-12 LAB — GLUCOSE, CAPILLARY: Glucose-Capillary: 76 mg/dL (ref 70–99)

## 2022-03-12 LAB — CBC
HCT: 28.5 % — ABNORMAL LOW (ref 36.0–46.0)
Hemoglobin: 9.1 g/dL — ABNORMAL LOW (ref 12.0–15.0)
MCH: 25.3 pg — ABNORMAL LOW (ref 26.0–34.0)
MCHC: 31.9 g/dL (ref 30.0–36.0)
MCV: 79.4 fL — ABNORMAL LOW (ref 80.0–100.0)
Platelets: 187 10*3/uL (ref 150–400)
RBC: 3.59 MIL/uL — ABNORMAL LOW (ref 3.87–5.11)
RDW: 17.4 % — ABNORMAL HIGH (ref 11.5–15.5)
WBC: 10.8 10*3/uL — ABNORMAL HIGH (ref 4.0–10.5)
nRBC: 0 % (ref 0.0–0.2)

## 2022-03-12 LAB — T.PALLIDUM AB, TOTAL: T Pallidum Abs: NONREACTIVE

## 2022-03-12 MED ORDER — HYDROXYZINE HCL 25 MG PO TABS
50.0000 mg | ORAL_TABLET | Freq: Three times a day (TID) | ORAL | Status: DC
  Administered 2022-03-12 (×2): 50 mg via ORAL
  Filled 2022-03-12 (×2): qty 2

## 2022-03-12 MED ORDER — HYDROXYZINE HCL 25 MG PO TABS: 25.0000 mg | ORAL_TABLET | Freq: Three times a day (TID) | ORAL | Status: DC | PRN

## 2022-03-12 MED ORDER — SODIUM CHLORIDE 0.9 % IV SOLN
500.0000 mg | Freq: Once | INTRAVENOUS | Status: AC
  Administered 2022-03-12: 500 mg via INTRAVENOUS
  Filled 2022-03-12: qty 500

## 2022-03-12 NOTE — Clinical Social Work Maternal (Signed)
CLINICAL SOCIAL WORK MATERNAL/CHILD NOTE  Patient Details  Name: Alyssa Hartman MRN: [id number] Date of Birth: 10-30-1995  Date:  03/12/2022  Clinical Social Worker Initiating Note:  Alyssa Hartman Date/Time: Initiated:  03/12/22/1430     Child's Name:  Alyssa Hartman   Biological Parents:  Mother, Father Alyssa Hartman 07/15/95, Alyssa Hartman 08/26/1993)   Need for Interpreter:  None   Reason for Referral:  Behavioral Health Concerns   Address:  [place] [address]    Phone number:  [phone] (home)     Additional phone number:   Household Members/Support Persons (HM/SP):    (resident at [facility])   HM/SP Name Relationship DOB or Age  HM/SP -1        HM/SP -2        HM/SP -3        HM/SP -4        HM/SP -5        HM/SP -6        HM/SP -7        HM/SP -8          Natural Supports (not living in the home):  Immediate Family   Professional Supports: Shelter ([facility])   Employment: Unemployed   Type of Work:     Education:  9 to 11 years   Homebound arranged:    Museum/gallery curator Resources:  Medicaid   Other Resources:  [facility]   Cultural/Religious Considerations Which May Impact Care:    Strengths:  Home prepared for child     Psychotropic Medications:         Pediatrician:       Pediatrician List:   [place]      Pediatrician Fax Number:    Risk Factors/Current Problems:  Basic Needs  , Mental Health Concerns  , Transportation     Cognitive State:  Alert  , Able to Concentrate     Mood/Affect:  Comfortable  , Calm     CSW Assessment: CSW received consult for hx Anxiety, depression. Bipolar, and custody of children. CSW met with [name] to offer support and complete assessment. CSW entered the room and observed [name] getting back in bed and the infant lying in the bassinet. CSW inquired about how [name] was feeling, [name]  feeling a little pain from the csection. CSW encouraged [name] to notify her nurse so she could stay on top of the pain.   CSW inquired about [name] MH hx, [name] she was diagnosed at age 27. [name] she she does  not take medication but is interested in getting on medication. CSW encouraged [name] to notify her OB, PCP and/or  reach out the therapist listed on the resource list provided. [name] was agreeable. [name] have no recent symptoms of Anxiety Depression or Bipolar. CSW assessed for safety, [name] denied any SI, HI or DV. CSW provided education regarding the baby blues period vs. perinatal mood disorders, discussed treatment and gave resources for mental health follow up if concerns arise.  CSW recommends self-evaluation during the postpartum time period using the New Mom Checklist from Postpartum Progress and encouraged [name] to contact a medical professional if symptoms are noted at any time. CSW inquired about [name] supports, [name] her sister and the [facility] staff are her supports.  [name] she also receives [facility]. CSW encouraged [name] to call and get the infant added to her benefits. [name] scheduled a visit with [facility] for 1/31 @1pm .  CSW inquired about [name] 3 other children, [name] the oldest 2 (Alyssa Hartman 12/04/2016 and Alyssa Hartman 01/21/2016)stay with their paternal grandmother. Paternal Grandmother has custody due to CPS involvement. [name] Alyssa Hartman stays with a friend but no CPS involvement with this child. [name] reports she has no open CPS cases at this time.    CSW provided review of Sudden Infant Death Syndrome (SIDS) precautions. [name] she has all necessary items for the infant including a bassinet for him to sleep. CSW identifies no further need for intervention and no barriers to discharge at this time.  CSW Plan/Description:  No Further Intervention Required/No Barriers to Discharge, Psychosocial Support and  Ongoing Assessment of Needs, Perinatal Mood and Anxiety Disorder (PMADs) Education, Sudden Infant Death Syndrome (SIDS) Education, Other Information/Referral to [employer], LCSW 03/12/2022, 9:57 PM

## 2022-03-12 NOTE — Progress Notes (Addendum)
POSTPARTUM PROGRESS NOTE  Post Partum Day 1  Subjective:  Alyssa Hartman is a 27 y.o. [id number] pregnancy complicated by GDMA1, history of HSV (valtrex) and hashimoto (synthroid) s/p pLTCS for h/p SD complication at [redacted]w[redacted]d.  No acute events overnight.  Pt denies problems with ambulating, voiding or po intake.  She denies nausea or vomiting.  Pain is well controlled.  She has had flatus. She has not had bowel movement.  Lochia Small. Pt notes feeling very itching all over that got worse today.   Objective: Blood pressure (!) 113/56, pulse 87, temperature 98.4 F (36.9 C), temperature source Oral, resp. rate 16, last menstrual period 06/24/2021, SpO2 100 %, unknown if currently breastfeeding.  Physical Exam:  General: alert, cooperative and no distress Chest: no respiratory distress Heart:regular rate, distal pulses intact Abdomen: soft, nontender,  Uterine Fundus: firm, appropriately tender DVT Evaluation: No calf swelling or tenderness Extremities: trace edema Skin: warm, dry, incision appears appropriate under honey comb   Recent Labs    03/11/22 1208 03/12/22 0341  HGB 9.5* 9.1*  HCT 28.0* 28.5*    Assessment/Plan: Alyssa Hartman is a 27 y.o. A2N0539 pregnancy complicated by GDMA1, history of HSV (valtrex) and hashimoto (synthroid) s/p pLTCS for h/o SD complication s/p  at [redacted]w[redacted]d   PPD#1 - Doing well, hemoglobin decreased from 9.5 yesterday to 9.1 today s/p c/s. Microcytic. Will administer iron. Itching: will administer hydroxyzine prn  Contraception: s/p BTL Feeding: breast Dispo: Plan for discharge possibly tomorrow or the  day after that .   LOS: 1 day   Abram Sander, MD PGY1 03/12/2022, 7:14 AM

## 2022-03-12 NOTE — Lactation Note (Signed)
This note was copied from a baby's chart. Lactation Consultation Note  Patient Name: Alyssa Hartman [id number] Date: 03/12/2022   Age:28 hours Attempted to see mom but she was sleeping soundly.  Maternal Data    Feeding    LATCH Score                    Lactation Tools Discussed/Used    Interventions    Discharge    Consult Status      Theodoro Kalata 03/12/2022, 11:23 PM

## 2022-03-13 ENCOUNTER — Encounter: Payer: Medicaid Other | Admitting: Obstetrics and Gynecology

## 2022-03-13 LAB — SURGICAL PATHOLOGY

## 2022-03-13 MED ORDER — FERROUS SULFATE 325 (65 FE) MG PO TABS
325.0000 mg | ORAL_TABLET | Freq: Every day | ORAL | Status: DC
  Administered 2022-03-14: 325 mg via ORAL
  Filled 2022-03-13: qty 1

## 2022-03-13 NOTE — Progress Notes (Signed)
POSTPARTUM PROGRESS NOTE  Post Partum Day 2  Subjective:  Alyssa Hartman is a 27 y.o. Q6P6195 s/p pLTCS at [redacted]w[redacted]d. H/o SD complication, A1 gestational DM, Hashimoto's, and HSV.  No acute events overnight. Pt denies problems with ambulating, voiding or po intake.  She denies nausea or vomiting.  Pain is well controlled.  She has had flatus. She has not had bowel movement.  Lochia Minimal. Confirms that itching has resolved.  Objective: Blood pressure (!) 103/57, pulse 86, temperature 98.3 F (36.8 C), temperature source Oral, resp. rate 18, last menstrual period 06/24/2021, SpO2 100 %, unknown if currently breastfeeding.  Physical Exam:  General: alert, cooperative and no distress Chest: no respiratory distress Heart:regular rate, distal pulses intact Abdomen: soft, nontender,  Uterine Fundus: firm, appropriately tender DVT Evaluation: No calf swelling or tenderness Extremities: minimal edema Skin: warm, dry; incision clean/dry/intact  Recent Labs    03/11/22 1208 03/12/22 0341  HGB 9.5* 9.1*  HCT 28.0* 28.5*    Assessment/Plan: Alyssa Hartman is a 27 y.o. K9T2671 pregnancy complicated by GDMA1, history of HSV and Hashimoto at [redacted]w[redacted]d s/p pLTCS for h/o SD complication at [redacted]w[redacted]d.  PPD#2 - Doing well. Itching has resolved. Contraception: s/p BTL Feeding: breast Dispo: Plan for discharge today. Patient is comfortable with this once baby's circumcision is done.   LOS: 2 days   Angelique Holm, MS3 03/13/2022, 8:34 AM

## 2022-03-13 NOTE — Progress Notes (Signed)
Alyssa Hartman 05-Jun-1995 [id number]   CIRCUMCISION CONSENT  Patient and SO expresses desire for infant circumcision.  Informed that [facility] can perform said procedure and circumcision procedure details discussed.    -It was emphasized that this is an elective procedure.   -Risks and benefits of procedure were reviewed including, but not limited to:  *Benefits include reduction in the rates of urinary tract infection (UTI), penile cancer, some sexually transmitted infections, penile inflammatory, and retractile disorders, as well as easier hygiene.   *Risks include bleeding, infection, injury of glans which may lead to need for additional surgery, penile deformity, or urinary tract issues, unsatisfactory cosmetic appearance and other potential complications related to the procedure.   -Informed that procedure will not be performed if provider deems inappropriate d/t penile size, noted deformity, or unsatisfactory pediatric evaluation. -Patient wants to proceed with circumcision. -Circumcision to be done pending pediatric evaluation of infant.  -Post circumcision care discussed. -L&D Team updated  Maryann Conners MSN, CNM Advanced Practice Provider, [facility] [place] 03/13/2022 9:44 AM

## 2022-03-13 NOTE — Lactation Note (Addendum)
This note was copied from a baby's chart. Lactation Consultation Note  Patient Name: Alyssa Hartman [id number] Date: 03/13/2022 Reason for consult: Follow-up assessment Age:27 hours  P4: Early term infant at 37+1 weeks Feeding preference: Breast milk (Mother will pump and bottle feed)                                   Formula supplementation as needed Weight loss: 2%  Mother had no questions/concerns at this time.  She reported that she has last pumped "2 ounces" of milk.  Baby has been consuming adequate volumes breast milk/formula.  She has no difficulty formula feeding.  Mother had no questions on pumping.  She did not wish to review engorgement prevention/treatment.  She has our op phone number for any concerns after discharge.  No support person present at this time.  Baby asleep in the bassinet.  Mother reports baby has been doing well since the fall he had earlier in the night.   Maternal Data    Feeding Nipple Type: Other (personal Dr. Roosvelt Harps bottle)  LATCH Score                    Lactation Tools Discussed/Used    Interventions    Discharge Discharge Education: Engorgement and breast care Pump: Personal  Consult Status Consult Status: Complete Date: 03/13/22 Follow-up type: Call as needed    Emanual Lamountain R Denielle Bayard 03/13/2022, 7:10 AM

## 2022-03-14 ENCOUNTER — Other Ambulatory Visit (HOSPITAL_COMMUNITY): Payer: Self-pay

## 2022-03-14 MED ORDER — FERROUS SULFATE 325 (65 FE) MG PO TABS
325.0000 mg | ORAL_TABLET | ORAL | 3 refills | Status: AC
  Filled 2022-03-14: qty 60, 120d supply, fill #0

## 2022-03-14 MED ORDER — IBUPROFEN 600 MG PO TABS
600.0000 mg | ORAL_TABLET | Freq: Four times a day (QID) | ORAL | 0 refills | Status: AC
  Filled 2022-03-14: qty 30, 8d supply, fill #0

## 2022-03-14 MED ORDER — OXYCODONE HCL 5 MG PO TABS
5.0000 mg | ORAL_TABLET | ORAL | 0 refills | Status: AC | PRN
  Filled 2022-03-14: qty 20, 4d supply, fill #0

## 2022-03-14 MED ORDER — ACETAMINOPHEN 500 MG PO TABS
1000.0000 mg | ORAL_TABLET | Freq: Four times a day (QID) | ORAL | 0 refills | Status: AC
  Filled 2022-03-14: qty 30, 4d supply, fill #0

## 2022-03-14 NOTE — Social Work (Signed)
CSW made CPS report to [place] regarding [name] sleeping with the infant in bed and infants fall.   Alyssa Hartman, [place] Social Worker [phone]

## 2022-03-14 NOTE — Social Work (Signed)
CSW escorted CPS SW Johnson to [name] room.  Keelin Neville, LCSWA Clinical Social Worker [phone] 

## 2022-03-14 NOTE — Lactation Note (Signed)
This note was copied from a baby's chart. Lactation Consultation Note  Patient Name: Boy Kairy Folsom [id number] Date: 03/14/2022 Reason for consult: Follow-up assessment Age:27 hours   LC Note:  Staff member requested my assistance.  Mother desired assistance with bottle feeding baby.  She has been pumping and bottle feeding and obtaining 2-3 ounces.  Baby has been consuming 30-60 mls/feeding; voiding/stooling.  Mother stated that her arm "hurts" and sometimes baby will not open his mouth for feedings.  Overall, his volume is good; fed him an additional 30 mls easily; burped well.  Mother observed; placed him in the bassinet where he fell asleep.     Maternal Data    Feeding    LATCH Score                    Lactation Tools Discussed/Used    Interventions    Discharge    Consult Status Consult Status: Complete Date: 03/14/22 Follow-up type: Call as needed    Dezirea Mccollister R Khadijah Mastrianni 03/14/2022, 5:46 AM

## 2022-03-18 ENCOUNTER — Ambulatory Visit (INDEPENDENT_AMBULATORY_CARE_PROVIDER_SITE_OTHER): Payer: Medicaid Other

## 2022-03-18 ENCOUNTER — Other Ambulatory Visit: Payer: Self-pay

## 2022-03-18 VITALS — BP 124/76 | HR 67 | Ht 62.0 in | Wt 257.0 lb

## 2022-03-18 DIAGNOSIS — E063 Autoimmune thyroiditis: Secondary | ICD-10-CM

## 2022-03-18 NOTE — BH Specialist Note (Signed)
Pt did not arrive to video visit and did not answer the phone; unable to leave message; left MyChart message for patient.

## 2022-03-18 NOTE — Progress Notes (Signed)
Incision Check Visit  Alyssa Hartman is here for incision check following primary c-section on 03/11/22.   Assessment: Incision was still covered with honeycomb and adhesive bandage. Bandage and honeycomb removed without complications. Incision was clean, dry, and intact. No signs of infection noted--no redness, swelling, tenderness, or pus noted. Patient reports minor incisional pain throughout the day; pain subsides with use of prescribed pain medication and tylenol.   Education: Reviewed good wound care and s/s of infection with patient.   Patient will follow up post partum visit .  TSH Check  Per discharge note from hospital stay following C/S; Dr. 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 TSH blood draw ordered at visit.     Alinda Money, RN 03/18/2022  8:22 AM

## 2022-03-19 ENCOUNTER — Ambulatory Visit: Payer: Medicaid Other

## 2022-03-19 LAB — TSH: TSH: 5.25 u[IU]/mL — ABNORMAL HIGH (ref 0.450–4.500)

## 2022-03-25 ENCOUNTER — Ambulatory Visit: Payer: Medicaid Other | Admitting: Clinical

## 2022-03-25 DIAGNOSIS — Z91199 Patient's noncompliance with other medical treatment and regimen due to unspecified reason: Secondary | ICD-10-CM

## 2022-03-26 ENCOUNTER — Ambulatory Visit: Payer: Medicaid Other

## 2022-03-31 NOTE — BH Specialist Note (Signed)
Pt did not arrive to video visit and did not answer the phone; Left HIPPA-compliant message to call back Wisam Siefring from [facility] at  [phone] (Jaretssi Kraker's office).  ?; left MyChart message for patient.  ? ?

## 2022-04-03 ENCOUNTER — Telehealth: Payer: Self-pay | Admitting: *Deleted

## 2022-04-03 ENCOUNTER — Encounter: Payer: Self-pay | Admitting: *Deleted

## 2022-04-03 DIAGNOSIS — K5900 Constipation, unspecified: Secondary | ICD-10-CM

## 2022-04-03 MED ORDER — POLYETHYLENE GLYCOL 3350 17 GM/SCOOP PO POWD: 1.0000 | Freq: Once | ORAL | Status: AC

## 2022-04-03 MED ORDER — METAMUCIL 4 IN 1 FIBER 25 % PO PACK: 1.0000 | PACK | ORAL | 0 refills | Status: DC | PRN

## 2022-04-03 MED ORDER — DOCUSATE SODIUM 100 MG PO CAPS: 100.0000 mg | ORAL_CAPSULE | Freq: Two times a day (BID) | ORAL | 0 refills | Status: DC

## 2022-04-03 NOTE — Telephone Encounter (Signed)
Patient sent message re: being constipated. I called and she reports being constipated for 2 days . States home visit nurse said eat more fiber.  I discussed with her making sure she is drinking at least 8 glasses of water a day, eating fresh vegetable/fruit/ foods with fiber. I recommended per protocol colace bid, metamucil daily, and miralax if first 2 do not work. She asked if I will send in RX because the maternity home says she must have rx.  I informed her I will, but her insurance may not cover them because they are otc meds. She voices understanding. [name]

## 2022-04-08 MED ORDER — DOCUSATE SODIUM 100 MG PO CAPS: 100.0000 mg | ORAL_CAPSULE | Freq: Two times a day (BID) | ORAL | 0 refills | Status: AC

## 2022-04-08 MED ORDER — METAMUCIL 4 IN 1 FIBER 25 % PO PACK: 1.0000 | PACK | ORAL | 0 refills | Status: AC | PRN

## 2022-04-09 ENCOUNTER — Ambulatory Visit: Payer: Medicaid Other | Admitting: Clinical

## 2022-04-09 DIAGNOSIS — Z91199 Patient's noncompliance with other medical treatment and regimen due to unspecified reason: Secondary | ICD-10-CM

## 2022-04-11 NOTE — BH Specialist Note (Signed)
Integrated Behavioral Health via Telemedicine Visit  04/22/2022 Alyssa Hartman [id number]  Number of [place] Clinician visits: 1- Initial Visit  Session Start time: [id number]   Session End time: [id number]  Total time in minutes: 40   Referring Provider: Manya Silvas, CNM Patient/Family location: Home/Maternity home [facility] Provider location: [facility] [employer] [facility]  All persons participating in visit: Patient Alyssa Hartman and [place]   Types of Service: Individual psychotherapy and Video visit  I connected with [name] and/or [place]  via  Telephone or Video Enabled Telemedicine Application  (Video is Caregility application) and verified that I am speaking with the correct person using two identifiers. Discussed confidentiality: Yes   I discussed the limitations of telemedicine and the availability of in person appointments.  Discussed there is a possibility of technology failure and discussed alternative modes of communication if that failure occurs.  I discussed that engaging in this telemedicine visit, they consent to the provision of behavioral healthcare and the services will be billed under their insurance.  Patient and/or legal guardian expressed understanding and consented to Telemedicine visit: Yes   Presenting Concerns: Patient and/or family reports the following symptoms/concerns: Life stress regarding separation from older children; has not seen them in person in about a year (youngest daughter's temporary guardian is not complying with court order/not allowing pt to see her via face time or phone). Pt's goals are to:  Establish with psychiatry to begin [facility] medication management for Bipolar 1 disorder (last manic episode 4 years prior) and ADHD.  Find more permanent housing once placing out of maternity home.  Duration of problem: Ongoing; Severity of problem:  moderate  Patient and/or Family's Strengths/Protective Factors: Social connections, Concrete supports in place (healthy food, safe environments, etc.), and Sense of purpose  Goals Addressed: Patient will:  Maintain reduction in symptoms of: anxiety, depression, mood instability, and stress   Increase knowledge and/or ability of: healthy habits and stress reduction   Demonstrate ability to: Increase healthy adjustment to current life circumstances, Increase adequate support systems for patient/family, and Increase motivation to adhere to plan of care  Progress towards Goals: Ongoing  Interventions: Interventions utilized:  Solution-Focused Strategies, Psychoeducation and/or Health Education, and Link to [employer] Standardized Assessments completed: GAD-7 and PHQ 9  Patient and/or Family Response: Patient agrees with treatment plan.   Assessment: Patient currently experiencing History of Bipolar 1 disorder and ADHD.   Patient may benefit from psychoeducation and brief therapeutic interventions regarding managing symptoms of depression, anxiety, mood instability and current life stress .  Plan: Follow up with behavioral health clinician on : Three weeks Behavioral recommendations:  -Continue prioritizing healthy self-care (regular meals, adequate rest; allowing practical help from supportive friends and family) until at least postpartum medical appointment -Consider new mom support group as needed at either [email or web address].net or [email or web address]  -Accept referral to psychiatry for [facility] medication management; use [facility] as needed -Continue working with caseworker on obtaining more permanent housing  -Continue spending time with older children as often as able via facetime and phone (keep written record of days/times that other party does not follow court orders for video and phone visitations)  Referral(s): [place] (In Clinic),  [place] (LME/Outside Clinic), and Community Resources:  new parent support  I discussed the assessment and treatment plan with the patient and/or parent/guardian. They were provided an opportunity to ask questions and all  were answered. They agreed with the plan and demonstrated an understanding of the instructions.   They were advised to call back or seek an in-person evaluation if the symptoms worsen or if the condition fails to improve as anticipated.  Caroleen Hamman Kolden Dupee, LCSW    04/22/2022    2:31 PM 12/03/2021    8:59 AM 11/06/2021    5:15 PM 10/31/2021    4:49 PM 10/02/2021    3:02 PM  Depression screen PHQ 2/9  Decreased Interest 1 0 0 2 0  Down, Depressed, Hopeless 1 0 0 0 0  PHQ - 2 Score 2 0 0 2 0  Altered sleeping 0 0  0 1  Tired, decreased energy 3 0  0 0  Change in appetite 0 0  0 0  Feeling bad or failure about yourself  3 0  0 0  Trouble concentrating 1 0  0 0  Moving slowly or fidgety/restless 0 0  0 0  Suicidal thoughts 0 0  0 0  PHQ-9 Score 9 0  2 1      04/22/2022    2:39 PM 12/03/2021    8:59 AM 10/31/2021    4:50 PM 10/02/2021    3:02 PM  GAD 7 : Generalized Anxiety Score  Nervous, Anxious, on Edge 0 0 0 0  Control/stop worrying 1 0 0 0  Worry too much - different things 2 0 0 0  Trouble relaxing 0 0 0 0  Restless 0 0 0 0  Easily annoyed or irritable 1 0 0 1  Afraid - awful might happen 0 0 0 0  Total GAD 7 Score 4 0 0 1

## 2022-04-17 ENCOUNTER — Encounter: Payer: Self-pay | Admitting: General Practice

## 2022-04-17 ENCOUNTER — Telehealth: Payer: Self-pay | Admitting: Family Medicine

## 2022-04-17 NOTE — Telephone Encounter (Signed)
Patient would like called back from a nurse regarding medications

## 2022-04-17 NOTE — Telephone Encounter (Signed)
Addressing patient's concerns via mychart.

## 2022-04-21 NOTE — Progress Notes (Deleted)
    [place] Partum Visit Note  Alyssa Hartman is a 27 y.o. [phone] female who presents for a postpartum visit. She is 5 weeks postpartum following a primary cesarean section.  I have fully reviewed the prenatal and intrapartum course. The delivery was at 37.1 gestational weeks.  Anesthesia: spinal. Postpartum course has been ***. Baby is doing well***. Baby is feeding by breast. Bleeding {vag bleed:12292}. Bowel function is {normal:32111}. Bladder function is {normal:32111}. Patient {is/is not:9024} sexually active. Contraception method is tubal ligation. Postpartum depression screening: {gen negative/positive:315881}.   The pregnancy intention screening data noted above was reviewed. Potential methods of contraception were discussed. The patient elected to proceed with No data recorded.    Health Maintenance Due  Topic Date Due   COVID-19 Vaccine (1) Never done   FOOT EXAM  Never done   OPHTHALMOLOGY EXAM  Never done   HPV VACCINES (2 - 3-dose series) 11/05/2012   INFLUENZA VACCINE  09/24/2021   HEMOGLOBIN A1C  04/04/2022   PAP-Cervical Cytology Screening  04/21/2022   PAP SMEAR-Modifier  04/21/2022    {Common ambulatory SmartLinks:19316}  Review of Systems {ros; complete:30496}  Objective:  LMP 06/24/2021    General:  {gen appearance:16600}   Breasts:  {desc; normal/abnormal/not indicated:14647}  Lungs: {lung exam:16931}  Heart:  {heart exam:5510}  Abdomen: {abdomen exam:16834}   Wound {Wound assessment:11097}  GU exam:  {desc; normal/abnormal/not indicated:14647}       Assessment:    There are no diagnoses linked to this encounter.  *** postpartum exam.   Plan:   Essential components of care per ACOG recommendations:  1.  Mood and well being: Patient with {gen negative/positive:315881} depression screening today. Reviewed local resources for support.  - Patient tobacco use? {tobacco use:25506}  - hx of drug use? {yes/no:25505}    2. Infant care and feeding:   -Patient currently breastmilk feeding? {yes/no:25502}  -Social determinants of health (SDOH) reviewed in EPIC. No concerns***The following needs were identified***  3. Sexuality, contraception and birth spacing - Patient {DOES_DOES NF:2365131 want a pregnancy in the next year.  Desired family size is {NUMBER 1-10:22536} children.  - Reviewed reproductive life planning. Reviewed contraceptive methods based on pt preferences and effectiveness.  Patient desired {Upstream End Methods:24109} today.   - Discussed birth spacing of 18 months  4. Sleep and fatigue -Encouraged family/partner/community support of 4 hrs of uninterrupted sleep to help with mood and fatigue  5. Physical Recovery  - Discussed patients delivery and complications. She describes her labor as {description:25511} - Patient had a {CHL AMB [phone]}. Patient had a {laceration:25518} laceration. Perineal healing reviewed. Patient expressed understanding - Patient has urinary incontinence? {yes/no:25515} - Patient {ACTION; IS/IS VG:[id number] safe to resume physical and sexual activity  6.  Health Maintenance - HM due items addressed {Yes or If no, why not?:20788} - Last pap smear  Diagnosis  Date Value Ref Range Status  04/22/2019   Final   - Negative for intraepithelial lesion or malignancy (NILM)   Pap smear {done:10129} at today's visit.  -Breast Cancer screening indicated? {indicated:25516}  7. Chronic Disease/Pregnancy Condition follow up: {Follow up:25499}  - PCP follow up  [facility] [place]

## 2022-04-22 ENCOUNTER — Ambulatory Visit: Payer: Self-pay | Admitting: Family Medicine

## 2022-04-22 ENCOUNTER — Ambulatory Visit (INDEPENDENT_AMBULATORY_CARE_PROVIDER_SITE_OTHER): Payer: Medicaid Other | Admitting: Clinical

## 2022-04-22 DIAGNOSIS — F319 Bipolar disorder, unspecified: Secondary | ICD-10-CM | POA: Diagnosis not present

## 2022-04-22 DIAGNOSIS — F909 Attention-deficit hyperactivity disorder, unspecified type: Secondary | ICD-10-CM

## 2022-04-22 NOTE — Patient Instructions (Addendum)
[facility] [place], [place] [address] [phone] (main office) [phone] [facility] office)  New Parent Support Groups [email or web address] [email or web address]  [address] [place], [place] [address] [phone] or [phone] [facility] 24/7 FOR ANYONE [address] [place], [place] Fax: [phone] [email or web address] *Interpreters available *Accepts all insurance and uninsured for Urgent Care needs *Accepts Medicaid and uninsured for outpatient treatment (below)    ONLY FOR [facility]  Below:   Outpatient New Patient Assessment/Therapy Walk-ins:        Monday -Thursday 8am until slots are full.        Every Friday 1pm-4pm  (first come, first served)                   New Patient Psychiatry/Medication Management        Monday-Friday 8am-11am (first come, first served)              For all walk-ins we ask that you arrive by 7:15am, because patients will be seen in the order of arrival.

## 2022-04-30 NOTE — BH Specialist Note (Signed)
Pt did not arrive to video visit and did not answer the phone; Unable to leave voice message as phone is out of service; left MyChart message for patient.

## 2022-05-14 ENCOUNTER — Ambulatory Visit: Payer: Medicaid Other | Admitting: Clinical

## 2022-05-14 DIAGNOSIS — Z91199 Patient's noncompliance with other medical treatment and regimen due to unspecified reason: Secondary | ICD-10-CM

## 2022-05-27 ENCOUNTER — Ambulatory Visit: Payer: Medicaid Other | Admitting: Family Medicine

## 2022-05-27 NOTE — Progress Notes (Deleted)
    [place] Partum Visit Note  Alyssa Hartman is a 27 y.o. [phone] female who presents for a postpartum visit. She is  11  weeks postpartum following a primary cesarean section.  I have fully reviewed the prenatal and intrapartum course. The delivery was at 37.1 gestational weeks.  Anesthesia: spinal. Postpartum course has been ***. Baby is doing well***. Baby is feeding by breast. Bleeding {vag bleed:12292}. Bowel function is {normal:32111}. Bladder function is {normal:32111}. Patient {is/is not:9024} sexually active. Contraception method is tubal ligation. Postpartum depression screening: {gen negative/positive:315881}.   The pregnancy intention screening data noted above was reviewed. Potential methods of contraception were discussed. The patient elected to proceed with No data recorded.    Health Maintenance Due  Topic Date Due   COVID-19 Vaccine (1) Never done   FOOT EXAM  Never done   OPHTHALMOLOGY EXAM  Never done   HPV VACCINES (2 - 3-dose series) 11/05/2012   HEMOGLOBIN A1C  04/04/2022   PAP-Cervical Cytology Screening  04/21/2022   PAP SMEAR-Modifier  04/21/2022    {Common ambulatory SmartLinks:19316}  Review of Systems {ros; complete:30496}  Objective:  LMP 06/24/2021    General:  {gen appearance:16600}   Breasts:  {desc; normal/abnormal/not indicated:14647}  Lungs: {lung exam:16931}  Heart:  {heart exam:5510}  Abdomen: {abdomen exam:16834}   Wound {Wound assessment:11097}  GU exam:  {desc; normal/abnormal/not indicated:14647}       Assessment:    There are no diagnoses linked to this encounter.  *** postpartum exam.   Plan:   Essential components of care per ACOG recommendations:  1.  Mood and well being: Patient with {gen negative/positive:315881} depression screening today. Reviewed local resources for support.  - Patient tobacco use? {tobacco use:25506}  - hx of drug use? {yes/no:25505}    2. Infant care and feeding:  -Patient currently breastmilk  feeding? {yes/no:25502}  -Social determinants of health (SDOH) reviewed in EPIC. No concerns***The following needs were identified***  3. Sexuality, contraception and birth spacing - Patient {DOES_DOES JZ:[id number] want a pregnancy in the next year.  Desired family size is {NUMBER 1-10:22536} children.  - Reviewed reproductive life planning. Reviewed contraceptive methods based on pt preferences and effectiveness.  Patient desired {Up[employer] End Methods:24109} today.   - Discussed birth spacing of 18 months  4. Sleep and fatigue -Encouraged family/partner/community support of 4 hrs of uninterrupted sleep to help with mood and fatigue  5. Physical Recovery  - Discussed patients delivery and complications. She describes her labor as {description:25511} - Patient had a {CHL AMB DELIVERY:[phone]}. Patient had a {laceration:25518} laceration. Perineal healing reviewed. Patient expressed understanding - Patient has urinary incontinence? {yes/no:25515} - Patient {ACTION; IS/IS GI:087931 safe to resume physical and sexual activity  6.  Health Maintenance - HM due items addressed {Yes or If no, why not?:20788} - Last pap smear  Diagnosis  Date Value Ref Range Status  04/22/2019   Final   - Negative for intraepithelial lesion or malignancy (NILM)   Pap smear {done:10129} at today's visit.  -Breast Cancer screening indicated? {indicated:25516}  7. Chronic Disease/Pregnancy Condition follow up: {Follow up:25499}  - PCP follow up  [facility] [place]

## 2022-09-04 ENCOUNTER — Telehealth: Payer: Self-pay | Admitting: Family Medicine

## 2022-09-04 NOTE — Telephone Encounter (Signed)
Called Barbara Cower from the state [facility] back who asked about patient's last repeat rpr and t pall. Informed him of negative t pall 1/16. He verbalized understanding and had no questions.

## 2022-09-04 NOTE — Telephone Encounter (Signed)
Barbara Cower from the [facility] Dept called in regards to this patient " PPR"   his number is [phone]
